# Patient Record
Sex: Female | Born: 1955 | Race: Black or African American | Hispanic: No | Marital: Married | Smoking: Former smoker
Health system: Southern US, Community
[De-identification: ages and names within clinical notes are randomized; demographics above are authoritative.]

## PROBLEM LIST (undated history)

## (undated) DIAGNOSIS — E669 Obesity, unspecified: Secondary | ICD-10-CM

## (undated) DIAGNOSIS — R9389 Abnormal findings on diagnostic imaging of other specified body structures: Secondary | ICD-10-CM

## (undated) DIAGNOSIS — I10 Essential (primary) hypertension: Secondary | ICD-10-CM

## (undated) DIAGNOSIS — E785 Hyperlipidemia, unspecified: Secondary | ICD-10-CM

## (undated) DIAGNOSIS — M1990 Unspecified osteoarthritis, unspecified site: Secondary | ICD-10-CM

## (undated) DIAGNOSIS — G500 Trigeminal neuralgia: Secondary | ICD-10-CM

## (undated) HISTORY — DX: Hyperlipidemia, unspecified: E78.5

## (undated) HISTORY — DX: Abnormal findings on diagnostic imaging of other specified body structures: R93.89

## (undated) HISTORY — DX: Trigeminal neuralgia: G50.0

## (undated) HISTORY — DX: Obesity, unspecified: E66.9

## (undated) HISTORY — DX: Essential (primary) hypertension: I10

## (undated) HISTORY — PX: ABDOMINAL HYSTERECTOMY: SHX81

## (undated) HISTORY — PX: COLONOSCOPY: SHX174

## (undated) HISTORY — DX: Unspecified osteoarthritis, unspecified site: M19.90

---

## 1998-09-04 ENCOUNTER — Emergency Department (HOSPITAL_COMMUNITY): Admission: EM | Admit: 1998-09-04 | Discharge: 1998-09-04 | Payer: Self-pay | Admitting: Emergency Medicine

## 1999-05-31 ENCOUNTER — Inpatient Hospital Stay (HOSPITAL_COMMUNITY): Admission: RE | Admit: 1999-05-31 | Discharge: 1999-06-02 | Payer: Self-pay | Admitting: Obstetrics and Gynecology

## 1999-05-31 ENCOUNTER — Encounter (INDEPENDENT_AMBULATORY_CARE_PROVIDER_SITE_OTHER): Payer: Self-pay

## 2003-08-02 ENCOUNTER — Encounter: Payer: Self-pay | Admitting: Internal Medicine

## 2003-08-02 ENCOUNTER — Ambulatory Visit (HOSPITAL_COMMUNITY): Admission: RE | Admit: 2003-08-02 | Discharge: 2003-08-02 | Payer: Self-pay | Admitting: Occupational Therapy

## 2004-06-20 ENCOUNTER — Ambulatory Visit (HOSPITAL_COMMUNITY): Admission: RE | Admit: 2004-06-20 | Discharge: 2004-06-20 | Payer: Self-pay | Admitting: Internal Medicine

## 2004-12-18 ENCOUNTER — Ambulatory Visit: Payer: Self-pay | Admitting: Family Medicine

## 2004-12-20 ENCOUNTER — Ambulatory Visit: Payer: Self-pay | Admitting: Family Medicine

## 2006-06-05 ENCOUNTER — Ambulatory Visit (HOSPITAL_COMMUNITY): Admission: RE | Admit: 2006-06-05 | Discharge: 2006-06-05 | Payer: Self-pay | Admitting: *Deleted

## 2010-02-08 ENCOUNTER — Encounter: Admission: RE | Admit: 2010-02-08 | Discharge: 2010-02-08 | Payer: Self-pay | Admitting: Emergency Medicine

## 2010-05-02 ENCOUNTER — Encounter: Admission: RE | Admit: 2010-05-02 | Discharge: 2010-05-02 | Payer: Self-pay | Admitting: Family Medicine

## 2011-08-06 LAB — BASIC METABOLIC PANEL
Creatinine: 0.7 mg/dL (ref <=1.1)
Potassium: 4.1 mmol/L (ref 3.4–5.3)

## 2011-08-06 LAB — LIPID PANEL
LDl/HDL Ratio: 2.7
Triglycerides: 77 mg/dL (ref 40–160)

## 2011-08-06 LAB — HEPATIC FUNCTION PANEL
AST: 19 U/L (ref 13–35)
Alkaline Phosphatase: 71 U/L (ref 25–125)
Bilirubin, Total: 0.7 mg/dL

## 2011-11-24 ENCOUNTER — Encounter: Payer: Self-pay | Admitting: *Deleted

## 2011-11-24 DIAGNOSIS — E785 Hyperlipidemia, unspecified: Secondary | ICD-10-CM

## 2011-11-24 DIAGNOSIS — E669 Obesity, unspecified: Secondary | ICD-10-CM

## 2011-11-24 DIAGNOSIS — I10 Essential (primary) hypertension: Secondary | ICD-10-CM

## 2011-11-26 ENCOUNTER — Encounter: Payer: Self-pay | Admitting: *Deleted

## 2011-12-11 ENCOUNTER — Telehealth: Payer: Self-pay | Admitting: Emergency Medicine

## 2011-12-11 NOTE — Telephone Encounter (Signed)
This encounter was created in error - please disregard.

## 2011-12-11 NOTE — Telephone Encounter (Signed)
Addended by: Morrell Riddle on: 12/11/2011 09:40 PM   Modules accepted: Level of Service, SmartSet

## 2011-12-18 ENCOUNTER — Ambulatory Visit (INDEPENDENT_AMBULATORY_CARE_PROVIDER_SITE_OTHER): Payer: 59 | Admitting: Emergency Medicine

## 2011-12-18 ENCOUNTER — Ambulatory Visit: Payer: 59

## 2011-12-18 ENCOUNTER — Encounter: Payer: Self-pay | Admitting: Emergency Medicine

## 2011-12-18 VITALS — BP 150/81 | HR 69 | Temp 98.1°F | Resp 16 | Ht 64.0 in | Wt 194.8 lb

## 2011-12-18 DIAGNOSIS — M549 Dorsalgia, unspecified: Secondary | ICD-10-CM

## 2011-12-18 DIAGNOSIS — E782 Mixed hyperlipidemia: Secondary | ICD-10-CM

## 2011-12-18 DIAGNOSIS — R9431 Abnormal electrocardiogram [ECG] [EKG]: Secondary | ICD-10-CM

## 2011-12-18 DIAGNOSIS — Z0000 Encounter for general adult medical examination without abnormal findings: Secondary | ICD-10-CM

## 2011-12-18 DIAGNOSIS — M545 Low back pain, unspecified: Secondary | ICD-10-CM

## 2011-12-18 DIAGNOSIS — E785 Hyperlipidemia, unspecified: Secondary | ICD-10-CM

## 2011-12-18 DIAGNOSIS — M47819 Spondylosis without myelopathy or radiculopathy, site unspecified: Secondary | ICD-10-CM

## 2011-12-18 DIAGNOSIS — I10 Essential (primary) hypertension: Secondary | ICD-10-CM

## 2011-12-18 LAB — COMPREHENSIVE METABOLIC PANEL
AST: 19 U/L (ref 0–37)
Albumin: 4.5 g/dL (ref 3.5–5.2)
BUN: 7 mg/dL (ref 6–23)
Calcium: 10 mg/dL (ref 8.4–10.5)
Chloride: 103 mEq/L (ref 96–112)
Potassium: 4.2 mEq/L (ref 3.5–5.3)
Sodium: 140 mEq/L (ref 135–145)
Total Protein: 7.3 g/dL (ref 6.0–8.3)

## 2011-12-18 LAB — CBC
HCT: 39.8 % (ref 36.0–46.0)
Hemoglobin: 12.8 g/dL (ref 12.0–15.0)
RDW: 14.4 % (ref 11.5–15.5)
WBC: 4.8 10*3/uL (ref 4.0–10.5)

## 2011-12-18 LAB — LIPID PANEL
HDL: 50 mg/dL (ref >39)
LDL Cholesterol: 153 mg/dL — ABNORMAL HIGH (ref 0–99)

## 2011-12-18 NOTE — Progress Notes (Signed)
  Subjective:    Patient ID: April Saunders, female    DOB: [birth year], 56 y.o.   MRN: [id number]  HPI patient presents for her yearly physical. 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 She has no other specific complaint     Objective:   Physical Exam her HEENT exam is within normal limits. Her neck supple. Her chest is clear to auscultation and percussion. Her cardiac exam reveals a regular rate and rhythm without murmurs rubs or gallops the her abdomen is soft there no tenderness or masses. Her back examination was tenderness over the lumbar spinet on but the tendon reflexes and motor strength is symmetrical her pulses in the lower extremities are normal. There is no swelling.Marland Kitchen   EKG was performed and showed a normal sinus rhythm with T. inversion in V1 to V3 no change from previous. 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     Assessment & Plan:  Patient has been out of her blood pressure medication last few days I suspect this is a reasonable pressure slightly elevated today. She does have T-wave changes in V1 to V3 and will refer to cardiology for evaluation of these abnormalities

## 2011-12-18 NOTE — Patient Instructions (Addendum)
Back Exercises Back exercises help treat and prevent back injuries. The goal of back exercises is to increase the strength of your abdominal and back muscles and the flexibility of your back. These exercises should be started when you no longer have back pain. Back exercises include:  Pelvic Tilt. Lie on your back with your knees bent. Tilt your pelvis until the lower part of your back is against the floor. Hold this position 5 to 10 sec and repeat 5 to 10 times.   Knee to Chest. Pull first 1 knee up against your chest and hold for 20 to 30 seconds, repeat this with the other knee, and then both knees. This may be done with the other leg straight or bent, whichever feels better.   Sit-Ups or Curl-Ups. Bend your knees 90 degrees. Start with tilting your pelvis, and do a partial, slow sit-up, lifting your trunk only 30 to 45 degrees off the floor. Take at least 2 to 3 seconds for each sit-up. Do not do sit-ups with your knees out straight. If partial sit-ups are difficult, simply do the above but with only tightening your abdominal muscles and holding it as directed.   Hip-Lift. Lie on your back with your knees flexed 90 degrees. Push down with your feet and shoulders as you raise your hips a couple inches off the floor; hold for 10 seconds, repeat 5 to 10 times.   Back arches. Lie on your stomach, propping yourself up on bent elbows. Slowly press on your hands, causing an arch in your low back. Repeat 3 to 5 times. Any initial stiffness and discomfort should lessen with repetition over time.   Shoulder-Lifts. Lie face down with arms beside your body. Keep hips and torso pressed to floor as you slowly lift your head and shoulders off the floor.  Do not overdo your exercises, especially in the beginning. Exercises may cause you some mild back discomfort which lasts for a few minutes; however, if the pain is more severe, or lasts for more than 15 minutes, do not continue exercises until you see your  caregiver. Improvement with exercise therapy for back problems is slow.  See your caregivers for assistance with developing a proper back exercise program. Document Released: 12/06/2004 Document Revised: 06/27/2011 Document Reviewed: 10/29/2005 [facility] Patient Information 2012 [place], [place].Hypertension As your heart beats, it forces blood through your arteries. This force is your blood pressure. If the pressure is too high, it is called hypertension (HTN) or high blood pressure. HTN is dangerous because you may have it and not know it. High blood pressure may mean that your heart has to work harder to pump blood. Your arteries may be narrow or stiff. The extra work puts you at risk for heart disease, stroke, and other problems.  Blood pressure consists of two numbers, a higher number over a lower, 110/72, for example. It is stated as "110 over 72." The ideal is below 120 for the top number (systolic) and under 80 for the bottom (diastolic). Write down your blood pressure today. You should pay close attention to your blood pressure if you have certain conditions such as:  Heart failure.   Prior heart attack.   Diabetes   Chronic kidney disease.   Prior stroke.   Multiple risk factors for heart disease.  To see if you have HTN, your blood pressure should be measured while you are seated with your arm held at the level of the heart. It should be measured at least twice.  A one-time elevated blood pressure reading (especially in the Emergency Department) does not mean that you need treatment. There may be conditions in which the blood pressure is different between your right and left arms. It is important to see your caregiver soon for a recheck. Most people have essential hypertension which means that there is not a specific cause. This type of high blood pressure may be lowered by changing lifestyle factors such as:  Stress.   Smoking.   Lack of exercise.   Excessive weight.    Drug/tobacco/alcohol use.   Eating less salt.  Most people do not have symptoms from high blood pressure until it has caused damage to the body. Effective treatment can often prevent, delay or reduce that damage. TREATMENT  When a cause has been identified, treatment for high blood pressure is directed at the cause. There are a large number of medications to treat HTN. These fall into several categories, and your caregiver will help you select the medicines that are best for you. Medications may have side effects. You should review side effects with your caregiver. If your blood pressure stays high after you have made lifestyle changes or started on medicines,   Your medication(s) may need to be changed.   Other problems may need to be addressed.   Be certain you understand your prescriptions, and know how and when to take your medicine.   Be sure to follow up with your caregiver within the time frame advised (usually within two weeks) to have your blood pressure rechecked and to review your medications.   If you are taking more than one medicine to lower your blood pressure, make sure you know how and at what times they should be taken. Taking two medicines at the same time can result in blood pressure that is too low.  SEEK IMMEDIATE MEDICAL CARE IF:  You develop a severe headache, blurred or changing vision, or confusion.   You have unusual weakness or numbness, or a faint feeling.   You have severe chest or abdominal pain, vomiting, or breathing problems.  MAKE SURE YOU:   Understand these instructions.   Will watch your condition.   Will get help right away if you are not doing well or get worse.  Document Released: 10/29/2005 Document Revised: 07/11/2011 Document Reviewed: 06/18/2008 [facility] Patient Information 2012 [place], [place].Cholesterol Cholesterol is a white, waxy, fat-like protein needed by your body in small amounts. The liver makes all the cholesterol you need.  It is carried from the liver by the blood through the blood vessels. Deposits (plaque) may build up on blood vessel walls. This makes the arteries narrower and stiffer. Plaque increases the risk for heart attack and stroke. You cannot feel your cholesterol level even if it is very high. The only way to know is by a blood test to check your lipid (fats) levels. Once you know your cholesterol levels, you should keep a record of the test results. Work with your caregiver to to keep your levels in the desired range. WHAT THE RESULTS MEAN:  Total cholesterol is a rough measure of all the cholesterol in your blood.   LDL is the so-called bad cholesterol. This is the type that deposits cholesterol in the walls of the arteries. You want this level to be low.   HDL is the good cholesterol because it cleans the arteries and carries the LDL away. You want this level to be high.   Triglycerides are fat that the body can either  burn for energy or store. High levels are closely linked to heart disease.  DESIRED LEVELS:  Total cholesterol below 200.   LDL below 100 for people at risk, below 70 for very high risk.   HDL above 50 is good, above 60 is best.   Triglycerides below 150.  HOW TO LOWER YOUR CHOLESTEROL:  Diet.   Choose fish or white meat chicken and [place], roasted or baked. Limit fatty cuts of red meat, fried foods, and processed meats, such as sausage and lunch meat.   Eat lots of fresh fruits and vegetables. Choose whole grains, beans, pasta, potatoes and cereals.   Use only small amounts of olive, corn or canola oils. Avoid butter, mayonnaise, shortening or palm kernel oils. Avoid foods with trans-fats.   Use skim/nonfat milk and low-fat/nonfat yogurt and cheeses. Avoid whole milk, cream, ice cream, egg yolks and cheeses. Healthy desserts include angel food cake, ginger snaps, animal crackers, hard candy, popsicles, and low-fat/nonfat frozen yogurt. Avoid pastries, cakes, pies and  cookies.   Exercise.   A regular program helps decrease LDL and raises HDL.   Helps with weight control.   Do things that increase your activity level like gardening, walking, or taking the stairs.   Medication.   May be prescribed by your caregiver to help lowering cholesterol and the risk for heart disease.   You may need medicine even if your levels are normal if you have several risk factors.  HOME CARE INSTRUCTIONS   Follow your diet and exercise programs as suggested by your caregiver.   Take medications as directed.   Have blood work done when your caregiver feels it is necessary.  MAKE SURE YOU:   Understand these instructions.   Will watch your condition.   Will get help right away if you are not doing well or get worse.  Document Released: 07/24/2001 Document Revised: 07/11/2011 Document Reviewed: 01/14/2008 [facility] Patient Information 2012 [place], [place].

## 2011-12-31 ENCOUNTER — Encounter: Payer: Self-pay | Admitting: Cardiovascular Disease

## 2011-12-31 ENCOUNTER — Ambulatory Visit (INDEPENDENT_AMBULATORY_CARE_PROVIDER_SITE_OTHER): Payer: 59 | Admitting: Cardiovascular Disease

## 2011-12-31 VITALS — BP 132/85 | HR 89 | Resp 12 | Ht 64.0 in | Wt 199.8 lb

## 2011-12-31 DIAGNOSIS — I10 Essential (primary) hypertension: Secondary | ICD-10-CM

## 2011-12-31 DIAGNOSIS — E785 Hyperlipidemia, unspecified: Secondary | ICD-10-CM

## 2011-12-31 DIAGNOSIS — R9431 Abnormal electrocardiogram [ECG] [EKG]: Secondary | ICD-10-CM | POA: Insufficient documentation

## 2011-12-31 MED ORDER — SIMVASTATIN 40 MG PO TABS: ORAL_TABLET | ORAL | Status: DC

## 2011-12-31 NOTE — Assessment & Plan Note (Signed)
Her current dose of Simvastatin is too high when used in combination with amlodipine.  We will decrease her dose to 20 mg .  She will follow up with Dr. Cleta Alberts.

## 2011-12-31 NOTE — Progress Notes (Signed)
    April Saunders Date of Birth  06-May-1956 [facility]     [place] Office  [address]    [address]   [address] [place], [place]  [address]    [place], [place]  [address] [phone]  Fax  [phone]  [phone]  Fax [phone]   History of Present Illness:  April Saunders is a 56 yo with a hx of HTN.  She has no cardiac complaints.  She was noticed to have an abnormal ECG and was referred over for further evaluation.  She denies any chest pain or dyspnea.  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  She is on her feet and moving 8 hours a day and has not cardiac limitations.   Current Outpatient Prescriptions  Medication Sig Dispense Refill  . amLODipine (NORVASC) 5 MG tablet Take 1 tablet (5 mg total) by mouth daily.  30 tablet  2  . Cod Liver Oil CAPS Take 1 capsule by mouth daily.      . fish oil-omega-3 fatty acids 1000 MG capsule Take 1 g by mouth daily.      . simvastatin (ZOCOR) 40 MG tablet One half tab every day  30 tablet       Allergies  Allergen Reactions  . Lisinopril Other (See Comments)    Shoulder pain    Past Medical History  Diagnosis Date  . Hypertension   . Obesity   . Hyperlipidemia   . Trigeminal neuralgia   . Lipoma     rt ankle  . DJD (degenerative joint disease)     multiple joints  . Abnormal MRI     brain    Past Surgical History  Procedure Date  . Abdominal hysterectomy     ? ovaries    History  Smoking status  . Former Smoker  Smokeless tobacco  . Not on file  Comment: 2008    History  Alcohol Use No    No family history on file.  Reviw of Systems:  Reviewed in the HPI.  All other systems are negative.  Physical Exam: Blood pressure 132/85, pulse 89, resp. rate 12, height 5\' 4"  (1.626 m), weight 199 lb 12.8 oz (90.629 kg). General: Well developed, well nourished, in no acute distress.  Head: Normocephalic, atraumatic, sclera non-icteric, mucus membranes are moist,   Neck: Supple. Negative  for carotid bruits. JVD not elevated.  Lungs: Clear bilaterally to auscultation without wheezes, rales, or rhonchi. Breathing is unlabored.  Heart: RRR with S1 S2. No murmurs, rubs, or gallops appreciated.  Abdomen: Soft, non-tender, non-distended with normoactive bowel sounds. No hepatomegaly. No rebound/guarding. No obvious abdominal masses.  Msk:  Strength and tone appear normal for age.  Extremities: No clubbing or cyanosis. No edema.  Distal pedal pulses are 2+ and equal bilaterally.  Neuro: Alert and oriented X 3. Moves all extremities spontaneously.  Psych:  Responds to questions appropriately with a normal affect.  ECG: NSR. Nonspecific T wave inversion in V1-V2 with T wave flattening in V3  Assessment / Plan:

## 2011-12-31 NOTE — Patient Instructions (Signed)
Decrease Simvastatin to 20mg  every day.  Your physician recommends that you schedule a follow-up appointment as needed.

## 2011-12-31 NOTE — Assessment & Plan Note (Signed)
April Saunders presents without any symptoms and was found to have non specific T wave changes on her resting ECG.  She has a hx of HTN and had not taken her meds for the several days prior to that ECG.    At this point I do not think any further testing is warrented.  She has no symptoms and does not really want to have any further testing.  I have asked her to walk every day and to call me if she develops any symptoms.

## 2012-03-18 ENCOUNTER — Other Ambulatory Visit: Payer: Self-pay | Admitting: Physician Assistant

## 2012-04-15 ENCOUNTER — Ambulatory Visit (INDEPENDENT_AMBULATORY_CARE_PROVIDER_SITE_OTHER): Payer: 59 | Admitting: Emergency Medicine

## 2012-04-15 VITALS — BP 137/88 | HR 80 | Temp 98.1°F | Resp 20 | Ht 64.0 in | Wt 194.6 lb

## 2012-04-15 DIAGNOSIS — E785 Hyperlipidemia, unspecified: Secondary | ICD-10-CM

## 2012-04-15 DIAGNOSIS — R059 Cough, unspecified: Secondary | ICD-10-CM

## 2012-04-15 DIAGNOSIS — R739 Hyperglycemia, unspecified: Secondary | ICD-10-CM

## 2012-04-15 DIAGNOSIS — I10 Essential (primary) hypertension: Secondary | ICD-10-CM

## 2012-04-15 DIAGNOSIS — R7989 Other specified abnormal findings of blood chemistry: Secondary | ICD-10-CM

## 2012-04-15 DIAGNOSIS — R05 Cough: Secondary | ICD-10-CM

## 2012-04-15 LAB — LIPID PANEL
LDL Cholesterol: 106 mg/dL — ABNORMAL HIGH (ref 0–99)
Triglycerides: 92 mg/dL (ref <150)

## 2012-04-15 LAB — GLUCOSE, POCT (MANUAL RESULT ENTRY): POC Glucose: 109 mg/dl — AB (ref 70–99)

## 2012-04-15 MED ORDER — BENZONATATE 200 MG PO CAPS: 200.0000 mg | ORAL_CAPSULE | Freq: Three times a day (TID) | ORAL | Status: AC | PRN

## 2012-04-15 NOTE — Patient Instructions (Signed)
Please take Claritin 10 mg one a day. Please pick up the [facility] and use for your cough. No change in medicines at present.

## 2012-04-15 NOTE — Progress Notes (Signed)
  Subjective:    Patient ID: April Saunders, female    DOB: 1955/12/31, 56 y.o.   MRN: [id number]  HPI April Saunders enters today for followup. She's been doing well without complaints except one week ago she developed a cold. She's had a dry persistent cough since that time. She has had no sputum production but has a raspy voice. She has no reflux complaints.    Review of Systems chest no chest pain shortness of breath or other new complaints.     Objective:   Physical Exam  Constitutional: She appears well-developed.  HENT:  Head: Normocephalic and atraumatic.  Eyes: Pupils are equal, round, and reactive to light.  Neck: Normal range of motion. No tracheal deviation present. No thyromegaly present.  Cardiovascular: Normal rate and regular rhythm.  Exam reveals no gallop and no friction rub.   No murmur heard. Pulmonary/Chest: Effort normal and breath sounds normal. No respiratory distress. She has no wheezes. She has no rales.  Lymphadenopathy:    She has no cervical adenopathy.          Assessment & Plan:  Patient is stable at present. No change in medications. We'll try Tessalon Perles and Claritin to treat her cough. She had no complaints of reflux the

## 2012-04-17 ENCOUNTER — Telehealth: Payer: Self-pay

## 2012-07-02 ENCOUNTER — Other Ambulatory Visit: Payer: Self-pay | Admitting: Family Medicine

## 2012-07-02 MED ORDER — AMLODIPINE BESYLATE 5 MG PO TABS: 5.0000 mg | ORAL_TABLET | Freq: Every day | ORAL | Status: DC

## 2012-08-19 ENCOUNTER — Ambulatory Visit: Payer: Self-pay | Admitting: Emergency Medicine

## 2012-08-19 ENCOUNTER — Ambulatory Visit: Payer: 59 | Admitting: Emergency Medicine

## 2012-09-02 ENCOUNTER — Ambulatory Visit: Payer: 59 | Admitting: Emergency Medicine

## 2012-09-09 ENCOUNTER — Other Ambulatory Visit: Payer: Self-pay | Admitting: *Deleted

## 2012-09-09 MED ORDER — AMLODIPINE BESYLATE 5 MG PO TABS: 5.0000 mg | ORAL_TABLET | Freq: Every day | ORAL | Status: DC

## 2012-09-16 ENCOUNTER — Ambulatory Visit: Payer: Self-pay | Admitting: Emergency Medicine

## 2012-10-17 ENCOUNTER — Other Ambulatory Visit: Payer: Self-pay | Admitting: Physician Assistant

## 2012-11-06 ENCOUNTER — Other Ambulatory Visit: Payer: Self-pay | Admitting: Emergency Medicine

## 2012-11-22 ENCOUNTER — Ambulatory Visit (INDEPENDENT_AMBULATORY_CARE_PROVIDER_SITE_OTHER): Payer: BC Managed Care – HMO | Admitting: Emergency Medicine

## 2012-11-22 VITALS — BP 155/82 | HR 86 | Temp 98.5°F | Resp 17 | Ht 64.5 in | Wt 195.0 lb

## 2012-11-22 DIAGNOSIS — E785 Hyperlipidemia, unspecified: Secondary | ICD-10-CM

## 2012-11-22 DIAGNOSIS — I10 Essential (primary) hypertension: Secondary | ICD-10-CM

## 2012-11-22 DIAGNOSIS — E782 Mixed hyperlipidemia: Secondary | ICD-10-CM

## 2012-11-22 DIAGNOSIS — Z23 Encounter for immunization: Secondary | ICD-10-CM

## 2012-11-22 LAB — POCT CBC
Lymph, poc: 2.4 (ref 0.6–3.4)
MCHC: 30.8 g/dL — AB (ref 31.8–35.4)
MID (cbc): 0.4 (ref 0–0.9)
MPV: 9 fL (ref 0–99.8)
POC Granulocyte: 3 (ref 2–6.9)
POC LYMPH PERCENT: 41.6 %L (ref 10–50)
POC MID %: 6.4 %M (ref 0–12)
RDW, POC: 15.4 %

## 2012-11-22 LAB — COMPREHENSIVE METABOLIC PANEL
ALT: 14 U/L (ref 0–35)
Alkaline Phosphatase: 83 U/L (ref 39–117)
Sodium: 138 mEq/L (ref 135–145)
Total Bilirubin: 0.9 mg/dL (ref 0.3–1.2)
Total Protein: 7.5 g/dL (ref 6.0–8.3)

## 2012-11-22 LAB — LIPID PANEL
LDL Cholesterol: 174 mg/dL — ABNORMAL HIGH (ref 0–99)
Total CHOL/HDL Ratio: 4.6 Ratio
Triglycerides: 132 mg/dL (ref <150)
VLDL: 26 mg/dL (ref 0–40)

## 2012-11-22 LAB — POCT UA - MICROSCOPIC ONLY
Bacteria, U Microscopic: NEGATIVE
Casts, Ur, LPF, POC: NEGATIVE
Mucus, UA: NEGATIVE

## 2012-11-22 LAB — POCT URINALYSIS DIPSTICK
Bilirubin, UA: NEGATIVE
Blood, UA: NEGATIVE
Glucose, UA: NEGATIVE
Spec Grav, UA: 1.015

## 2012-11-22 MED ORDER — AMLODIPINE BESYLATE 5 MG PO TABS: 5.0000 mg | ORAL_TABLET | Freq: Every day | ORAL | Status: DC

## 2012-11-22 MED ORDER — INFLUENZA VIRUS VACCINE SPLIT IM INJ: 0.5000 mL | INJECTION | Freq: Once | INTRAMUSCULAR | Status: DC

## 2012-11-22 NOTE — Progress Notes (Signed)
[facility] [address] [place] [address] [phone]  Date:  11/22/2012   Name:  April Saunders   DOB:  12/13/1955   MRN:  [id number]  PCP:  Lucilla Edin, MD    Chief Complaint: Medication Refill and Immunizations   History of Present Illness:  April Saunders is a 57 y.o. very pleasant female patient who presents with the following:  Here for refill on meds and labs.  Had mammogram done in December.  Has no acute health concerns currently other than need for influenza immunization.  Not due for colonoscopy until age 22.  Out of statin for two weeks took last dose of BP med last night.  Patient Active Problem List  Diagnosis  . Hypertension  . Obesity  . Hyperlipidemia  . Arthritis, low back  . Abnormal ECG    Past Medical History  Diagnosis Date  . Hypertension   . Obesity   . Hyperlipidemia   . Trigeminal neuralgia   . Lipoma     rt ankle  . DJD (degenerative joint disease)     multiple joints  . Abnormal MRI     brain    Past Surgical History  Procedure Date  . Abdominal hysterectomy     ? ovaries    History  Substance Use Topics  . Smoking status: Former Games developer  . Smokeless tobacco: Not on file     Comment: 2008  . Alcohol Use: No    No family history on file.  Allergies  Allergen Reactions  . Lisinopril Other (See Comments)    Shoulder pain    Medication list has been reviewed and updated.  Current Outpatient Prescriptions on File Prior to Visit  Medication Sig Dispense Refill  . amLODipine (NORVASC) 5 MG tablet Take 1 tablet (5 mg total) by mouth daily. Needs office visit/labs  30 tablet  0  . Cod Liver Oil CAPS Take 1 capsule by mouth daily.      . fish oil-omega-3 fatty acids 1000 MG capsule Take 1 g by mouth daily.      . simvastatin (ZOCOR) 40 MG tablet One half tab every day  30 tablet      Review of Systems:  As per HPI, otherwise negative.    Physical Examination: Filed Vitals:   11/22/12 1137  BP:  155/82  Pulse: 86  Temp: 98.5 F (36.9 C)  Resp: 17   Filed Vitals:   11/22/12 1137  Height: 5' 4.5" (1.638 m)  Weight: 195 lb (88.451 kg)   Body mass index is 32.95 kg/(m^2). Ideal Body Weight: Weight in (lb) to have BMI = 25: 147.6   GEN: obese, NAD, Non-toxic, A & O x 3 HEENT: Atraumatic, Normocephalic. Neck supple. No masses, No LAD. Ears and Nose: No external deformity. CV: RRR, No M/G/R. No JVD. No thrill. No extra heart sounds. PULM: CTA B, no wheezes, crackles, rhonchi. No retractions. No resp. distress. No accessory muscle use. ABD: S, NT, ND, +BS. No rebound. No HSM. EXTR: No c/c/e NEURO Normal gait.  PSYCH: Normally interactive. Conversant. Not depressed or anxious appearing.  Calm demeanor.    Assessment and Plan: Hypertension Hyperlipidemia Follow up after labs  Carmelina Dane, MD  Results for orders placed in visit on 11/22/12  POCT UA - MICROSCOPIC ONLY      Component Value Range   WBC, Ur, HPF, POC neg     RBC, urine, microscopic neg     Bacteria, U  Microscopic neg     Mucus, UA neg     Epithelial cells, urine per micros neg     Crystals, Ur, HPF, POC neg     Casts, Ur, LPF, POC neg     Yeast, UA neg    POCT URINALYSIS DIPSTICK      Component Value Range   Color, UA yellow     Clarity, UA clear     Glucose, UA neg     Bilirubin, UA neg     Ketones, UA neg     Spec Grav, UA 1.015     Blood, UA neg     pH, UA 7.0     Protein, UA neg     Urobilinogen, UA 0.2     Nitrite, UA neg     Leukocytes, UA Negative    POCT CBC      Component Value Range   WBC 5.7  4.6 - 10.2 K/uL   Lymph, poc 2.4  0.6 - 3.4   POC LYMPH PERCENT 41.6  10 - 50 %L   MID (cbc) 0.4  0 - 0.9   POC MID % 6.4  0 - 12 %M   POC Granulocyte 3.0  2 - 6.9   Granulocyte percent 52.0  37 - 80 %G   RBC 5.32  4.04 - 5.48 M/uL   Hemoglobin 13.6  12.2 - 16.2 g/dL   HCT, POC 09.8  11.9 - 47.9 %   MCV 83.1  80 - 97 fL   MCH, POC 25.6 (*) 27 - 31.2 pg   MCHC 30.8 (*) 31.8 -  35.4 g/dL   RDW, POC 14.7     Platelet Count, POC 321  142 - 424 K/uL   MPV 9.0  0 - 99.8 fL

## 2012-11-23 MED ORDER — SIMVASTATIN 40 MG PO TABS: ORAL_TABLET | ORAL | Status: DC

## 2012-11-23 NOTE — Addendum Note (Signed)
Addended by: Carmelina Dane on: 11/23/2012 01:42 PM   Modules accepted: Orders

## 2013-01-26 ENCOUNTER — Encounter: Payer: Self-pay | Admitting: Emergency Medicine

## 2013-03-09 ENCOUNTER — Ambulatory Visit (INDEPENDENT_AMBULATORY_CARE_PROVIDER_SITE_OTHER): Payer: BC Managed Care – HMO | Admitting: Physician Assistant

## 2013-03-09 VITALS — BP 157/88 | HR 73 | Temp 98.2°F | Resp 16 | Ht 64.5 in | Wt 198.8 lb

## 2013-03-09 DIAGNOSIS — M549 Dorsalgia, unspecified: Secondary | ICD-10-CM

## 2013-03-09 DIAGNOSIS — M25559 Pain in unspecified hip: Secondary | ICD-10-CM

## 2013-03-09 DIAGNOSIS — M25552 Pain in left hip: Secondary | ICD-10-CM

## 2013-03-09 MED ORDER — CYCLOBENZAPRINE HCL 5 MG PO TABS: 5.0000 mg | ORAL_TABLET | Freq: Every day | ORAL | Status: DC

## 2013-03-09 MED ORDER — MELOXICAM 15 MG PO TABS: 15.0000 mg | ORAL_TABLET | Freq: Every day | ORAL | Status: DC

## 2013-03-09 NOTE — Progress Notes (Signed)
   [address], [place] [address]   Phone [phone]  Subjective:    Patient ID: April Saunders, female    DOB: 1956/02/15, 57 y.o.   MRN: [id number]  HPI Pt presents to clinic with hip pain for the last 3 weeks.  She only has it at night when she rolls over in bed.  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  She did not have any injury but she thinks about 3 wks ago she might have stepped wrong going up a set of steps to her apartment and landed harder than normal but she does not specifically remember that.  She has pain when she lays down and has tried Aleve (2 pills) and gets no relief.  She does not have pain down her leg and has no paresthesias.  She has known arthritis in her back but her back has not been hurting more lately in her back.  The pain seems to be in her butt muscle.   Review of Systems  Musculoskeletal: Positive for back pain and arthralgias (L hip - at night only). Negative for gait problem.  Neurological: Negative for weakness and numbness.       Objective:   Physical Exam  Vitals reviewed. Constitutional: She is oriented to person, place, and time. She appears well-developed and well-nourished.  HENT:  Head: Normocephalic and atraumatic.  Right Ear: External ear normal.  Left Ear: External ear normal.  Pulmonary/Chest: Effort normal.  Musculoskeletal:       Right hip: She exhibits tenderness. She exhibits normal strength and no bony tenderness.       Lumbar back: She exhibits normal range of motion, no tenderness, no bony tenderness and no spasm.       Legs: Pt has pain with hip rotation.  She has pain with stressing of the SI joint.  Neurological: She is alert and oriented to person, place, and time. She has normal strength and normal reflexes. No sensory deficit.  Reflex Scores:      Patellar reflexes are 2+ on the right side and 2+ on the left side.      Achilles  reflexes are 2+ on the right side and 2+ on the left side. Skin: Skin is warm and dry.  Psychiatric: She has a normal mood and affect. Her behavior is normal. Judgment and thought content normal.          Assessment & Plan:  Hip pain, acute, left - I think this might be related to position of standing during the day and her arthritis in her lumbar spine - I wonder if she is having mild sciatica vs SI joint irritation.  Pt will use heat on her back.  She will do stretches. Plan: meloxicam (MOBIC) 15 MG tablet, cyclobenzaprine (FLEXERIL) 5 MG tablet  Back pain -- long standing.  If no improvement we might want to think about sacroiliitis but due to pain only at night and only on 1 side it is less likely.    Benny Lennert PA-C 03/09/2013 9:05 PM

## 2013-04-09 IMAGING — CR DG LUMBAR SPINE 2-3V
2 series · 2 of 2 positions shown · non-contrast
Comparison: Lumbar MRI 05/02/2010.

CLINICAL DATA: Back pain.

LUMBAR SPINE - 2-3 VIEW

[left lateral]
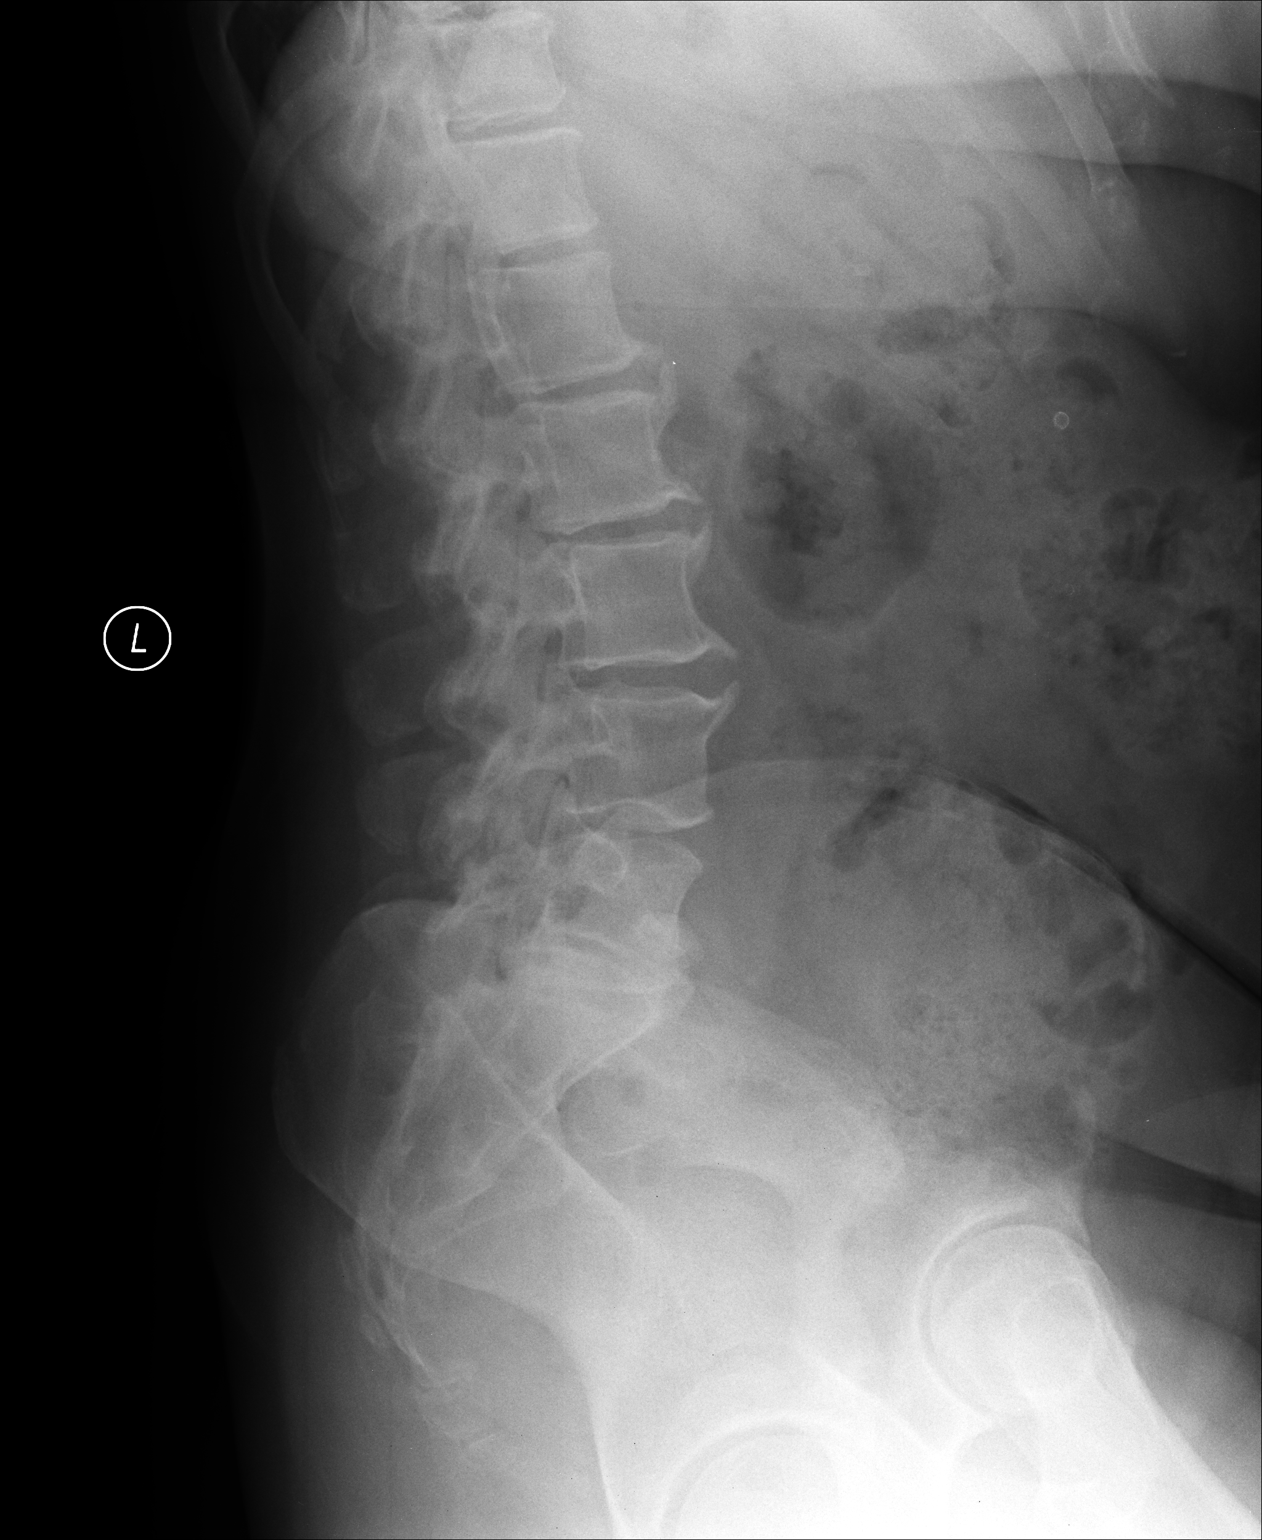

[AP]
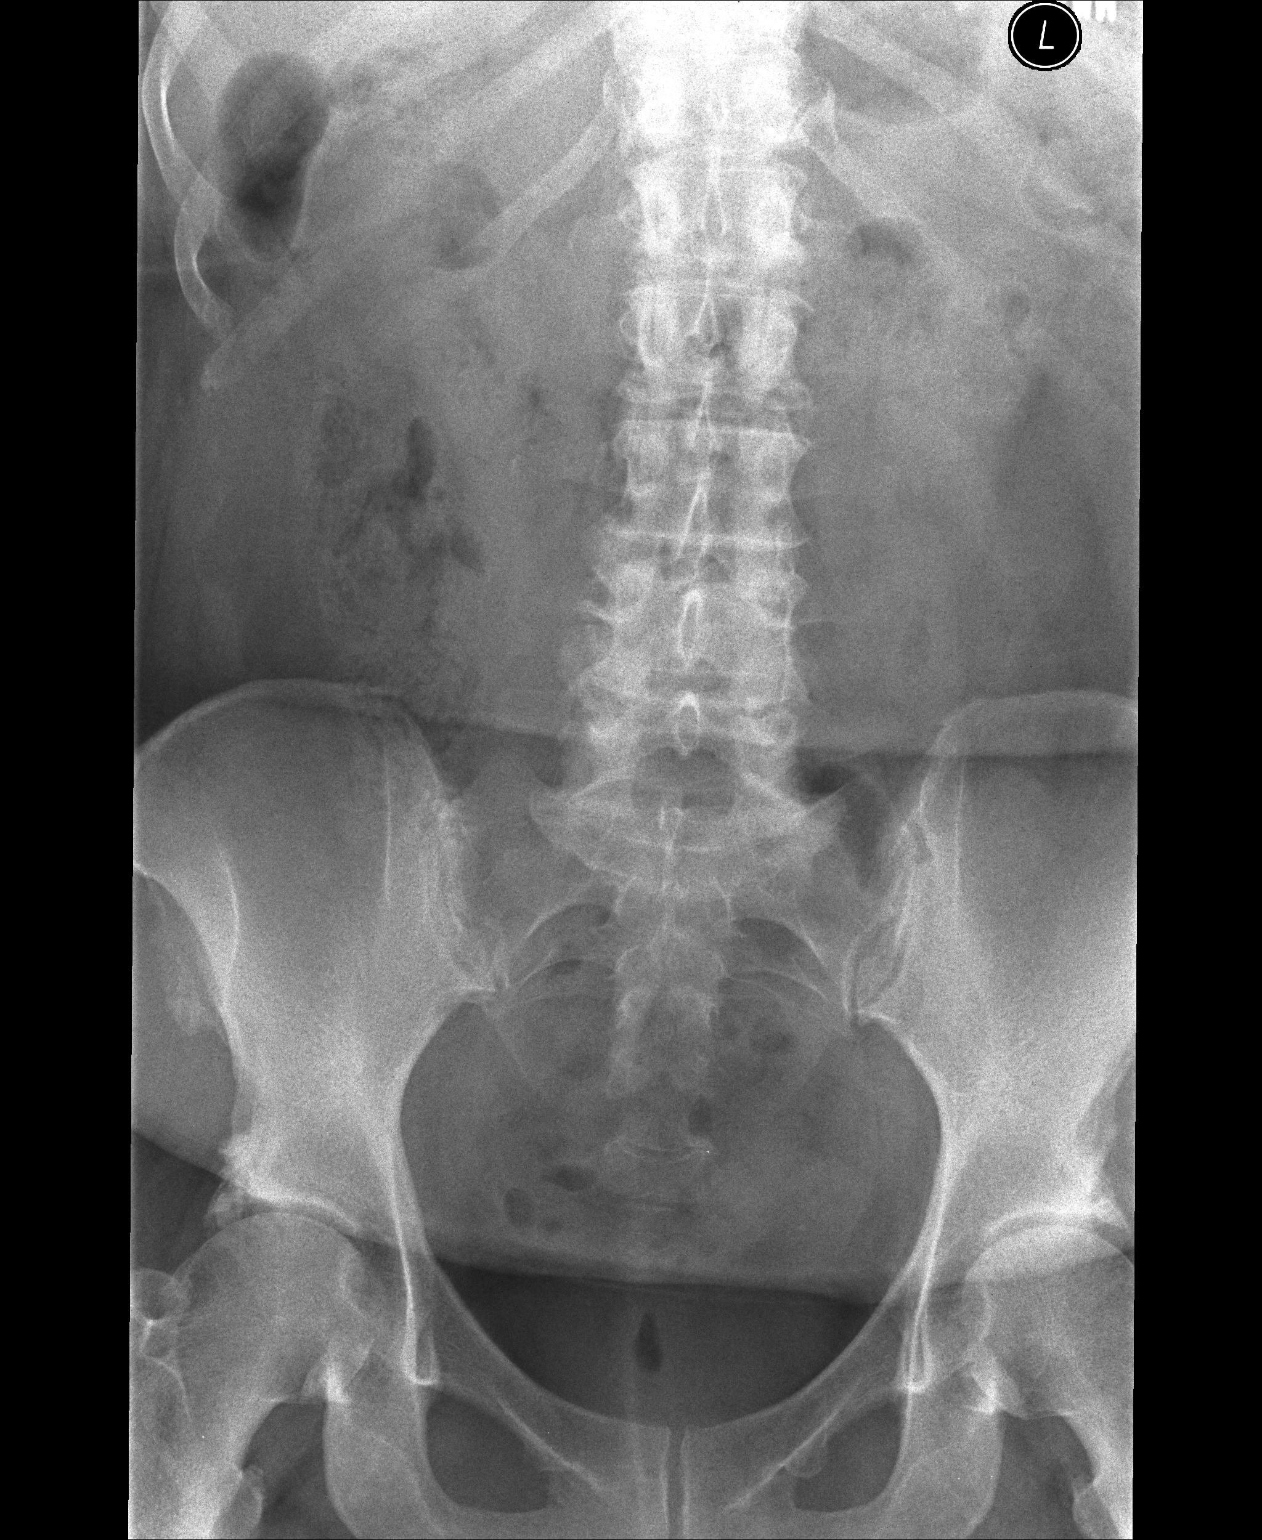

[2 of 2 positions shown; findings below may reference images not displayed]

FINDINGS: There is mild rotation on the lateral view.  There are
five lumbar type vertebral bodies which remain in normal alignment.
Disc space loss at L5-S1 and intervertebral spurring throughout the
lumbar spine are stable.  Mild facet hypertrophy is present
inferiorly.  There is no evidence of acute fracture or pars defect.
IMPRESSION: Stable lumbar spondylosis with disc space loss at L5-S1.  No acute
osseous findings or malalignment.

## 2013-06-05 ENCOUNTER — Other Ambulatory Visit: Payer: Self-pay | Admitting: Emergency Medicine

## 2013-06-14 ENCOUNTER — Ambulatory Visit (INDEPENDENT_AMBULATORY_CARE_PROVIDER_SITE_OTHER): Payer: 59 | Admitting: Internal Medicine

## 2013-06-14 ENCOUNTER — Ambulatory Visit: Payer: 59

## 2013-06-14 VITALS — BP 162/100 | HR 84 | Temp 98.1°F | Resp 16 | Ht 64.0 in | Wt 198.8 lb

## 2013-06-14 DIAGNOSIS — Z79899 Other long term (current) drug therapy: Secondary | ICD-10-CM

## 2013-06-14 DIAGNOSIS — M549 Dorsalgia, unspecified: Secondary | ICD-10-CM

## 2013-06-14 DIAGNOSIS — I10 Essential (primary) hypertension: Secondary | ICD-10-CM

## 2013-06-14 DIAGNOSIS — E785 Hyperlipidemia, unspecified: Secondary | ICD-10-CM

## 2013-06-14 LAB — POCT UA - MICROSCOPIC ONLY: Crystals, Ur, HPF, POC: NEGATIVE

## 2013-06-14 LAB — POCT URINALYSIS DIPSTICK
Blood, UA: NEGATIVE
Glucose, UA: NEGATIVE
Ketones, UA: NEGATIVE
Spec Grav, UA: >=1.03

## 2013-06-14 LAB — BASIC METABOLIC PANEL
BUN: 9 mg/dL (ref 6–23)
Calcium: 10 mg/dL (ref 8.4–10.5)
Chloride: 104 mEq/L (ref 96–112)
Creat: 0.74 mg/dL (ref 0.50–1.10)

## 2013-06-14 MED ORDER — SIMVASTATIN 40 MG PO TABS: ORAL_TABLET | ORAL | Status: DC

## 2013-06-14 MED ORDER — TRAMADOL HCL 50 MG PO TABS: 50.0000 mg | ORAL_TABLET | Freq: Three times a day (TID) | ORAL | Status: DC | PRN

## 2013-06-14 MED ORDER — METHOCARBAMOL 750 MG PO TABS: 750.0000 mg | ORAL_TABLET | Freq: Four times a day (QID) | ORAL | Status: DC

## 2013-06-14 MED ORDER — AMLODIPINE BESYLATE 5 MG PO TABS: 5.0000 mg | ORAL_TABLET | Freq: Every day | ORAL | Status: DC

## 2013-06-14 NOTE — Patient Instructions (Signed)
DASH Diet  The DASH diet stands for "Dietary Approaches to Stop Hypertension." It is a healthy eating plan that has been shown to reduce high blood pressure (hypertension) in as little as 14 days, while also possibly providing other significant health benefits. These other health benefits include reducing the risk of breast cancer after menopause and reducing the risk of type 2 diabetes, heart disease, colon cancer, and stroke. Health benefits also include weight loss and slowing kidney failure in patients with chronic kidney disease.   DIET GUIDELINES  · Limit salt (sodium). Your diet should contain less than 1500 mg of sodium daily.  · Limit refined or processed carbohydrates. Your diet should include mostly whole grains. Desserts and added sugars should be used sparingly.  · Include small amounts of heart-healthy fats. These types of fats include nuts, oils, and tub margarine. Limit saturated and trans fats. These fats have been shown to be harmful in the body.  CHOOSING FOODS   The following food groups are based on a 2000 calorie diet. See your Registered Dietitian for individual calorie needs.  Grains and Grain Products (6 to 8 servings daily)  · Eat More Often: Whole-wheat bread, brown rice, whole-grain or wheat pasta, quinoa, popcorn without added fat or salt (air popped).  · Eat Less Often: White bread, white pasta, white rice, cornbread.  Vegetables (4 to 5 servings daily)  · Eat More Often: Fresh, frozen, and canned vegetables. Vegetables may be raw, steamed, roasted, or grilled with a minimal amount of fat.  · Eat Less Often/Avoid: Creamed or fried vegetables. Vegetables in a cheese sauce.  Fruit (4 to 5 servings daily)  · Eat More Often: All fresh, canned (in natural juice), or frozen fruits. Dried fruits without added sugar. One hundred percent fruit juice (½ cup [237 mL] daily).  · Eat Less Often: Dried fruits with added sugar. Canned fruit in light or heavy syrup.  Lean Meats, Fish, and Poultry (2  servings or less daily. One serving is 3 to 4 oz [85-114 g]).  · Eat More Often: Ninety percent or leaner ground beef, tenderloin, sirloin. Round cuts of beef, chicken breast, turkey breast. All fish. Grill, bake, or broil your meat. Nothing should be fried.  · Eat Less Often/Avoid: Fatty cuts of meat, turkey, or chicken leg, thigh, or wing. Fried cuts of meat or fish.  Dairy (2 to 3 servings)  · Eat More Often: Low-fat or fat-free milk, low-fat plain or light yogurt, reduced-fat or part-skim cheese.  · Eat Less Often/Avoid: Milk (whole, 2%). Whole milk yogurt. Full-fat cheeses.  Nuts, Seeds, and Legumes (4 to 5 servings per week)  · Eat More Often: All without added salt.  · Eat Less Often/Avoid: Salted nuts and seeds, canned beans with added salt.  Fats and Sweets (limited)  · Eat More Often: Vegetable oils, tub margarines without trans fats, sugar-free gelatin. Mayonnaise and salad dressings.  · Eat Less Often/Avoid: Coconut oils, palm oils, butter, stick margarine, cream, half and half, cookies, candy, pie.  FOR MORE INFORMATION  The Dash Diet Eating Plan: www.dashdiet.org  Document Released: 10/18/2011 Document Revised: 01/21/2012 Document Reviewed: 10/18/2011  ExitCare® Patient Information ©2014 [employer].

## 2013-06-14 NOTE — Progress Notes (Signed)
  Subjective:    Patient ID: April Saunders, female    DOB: 11-Aug-1956, 57 y.o.   MRN: [id number]  HPI    Review of Systems     Objective:   Physical Exam        Assessment & Plan:

## 2013-06-14 NOTE — Progress Notes (Signed)
  Subjective:    Patient ID: April Saunders, female    DOB: Apr 27, 1956, 57 y.o.   MRN: [id number]  HPI 57 YO female patient presents today for the following:  1. Medication refills- Pt states she has been out of her blood pressure medication for the past week. She is also out of her cholesterol medication.   2. She complains of thoracic back pain that hurts constantly. She does not recall an injury. It started about 4-5 months ago. Denies the pain radiates down her legs. It is hard for her to lay flat and difficult for her to sleep. She took Mobic and Flexeril last night and it helped her sleep, but this morning the pain has returned.  3. She also states that her left hip hurts while laying down at night. In the morning she has to baby her hip so she has full ROM after being up for a little bit. She states her hip does not cause her pain during the day. No injury that she can recall. The Mobic and Flexeril did not help with the pain last night in her hip.    She has proven arthritis and DDD of spine MRI 2011 Review of Systems     Objective:   Physical Exam  Vitals reviewed. Constitutional: She is oriented to person, place, and time. She appears well-nourished. She appears distressed.  HENT:  Nose: Nose normal.  Eyes: EOM are normal.  Neck: Neck supple.  Cardiovascular: Normal rate, regular rhythm and normal heart sounds.   Pulmonary/Chest: Effort normal and breath sounds normal.  Abdominal: Soft. There is no tenderness.  Musculoskeletal: She exhibits tenderness.       Lumbar back: She exhibits decreased range of motion, bony tenderness, pain and spasm. She exhibits no edema and no deformity.       Back:  Spine tender palpate Left sacrum pain at nite  Neurological: She is alert and oriented to person, place, and time. Coordination normal.  Psychiatric: She has a normal mood and affect. Her behavior is normal.   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 Regino Schultze Robaxin/Tylenol/Tramadol prn

## 2013-06-16 ENCOUNTER — Encounter: Payer: Self-pay | Admitting: *Deleted

## 2013-07-27 ENCOUNTER — Other Ambulatory Visit: Payer: Self-pay | Admitting: Physician Assistant

## 2013-07-28 ENCOUNTER — Other Ambulatory Visit: Payer: Self-pay | Admitting: Physician Assistant

## 2013-10-01 NOTE — Telephone Encounter (Signed)
error 

## 2013-11-16 ENCOUNTER — Other Ambulatory Visit: Payer: Self-pay | Admitting: Physician Assistant

## 2013-12-10 ENCOUNTER — Encounter: Payer: Self-pay | Admitting: Emergency Medicine

## 2014-07-20 ENCOUNTER — Ambulatory Visit: Payer: 59 | Admitting: Emergency Medicine

## 2014-07-22 ENCOUNTER — Encounter: Payer: Self-pay | Admitting: Emergency Medicine

## 2014-07-22 ENCOUNTER — Ambulatory Visit (INDEPENDENT_AMBULATORY_CARE_PROVIDER_SITE_OTHER): Payer: 59 | Admitting: Emergency Medicine

## 2014-07-22 VITALS — BP 140/81 | HR 70 | Temp 97.8°F | Resp 16 | Ht 64.0 in | Wt 181.0 lb

## 2014-07-22 DIAGNOSIS — Z23 Encounter for immunization: Secondary | ICD-10-CM

## 2014-07-22 DIAGNOSIS — M545 Low back pain, unspecified: Secondary | ICD-10-CM

## 2014-07-22 DIAGNOSIS — I10 Essential (primary) hypertension: Secondary | ICD-10-CM

## 2014-07-22 DIAGNOSIS — E785 Hyperlipidemia, unspecified: Secondary | ICD-10-CM

## 2014-07-22 DIAGNOSIS — Z79899 Other long term (current) drug therapy: Secondary | ICD-10-CM

## 2014-07-22 LAB — CBC WITH DIFFERENTIAL/PLATELET
BASOS PCT: 1 % (ref 0–1)
Basophils Absolute: 0 10*3/uL (ref 0.0–0.1)
EOS ABS: 0.1 10*3/uL (ref 0.0–0.7)
Eosinophils Relative: 2 % (ref 0–5)
HCT: 39.7 % (ref 36.0–46.0)
Hemoglobin: 13.3 g/dL (ref 12.0–15.0)
Lymphocytes Relative: 45 % (ref 12–46)
Lymphs Abs: 2 10*3/uL (ref 0.7–4.0)
MCH: 25.9 pg — AB (ref 26.0–34.0)
MCHC: 33.5 g/dL (ref 30.0–36.0)
MCV: 77.4 fL — ABNORMAL LOW (ref 78.0–100.0)
Monocytes Absolute: 0.4 10*3/uL (ref 0.1–1.0)
Monocytes Relative: 8 % (ref 3–12)
NEUTROS PCT: 44 % (ref 43–77)
Neutro Abs: 1.9 10*3/uL (ref 1.7–7.7)
PLATELETS: 260 10*3/uL (ref 150–400)
RBC: 5.13 MIL/uL — ABNORMAL HIGH (ref 3.87–5.11)
RDW: 14.8 % (ref 11.5–15.5)
WBC: 4.4 10*3/uL (ref 4.0–10.5)

## 2014-07-22 MED ORDER — SIMVASTATIN 20 MG PO TABS: 20.0000 mg | ORAL_TABLET | Freq: Every day | ORAL | Status: DC

## 2014-07-22 MED ORDER — AMLODIPINE BESYLATE 5 MG PO TABS: 5.0000 mg | ORAL_TABLET | Freq: Every day | ORAL | Status: DC

## 2014-07-22 NOTE — Progress Notes (Signed)
   Subjective:    Patient ID: April Saunders, female    DOB: 1956-08-12, 58 y.o.   MRN: [id number]  This chart was scribed for April Russian, MD by Edison Simon, ED Scribe. This patient was seen in room 21 and the patient's care was started at 11:31 AM.   HPI  HPI Comments: April Saunders is a 58 y.o. female who presents to the [facility] for follow up and med refill. She complains of continued waning and waxing back pain, rated at 8 or 9 out of 10. She denies other complaints. 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 She denies any pain or weakness in legs. She reports recent colonoscopy and mammogram. She reports recent intentional weight loss.  Prior to Admission medications   Medication Sig Start Date End Date Taking? Authorizing Provider  amLODipine (NORVASC) 5 MG tablet Take 1 tablet (5 mg total) by mouth daily. PATIENT NEEDS OFFICE VISIT FOR ADDITIONAL REFILLS 06/14/13  Yes Orma Flaming, MD  [facility] Liver Oil CAPS Take 1 capsule by mouth daily.   Yes Historical Provider, MD  cyclobenzaprine (FLEXERIL) 5 MG tablet Take 1-2 tablets (5-10 mg total) by mouth at bedtime. 03/09/13  Yes Mancel Bale, PA-C  fish oil-omega-3 fatty acids 1000 MG capsule Take 1 g by mouth daily.   Yes Historical Provider, MD  meloxicam (MOBIC) 15 MG tablet Take 1 tablet (15 mg total) by mouth daily. 03/09/13  Yes Mancel Bale, PA-C  methocarbamol (ROBAXIN-750) 750 MG tablet Take 1 tablet (750 mg total) by mouth 4 (four) times daily. 06/14/13  Yes Orma Flaming, MD  simvastatin (ZOCOR) 40 MG tablet One tablet daily 06/14/13  Yes Orma Flaming, MD  traMADol (ULTRAM) 50 MG tablet Take 1 tablet (50 mg total) by mouth every 8 (eight) hours as needed for pain. 06/14/13  Yes Orma Flaming, MD   Review of Systems  Musculoskeletal: Positive for back pain.       Denies leg pain, denies leg weakness  Neurological: Negative for weakness.       Objective:   Physical Exam  Nursing note and vitals  reviewed. Constitutional: She is oriented to person, place, and time. She appears well-developed and well-nourished. No distress.  HENT:  Head: Normocephalic and atraumatic.  Eyes: Conjunctivae and EOM are normal.  Neck: Normal range of motion. Neck supple.  Pulmonary/Chest: Effort normal.  Musculoskeletal: Normal range of motion.  Tenderness over lower thoracic and upper lumbar spine, decreased flexion rotation extension  Neurological: She is alert and oriented to person, place, and time. She has normal reflexes.   no focal weakness, no numbness in lower extremities, DTRs 1+ and symmetrical  Skin: Skin is warm and dry.  Psychiatric: She has a normal mood and affect.          Assessment & Plan:  Routine labs were done, flu shot given, blood pressure medicine and cholesterol medicines refilled, referal made to orthopedics for evaluation of her back pain.  I personally performed the services described in this documentation, which was scribed in my presence. The recorded information has been reviewed and is accurate.

## 2014-07-23 LAB — LIPID PANEL
CHOL/HDL RATIO: 3.9 ratio
Cholesterol: 225 mg/dL — ABNORMAL HIGH (ref 0–200)
HDL: 57 mg/dL (ref >39)
LDL CALC: 151 mg/dL — AB (ref 0–99)
Triglycerides: 84 mg/dL (ref <150)
VLDL: 17 mg/dL (ref 0–40)

## 2014-07-23 LAB — COMPLETE METABOLIC PANEL WITH GFR
ALK PHOS: 79 U/L (ref 39–117)
ALT: 13 U/L (ref 0–35)
AST: 19 U/L (ref 0–37)
Albumin: 4.4 g/dL (ref 3.5–5.2)
BILIRUBIN TOTAL: 0.6 mg/dL (ref 0.2–1.2)
BUN: 10 mg/dL (ref 6–23)
CO2: 27 mEq/L (ref 19–32)
Calcium: 10 mg/dL (ref 8.4–10.5)
Chloride: 103 mEq/L (ref 96–112)
Creat: 0.75 mg/dL (ref 0.50–1.10)
GFR, EST NON AFRICAN AMERICAN: 89 mL/min
GFR, Est African American: >89 mL/min
Glucose, Bld: 90 mg/dL (ref 70–99)
Potassium: 4.2 mEq/L (ref 3.5–5.3)
SODIUM: 140 meq/L (ref 135–145)
TOTAL PROTEIN: 7.4 g/dL (ref 6.0–8.3)

## 2014-08-04 ENCOUNTER — Encounter: Payer: Self-pay | Admitting: Radiology

## 2014-08-10 ENCOUNTER — Telehealth: Payer: Self-pay

## 2014-08-10 NOTE — Telephone Encounter (Signed)
Pt states she was seen a couple of weeks ago and Dr Everlene Farrier told her that he was calling in Rx for back pain,but her pharmacy states there is no rx   Best phone for pt is [facility] [place]

## 2014-08-10 NOTE — Telephone Encounter (Signed)
LM for pt to call [facility]- referral notes state pt has been contacted by them numerous times to schedule an appt. Asked her to RTC if needs pain medication.

## 2014-08-13 ENCOUNTER — Telehealth: Payer: Self-pay

## 2014-08-13 NOTE — Telephone Encounter (Signed)
Lm on work number.

## 2014-08-13 NOTE — Telephone Encounter (Signed)
Pt called back in regards to her lab results.  Please contact her on her home or work phone.  [phone] or [phone]

## 2014-08-13 NOTE — Telephone Encounter (Signed)
No answer on home phone.

## 2014-08-16 ENCOUNTER — Other Ambulatory Visit: Payer: Self-pay | Admitting: *Deleted

## 2014-08-16 ENCOUNTER — Other Ambulatory Visit: Payer: Self-pay | Admitting: Emergency Medicine

## 2014-08-16 MED ORDER — ATORVASTATIN CALCIUM 40 MG PO TABS: 40.0000 mg | ORAL_TABLET | Freq: Every day | ORAL | Status: DC

## 2014-08-16 NOTE — Telephone Encounter (Signed)
No answer on home phone. LM on work [employer]

## 2014-08-16 NOTE — Telephone Encounter (Signed)
Pt called about lab results.  Lab results given and rx for lipitor sent into pharmacy.

## 2014-11-22 ENCOUNTER — Encounter: Payer: Self-pay | Admitting: Emergency Medicine

## 2014-11-25 ENCOUNTER — Encounter: Payer: Self-pay | Admitting: Emergency Medicine

## 2014-11-25 ENCOUNTER — Ambulatory Visit (INDEPENDENT_AMBULATORY_CARE_PROVIDER_SITE_OTHER): Payer: 59 | Admitting: Emergency Medicine

## 2014-11-25 VITALS — BP 140/78 | HR 61 | Temp 98.1°F | Resp 16 | Ht 64.0 in | Wt 182.0 lb

## 2014-11-25 DIAGNOSIS — M545 Low back pain, unspecified: Secondary | ICD-10-CM

## 2014-11-25 DIAGNOSIS — I10 Essential (primary) hypertension: Secondary | ICD-10-CM

## 2014-11-25 DIAGNOSIS — R21 Rash and other nonspecific skin eruption: Secondary | ICD-10-CM

## 2014-11-25 DIAGNOSIS — E785 Hyperlipidemia, unspecified: Secondary | ICD-10-CM

## 2014-11-25 LAB — CBC WITH DIFFERENTIAL/PLATELET
BASOS ABS: 0 10*3/uL (ref 0.0–0.1)
BASOS PCT: 1 % (ref 0–1)
EOS ABS: 0.1 10*3/uL (ref 0.0–0.7)
Eosinophils Relative: 2 % (ref 0–5)
HEMATOCRIT: 40.2 % (ref 36.0–46.0)
Hemoglobin: 13.2 g/dL (ref 12.0–15.0)
Lymphocytes Relative: 42 % (ref 12–46)
Lymphs Abs: 1.6 10*3/uL (ref 0.7–4.0)
MCH: 26.3 pg (ref 26.0–34.0)
MCHC: 32.8 g/dL (ref 30.0–36.0)
MCV: 80.2 fL (ref 78.0–100.0)
MONO ABS: 0.2 10*3/uL (ref 0.1–1.0)
MONOS PCT: 6 % (ref 3–12)
MPV: 10.1 fL (ref 8.6–12.4)
Neutro Abs: 1.9 10*3/uL (ref 1.7–7.7)
Neutrophils Relative %: 49 % (ref 43–77)
Platelets: 289 10*3/uL (ref 150–400)
RBC: 5.01 MIL/uL (ref 3.87–5.11)
RDW: 14.3 % (ref 11.5–15.5)
WBC: 3.8 10*3/uL — ABNORMAL LOW (ref 4.0–10.5)

## 2014-11-25 LAB — LIPID PANEL
CHOL/HDL RATIO: 3.1 ratio
Cholesterol: 166 mg/dL (ref 0–200)
HDL: 54 mg/dL (ref >39)
LDL Cholesterol: 100 mg/dL — ABNORMAL HIGH (ref 0–99)
Triglycerides: 60 mg/dL (ref <150)
VLDL: 12 mg/dL (ref 0–40)

## 2014-11-25 LAB — COMPLETE METABOLIC PANEL WITH GFR
ALT: 16 U/L (ref 0–35)
AST: 19 U/L (ref 0–37)
Albumin: 4 g/dL (ref 3.5–5.2)
Alkaline Phosphatase: 84 U/L (ref 39–117)
BUN: 10 mg/dL (ref 6–23)
CALCIUM: 9.8 mg/dL (ref 8.4–10.5)
CO2: 27 mEq/L (ref 19–32)
Chloride: 105 mEq/L (ref 96–112)
Creat: 0.69 mg/dL (ref 0.50–1.10)
GFR, Est Non African American: >89 mL/min
Glucose, Bld: 103 mg/dL — ABNORMAL HIGH (ref 70–99)
POTASSIUM: 4.3 meq/L (ref 3.5–5.3)
SODIUM: 140 meq/L (ref 135–145)
Total Bilirubin: 0.7 mg/dL (ref 0.2–1.2)
Total Protein: 7.3 g/dL (ref 6.0–8.3)

## 2014-11-25 LAB — POCT SKIN KOH: SKIN KOH, POC: POSITIVE

## 2014-11-25 NOTE — Progress Notes (Signed)
   Subjective:  This chart was scribed for April Russian, MD by Tamsen Roers, at [facility].  This patient was seen in room 21 and the patient's care was started at 8:25 AM.   Patient ID: April Saunders, female    DOB: 1956-04-09, 59 y.o.   MRN: [id number]  HPI  HPI Comments: April Saunders is a 60 y.o. female who presents to [facility] for a follow up.   Back pain- patient notes her back is still the same and she has not seen an orthopedist.  She notes she called to make an appointment but they asked for Dr's notes here so she could never set up a visit.   Cholesterol- Patient notes she did not have money to buy her new cholesterol medicine so she has not been taking her medication for 4 months. She continued using her old cholesterol medication.  Patient is currently on Simvastatin. Skin: She also notes of a slightly scaly area on her right anterior chest.   She is up to date on her colonoscopy and mammogram which she got last year.Patient received her flu shot last year.  Patient denies chest pain and shortness of breath.          Review of Systems  Constitutional: Negative for fatigue and unexpected weight change.  Respiratory: Negative for chest tightness and shortness of breath.   Cardiovascular: Negative for chest pain, palpitations and leg swelling.  Gastrointestinal: Negative for abdominal pain and blood in stool.  Neurological: Negative for dizziness, syncope, light-headedness and headaches.       Objective:   Physical Exam  CONSTITUTIONAL: Well developed/well nourished HEAD: Normocephalic/atraumatic EYES: EOMI/PERRL ENMT: Mucous membranes moist NECK: supple no meningeal signs SPINE/BACK:entire spine nontender CV: S1/S2 noted, no murmurs/rubs/gallops noted LUNGS: Lungs are clear to auscultation bilaterally, no apparent distress ABDOMEN: soft, nontender, no rebound or guarding, bowel sounds noted throughout abdomen GU:no  cva tenderness NEURO: Pt is awake/alert/appropriate, moves all extremitiesx4.  No facial droop.   EXTREMITIES: pulses normal/equal, full ROM, 1+ edema SKIN: there is a 3 by 3 inch pigmented slightly scaly area right anterior chest just above the right breast.  PSYCH: no abnormalities of mood noted, alert and oriented to situation   Filed Vitals:   11/25/14 0813  BP: 140/78  Pulse: 61  Temp: 98.1 F (36.7 C)  TempSrc: Oral  Resp: 16  Height: 5\' 4"  (1.626 m)  Weight: 182 lb (82.555 kg)  SpO2: 100%        Assessment & Plan:  KOH is positive and will treat this abnormal area with Lotrimin. She has not been taking the Lipitor and instead has been taking her old prescription for Zocor. She is still on amlodipine. She states she is up-to-date on her mammograms and colonoscopies.I personally performed the services described in this documentation, which was scribed in my presence. The recorded information has been reviewed and is accurate.

## 2015-05-26 ENCOUNTER — Ambulatory Visit: Payer: 59 | Admitting: Emergency Medicine

## 2015-08-16 ENCOUNTER — Encounter: Payer: Self-pay | Admitting: Emergency Medicine

## 2015-09-05 ENCOUNTER — Other Ambulatory Visit: Payer: Self-pay | Admitting: Physician Assistant

## 2015-09-05 ENCOUNTER — Ambulatory Visit (INDEPENDENT_AMBULATORY_CARE_PROVIDER_SITE_OTHER): Payer: 59 | Admitting: Physician Assistant

## 2015-09-05 VITALS — BP 118/80 | HR 77 | Temp 98.0°F | Resp 16 | Ht 63.75 in | Wt 180.4 lb

## 2015-09-05 DIAGNOSIS — M47819 Spondylosis without myelopathy or radiculopathy, site unspecified: Secondary | ICD-10-CM

## 2015-09-05 DIAGNOSIS — Z23 Encounter for immunization: Secondary | ICD-10-CM | POA: Diagnosis not present

## 2015-09-05 DIAGNOSIS — M545 Low back pain, unspecified: Secondary | ICD-10-CM

## 2015-09-05 DIAGNOSIS — I10 Essential (primary) hypertension: Secondary | ICD-10-CM | POA: Diagnosis not present

## 2015-09-05 DIAGNOSIS — Z1159 Encounter for screening for other viral diseases: Secondary | ICD-10-CM | POA: Diagnosis not present

## 2015-09-05 DIAGNOSIS — E785 Hyperlipidemia, unspecified: Secondary | ICD-10-CM | POA: Diagnosis not present

## 2015-09-05 DIAGNOSIS — Z114 Encounter for screening for human immunodeficiency virus [HIV]: Secondary | ICD-10-CM | POA: Diagnosis not present

## 2015-09-05 LAB — CBC WITH DIFFERENTIAL/PLATELET
BASOS PCT: 0 % (ref 0–1)
Basophils Absolute: 0 10*3/uL (ref 0.0–0.1)
EOS ABS: 0.1 10*3/uL (ref 0.0–0.7)
EOS PCT: 2 % (ref 0–5)
HCT: 39.7 % (ref 36.0–46.0)
HEMOGLOBIN: 13.4 g/dL (ref 12.0–15.0)
Lymphocytes Relative: 39 % (ref 12–46)
Lymphs Abs: 1.8 10*3/uL (ref 0.7–4.0)
MCH: 26.9 pg (ref 26.0–34.0)
MCHC: 33.8 g/dL (ref 30.0–36.0)
MCV: 79.7 fL (ref 78.0–100.0)
MONO ABS: 0.3 10*3/uL (ref 0.1–1.0)
MONOS PCT: 6 % (ref 3–12)
MPV: 9.6 fL (ref 8.6–12.4)
NEUTROS ABS: 2.5 10*3/uL (ref 1.7–7.7)
Neutrophils Relative %: 53 % (ref 43–77)
Platelets: 261 10*3/uL (ref 150–400)
RBC: 4.98 MIL/uL (ref 3.87–5.11)
RDW: 14.5 % (ref 11.5–15.5)
WBC: 4.7 10*3/uL (ref 4.0–10.5)

## 2015-09-05 LAB — COMPREHENSIVE METABOLIC PANEL
ALBUMIN: 4.3 g/dL (ref 3.6–5.1)
ALT: 18 U/L (ref 6–29)
AST: 19 U/L (ref 10–35)
Alkaline Phosphatase: 81 U/L (ref 33–130)
BUN: 11 mg/dL (ref 7–25)
CHLORIDE: 105 mmol/L (ref 98–110)
CO2: 26 mmol/L (ref 20–31)
CREATININE: 0.8 mg/dL (ref 0.50–1.05)
Calcium: 9.9 mg/dL (ref 8.6–10.4)
GLUCOSE: 117 mg/dL — AB (ref 65–99)
Potassium: 3.9 mmol/L (ref 3.5–5.3)
SODIUM: 140 mmol/L (ref 135–146)
Total Bilirubin: 0.8 mg/dL (ref 0.2–1.2)
Total Protein: 7.2 g/dL (ref 6.1–8.1)

## 2015-09-05 LAB — LIPID PANEL
Cholesterol: 164 mg/dL (ref 125–200)
HDL: 59 mg/dL (ref >=46)
LDL CALC: 88 mg/dL (ref <130)
TRIGLYCERIDES: 86 mg/dL (ref <150)
Total CHOL/HDL Ratio: 2.8 Ratio (ref <=5.0)
VLDL: 17 mg/dL (ref <30)

## 2015-09-05 LAB — HEPATITIS C ANTIBODY: HCV Ab: NEGATIVE

## 2015-09-05 LAB — HIV ANTIBODY (ROUTINE TESTING W REFLEX): HIV 1&2 Ab, 4th Generation: NONREACTIVE

## 2015-09-05 LAB — TSH: TSH: 1.424 u[IU]/mL (ref 0.350–4.500)

## 2015-09-05 MED ORDER — CYCLOBENZAPRINE HCL 5 MG PO TABS: 5.0000 mg | ORAL_TABLET | Freq: Every day | ORAL | Status: DC

## 2015-09-05 MED ORDER — AMLODIPINE BESYLATE 5 MG PO TABS: 5.0000 mg | ORAL_TABLET | Freq: Every day | ORAL | Status: DC

## 2015-09-05 MED ORDER — TRAMADOL HCL 50 MG PO TABS: 50.0000 mg | ORAL_TABLET | Freq: Three times a day (TID) | ORAL | Status: DC | PRN

## 2015-09-05 MED ORDER — MELOXICAM 15 MG PO TABS: 15.0000 mg | ORAL_TABLET | Freq: Every day | ORAL | Status: DC

## 2015-09-05 MED ORDER — SIMVASTATIN 20 MG PO TABS: 20.0000 mg | ORAL_TABLET | Freq: Every day | ORAL | Status: DC

## 2015-09-05 NOTE — Patient Instructions (Signed)
I will contact you with your lab results as soon as they are available.   If you have not heard from me in 2 weeks, please contact me.  The fastest way to get your results is to register for My Chart (see the instructions on the last page of this printout).   

## 2015-09-05 NOTE — Progress Notes (Signed)
Patient ID: April Saunders, female    DOB: [birth year], 59 y.o.   MRN: [id number]  PCP: Jenny Reichmann, MD  Subjective:   Chief Complaint  Patient presents with  . Medication Refill    Amlodipine 5 mg, Atorvastatin 40 mg, Tramadol 50 mg  . Immunizations    Flu injection    HPI Presents for medication refills.  Overall, she feels well. No problems or concerns.  No CP, SOB, HA, dizziness, Nausea, vomiting, melena, hematochezia. Intermittent low back pain. She takes meloxicam, cyclobenzaprine and tramadol PRN, but she's out.    Review of Systems  Constitutional: Negative.   HENT: Negative.   Eyes: Negative.   Respiratory: Negative.   Cardiovascular: Negative.   Gastrointestinal: Negative.   Endocrine: Negative.   Genitourinary: Negative.   Musculoskeletal: Positive for back pain. Negative for myalgias, joint swelling, arthralgias, gait problem, neck pain and neck stiffness.  Skin: Negative.   Allergic/Immunologic: Negative.   Neurological: Negative.   Hematological: Negative.   Psychiatric/Behavioral: Negative.        Patient Active Problem List   Diagnosis Date Noted  . Abnormal ECG 12/31/2011  . Arthritis, low back 12/18/2011  . Hypertension   . Obesity   . Hyperlipidemia      Prior to Admission medications   Medication Sig Start Date End Date Taking? Authorizing Provider  amLODipine (NORVASC) 5 MG tablet Take 1 tablet (5 mg total) by mouth daily. 07/22/14  Yes [name], MD  [facility] Liver Oil CAPS Take 1 capsule by mouth daily.   Yes Historical Provider, MD  fish oil-omega-3 fatty acids 1000 MG capsule Take 1 g by mouth daily.   Yes Historical Provider, MD  simvastatin (ZOCOR) 20 MG tablet Take 20 mg by mouth daily. 06/22/15  Yes Historical Provider, MD  cyclobenzaprine (FLEXERIL) 5 MG tablet Take 1-2 tablets (5-10 mg total) by mouth at bedtime. Patient not taking: Reported on 11/25/2014 03/09/13   Mancel Bale, PA-C  meloxicam (MOBIC) 15 MG tablet Take 1  tablet (15 mg total) by mouth daily. Patient not taking: Reported on 11/25/2014 03/09/13   Mancel Bale, PA-C  traMADol (ULTRAM) 50 MG tablet Take 1 tablet (50 mg total) by mouth every 8 (eight) hours as needed for pain. Patient not taking: Reported on 11/25/2014 06/14/13   Orma Flaming, MD     Allergies  Allergen Reactions  . Lisinopril Other (See Comments)    Shoulder pain       Objective:  Physical Exam  Constitutional: She is oriented to person, place, and time. Vital signs are normal. She appears well-developed and well-nourished. She is active and cooperative. No distress.  BP 118/80 mmHg  Pulse 77  Temp(Src) 98 F (36.7 C) (Oral)  Resp 16  Ht 5' 3.75" (1.619 m)  Wt 180 lb 6.4 oz (81.829 kg)  BMI 31.22 kg/m2  SpO2 98%  HENT:  Head: Normocephalic and atraumatic.  Right Ear: Hearing normal.  Left Ear: Hearing normal.  Eyes: Conjunctivae are normal. No scleral icterus.  Neck: Normal range of motion. Neck supple. No thyromegaly present.  Cardiovascular: Normal rate, regular rhythm and normal heart sounds.   Pulses:      Radial pulses are 2+ on the right side, and 2+ on the left side.  Pulmonary/Chest: Effort normal and breath sounds normal.  Lymphadenopathy:       Head (right side): No tonsillar, no preauricular, no posterior auricular and no occipital adenopathy present.  Head (left side): No tonsillar, no preauricular, no posterior auricular and no occipital adenopathy present.    She has no cervical adenopathy.       Right: No supraclavicular adenopathy present.       Left: No supraclavicular adenopathy present.  Neurological: She is alert and oriented to person, place, and time. No sensory deficit.  Skin: Skin is warm, dry and intact. No rash noted. No cyanosis or erythema. Nails show no clubbing.  Psychiatric: She has a normal mood and affect. Her speech is normal and behavior is normal.           Assessment & Plan:   1. Essential  hypertension Controlled. Continue current treatment.  - CBC with Differential/Platelet - Comprehensive metabolic panel - TSH - amLODipine (NORVASC) 5 MG tablet; Take 1 tablet (5 mg total) by mouth daily.  Dispense: 90 tablet; Refill: 3  2. Hyperlipidemia Await lab results. Continue current treatment, adjust if indicated by labs. - Lipid panel - simvastatin (ZOCOR) 20 MG tablet; Take 1 tablet (20 mg total) by mouth daily.  Dispense: 90 tablet; Refill: 3  3. Need for influenza vaccination - Flu Vaccine QUAD 36+ mos IM  4. Screening for HIV (human immunodeficiency virus) - HIV antibody  5. Need for hepatitis C screening test - Hepatitis C antibody  6. Bilateral low back pain without sciatica Stable. Intermittent pain. Continue prn treatment. - traMADol (ULTRAM) 50 MG tablet; Take 1 tablet (50 mg total) by mouth every 8 (eight) hours as needed.  Dispense: 30 tablet; Refill: 0 - meloxicam (MOBIC) 15 MG tablet; Take 1 tablet (15 mg total) by mouth daily.  Dispense: 30 tablet; Refill: 0 - cyclobenzaprine (FLEXERIL) 5 MG tablet; Take 1-2 tablets (5-10 mg total) by mouth at bedtime.  Dispense: 30 tablet; Refill: 0  Return in about 6 months (around 03/05/2016) for re-evaluation of blood pressure and cholesterol.   Fara Chute, PA-C Physician Assistant-Certified [facility]

## 2015-09-07 LAB — HEMOGLOBIN A1C
Hgb A1c MFr Bld: 6.4 % — ABNORMAL HIGH (ref <5.7)
Mean Plasma Glucose: 137 mg/dL — ABNORMAL HIGH (ref <117)

## 2015-09-11 ENCOUNTER — Telehealth: Payer: Self-pay | Admitting: Family Medicine

## 2015-09-11 NOTE — Telephone Encounter (Signed)
Called [facility] to check status on HgA1C that was added. The result was 6.4, will try to resend to see if it will cross over and will also fax results.

## 2015-09-12 ENCOUNTER — Encounter: Payer: Self-pay | Admitting: Physician Assistant

## 2015-09-12 NOTE — Telephone Encounter (Signed)
Yes, please.

## 2015-09-12 NOTE — Telephone Encounter (Signed)
Chelle, do you want me to put in a ticket to have the duplicate orders removed.

## 2015-09-13 NOTE — Telephone Encounter (Signed)
Done

## 2015-09-14 NOTE — Progress Notes (Signed)
Order(s) created erroneously. Erroneous order ID: [id number]  Order moved by: Milas Hock  Order move date/time: 09/14/2015 3:55 PM  Source Patient: [id number]  Source Contact: 09/05/2015  Destination Patient: [id number]  Destination Contact: 01/27/2013

## 2015-09-14 NOTE — Progress Notes (Signed)
Order(s) created erroneously. Erroneous order ID: [id number]  Order moved by: Milas Hock  Order move date/time: 09/14/2015 3:58 PM  Source Patient: [id number]  Source Contact: 09/05/2015  Destination Patient: [id number]  Destination Contact: 01/27/2013  Erroneous order ID: [id number]  Order moved by: Milas Hock  Order move date/time: 09/14/2015 3:58 PM  Source Patient: [id number]  Source Contact: 09/05/2015  Destination Patient: [id number]  Destination Contact: 01/27/2013  Erroneous order ID: [id number]  Order moved by: Milas Hock  Order move date/time: 09/14/2015 3:58 PM  Source Patient: [id number]  Source Contact: 09/05/2015  Destination Patient: [id number]  Destination Contact: 01/27/2013  Erroneous order ID: [id number]  Order moved by: Milas Hock  Order move date/time: 09/14/2015 3:58 PM  Source Patient: [id number]  Source Contact: 09/05/2015  Destination Patient: [id number]  Destination Contact: 01/27/2013  Erroneous order ID: [id number]  Order moved by: Milas Hock  Order move date/time: 09/14/2015 3:58 PM  Source Patient: [id number]  Source Contact: 09/05/2015  Destination Patient: [id number]  Destination Contact: 01/27/2013

## 2015-09-29 ENCOUNTER — Encounter: Payer: Self-pay | Admitting: Physician Assistant

## 2015-09-29 DIAGNOSIS — R739 Hyperglycemia, unspecified: Secondary | ICD-10-CM | POA: Insufficient documentation

## 2015-10-05 ENCOUNTER — Encounter: Payer: Self-pay | Admitting: *Deleted

## 2015-11-15 LAB — HM MAMMOGRAPHY

## 2015-12-02 ENCOUNTER — Encounter: Payer: Self-pay | Admitting: Family Medicine

## 2016-04-02 ENCOUNTER — Other Ambulatory Visit: Payer: Self-pay | Admitting: Emergency Medicine

## 2016-10-19 ENCOUNTER — Other Ambulatory Visit: Payer: Self-pay | Admitting: Physician Assistant

## 2016-10-19 DIAGNOSIS — I10 Essential (primary) hypertension: Secondary | ICD-10-CM

## 2016-10-19 DIAGNOSIS — E785 Hyperlipidemia, unspecified: Secondary | ICD-10-CM

## 2016-10-20 ENCOUNTER — Other Ambulatory Visit: Payer: Self-pay | Admitting: Physician Assistant

## 2016-10-20 DIAGNOSIS — I10 Essential (primary) hypertension: Secondary | ICD-10-CM

## 2016-10-20 DIAGNOSIS — E785 Hyperlipidemia, unspecified: Secondary | ICD-10-CM

## 2016-10-20 NOTE — Telephone Encounter (Signed)
No office visit in greater than a year called patient, both numbers are disconnected, can you approve a 2 week supply for her ? Or should this be declined at the pharmacy for office visit?

## 2016-10-20 NOTE — Telephone Encounter (Signed)
Meds ordered this encounter  Medications  . simvastatin (ZOCOR) 20 MG tablet    Sig: TAKE 1 TABLET(20 MG) BY MOUTH DAILY    Dispense:  30 tablet    Refill:  0    Need office visit for additional refills.  Marland Kitchen amLODipine (NORVASC) 5 MG tablet    Sig: TAKE 1 TABLET(5 MG) BY MOUTH DAILY    Dispense:  30 tablet    Refill:  0    Need office visit for additional refills.

## 2016-11-15 LAB — HM MAMMOGRAPHY

## 2018-02-17 ENCOUNTER — Encounter: Payer: Self-pay | Admitting: Physician Assistant

## 2018-12-06 LAB — HM MAMMOGRAPHY

## 2018-12-10 ENCOUNTER — Encounter: Payer: Self-pay | Admitting: *Deleted

## 2018-12-29 ENCOUNTER — Ambulatory Visit: Payer: 59 | Admitting: Emergency Medicine

## 2018-12-30 ENCOUNTER — Other Ambulatory Visit: Payer: Self-pay

## 2018-12-30 ENCOUNTER — Ambulatory Visit: Payer: 59 | Admitting: Emergency Medicine

## 2018-12-30 ENCOUNTER — Encounter: Payer: Self-pay | Admitting: Emergency Medicine

## 2018-12-30 VITALS — BP 176/82 | HR 79 | Temp 98.0°F | Resp 16 | Ht 64.57 in | Wt 185.0 lb

## 2018-12-30 DIAGNOSIS — E785 Hyperlipidemia, unspecified: Secondary | ICD-10-CM | POA: Diagnosis not present

## 2018-12-30 DIAGNOSIS — I10 Essential (primary) hypertension: Secondary | ICD-10-CM

## 2018-12-30 LAB — CBC WITH DIFFERENTIAL/PLATELET
BASOS ABS: 0 10*3/uL (ref 0.0–0.2)
Basos: 1 %
EOS (ABSOLUTE): 0.1 10*3/uL (ref 0.0–0.4)
EOS: 2 %
HEMATOCRIT: 37.9 % (ref 34.0–46.6)
Hemoglobin: 12.5 g/dL (ref 11.1–15.9)
IMMATURE GRANULOCYTES: 0 %
Immature Grans (Abs): 0 10*3/uL (ref 0.0–0.1)
Lymphocytes Absolute: 1.8 10*3/uL (ref 0.7–3.1)
Lymphs: 42 %
MCH: 27 pg (ref 26.6–33.0)
MCHC: 33 g/dL (ref 31.5–35.7)
MCV: 82 fL (ref 79–97)
MONOS ABS: 0.3 10*3/uL (ref 0.1–0.9)
Monocytes: 7 %
NEUTROS PCT: 48 %
Neutrophils Absolute: 2.1 10*3/uL (ref 1.4–7.0)
PLATELETS: 253 10*3/uL (ref 150–450)
RBC: 4.63 x10E6/uL (ref 3.77–5.28)
RDW: 13.6 % (ref 11.7–15.4)
WBC: 4.4 10*3/uL (ref 3.4–10.8)

## 2018-12-30 LAB — COMPREHENSIVE METABOLIC PANEL
ALK PHOS: 86 IU/L (ref 39–117)
ALT: 12 IU/L (ref 0–32)
AST: 20 IU/L (ref 0–40)
Albumin/Globulin Ratio: 1.9 (ref 1.2–2.2)
Albumin: 4.5 g/dL (ref 3.8–4.8)
BUN/Creatinine Ratio: 16 (ref 12–28)
BUN: 12 mg/dL (ref 8–27)
Bilirubin Total: 0.4 mg/dL (ref 0.0–1.2)
CALCIUM: 9.6 mg/dL (ref 8.7–10.3)
CO2: 23 mmol/L (ref 20–29)
CREATININE: 0.76 mg/dL (ref 0.57–1.00)
Chloride: 104 mmol/L (ref 96–106)
GFR calc Af Amer: 98 mL/min/{1.73_m2} (ref >59)
GFR, EST NON AFRICAN AMERICAN: 85 mL/min/{1.73_m2} (ref >59)
GLUCOSE: 103 mg/dL — AB (ref 65–99)
Globulin, Total: 2.4 g/dL (ref 1.5–4.5)
Potassium: 4.3 mmol/L (ref 3.5–5.2)
Sodium: 140 mmol/L (ref 134–144)
Total Protein: 6.9 g/dL (ref 6.0–8.5)

## 2018-12-30 LAB — LIPID PANEL
CHOL/HDL RATIO: 3.9 ratio (ref 0.0–4.4)
CHOLESTEROL TOTAL: 222 mg/dL — AB (ref 100–199)
HDL: 57 mg/dL (ref >39)
LDL CALC: 149 mg/dL — AB (ref 0–99)
Triglycerides: 78 mg/dL (ref 0–149)
VLDL Cholesterol Cal: 16 mg/dL (ref 5–40)

## 2018-12-30 MED ORDER — SIMVASTATIN 20 MG PO TABS: 20.0000 mg | ORAL_TABLET | Freq: Every day | ORAL | 3 refills | Status: DC

## 2018-12-30 MED ORDER — AMLODIPINE BESYLATE 5 MG PO TABS: 5.0000 mg | ORAL_TABLET | Freq: Every day | ORAL | 3 refills | Status: DC

## 2018-12-30 NOTE — Progress Notes (Addendum)
BP Readings from Last 3 Encounters:  12/30/18 (!) 176/82  09/05/15 118/80  11/25/14 140/78   Lab Results  Component Value Date   CHOL 164 09/05/2015   HDL 59 09/05/2015   LDLCALC 88 09/05/2015   TRIG 86 09/05/2015   CHOLHDL 2.8 09/05/2015   April Saunders 63 y.o.   Chief Complaint  Patient presents with  . Establish Care    needs a PCP to manage chronic conditions, pt states she has not had any B/P or HLD for 2 years     HISTORY OF PRESENT ILLNESS: This is a 63 y.o. female with history of hypertension and dyslipidemia but off medications for the past 2 years at least.  Here to establish care and follow-up.  Asymptomatic.  Has no complaints or medical concerns today.   The 10-year ASCVD risk score Mikey Bussing DC Brooke Bonito., et al., 2013) is: 18.3%   Values used to calculate the score:     Age: 59 years     Sex: Female     Is Non-Hispanic African American: Yes     Diabetic: No     Tobacco smoker: No     Systolic Blood Pressure: 527 mmHg     Is BP treated: Yes     HDL Cholesterol: 57 mg/dL     Total Cholesterol: 222 mg/dL  HPI   Prior to Admission medications   Medication Sig Start Date End Date Taking? Authorizing Provider  amLODipine (NORVASC) 5 MG tablet TAKE 1 TABLET(5 MG) BY MOUTH DAILY 10/20/16  Yes Jeffery, Perkins, PA  Cod Liver Oil CAPS Take 1 capsule by mouth daily.   Yes [provider]  fish oil-omega-3 fatty acids 1000 MG capsule Take 1 g by mouth daily.   Yes [provider]  simvastatin (ZOCOR) 20 MG tablet TAKE 1 TABLET(20 MG) BY MOUTH DAILY 10/20/16  Yes Harrison Mons, PA    Allergies  Allergen Reactions  . Atorvastatin Calcium Itching  . Lipitor [Atorvastatin]     Weakness   . Lisinopril Other (See Comments)    Shoulder pain    Patient Active Problem List   Diagnosis Date Noted  . Hyperglycemia 09/29/2015  . Abnormal ECG 12/31/2011  . Arthritis, low back 12/18/2011  . Hypertension   . Obesity   . Hyperlipidemia     Past Medical  History:  Diagnosis Date  . Abnormal MRI    brain  . DJD (degenerative joint disease)    multiple joints  . Hyperlipidemia   . Hypertension   . Lipoma    rt ankle  . Obesity   . Trigeminal neuralgia     Past Surgical History:  Procedure Laterality Date  . ABDOMINAL HYSTERECTOMY     ? ovaries    Social History   Socioeconomic History  . Marital status: Married    Spouse name: Syd Manges  . Number of children: 3  . Years of education: 11th grade  . Highest education level: Not on file  Occupational History  . 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 Financial resource strain: Not on file  . Food insecurity:    Worry: Not on file    Inability: Not on file  . Transportation needs:    Medical: Not on file    Non-medical: Not on file  Tobacco Use  . Smoking status: Former Research scientist (life sciences)  . Smokeless tobacco: Never Used  . Tobacco comment: 2008  Substance and Sexual Activity  . Alcohol  use: No    Alcohol/week: 0.0 standard drinks  . Drug use: No  . Sexual activity: Yes    Partners: Male    Birth control/protection: Surgical  Lifestyle  . Physical activity:    Days per week: Not on file    Minutes per session: Not on file  . Stress: Not on file  Relationships  . Social connections:    Talks on phone: Not on file    Gets together: Not on file    Attends religious service: Not on file    Active member of club or organization: Not on file    Attends meetings of clubs or organizations: Not on file    Relationship status: Not on file  . Intimate partner violence:    Fear of current or ex partner: Not on file    Emotionally abused: Not on file    Physically abused: Not on file    Forced sexual activity: Not on file  Other Topics Concern  . Not on file  Social History Narrative   Lives with her husband. Three adult child live independently and nearby.    Family History  Problem Relation Age of Onset  . Diabetes Mellitus I Mother   .  Hypertension Brother   . Diabetes Mellitus I Brother      Review of Systems  Constitutional: Negative.  Negative for chills and fever.  HENT: Negative.  Negative for congestion, hearing loss and nosebleeds.   Eyes: Negative.  Negative for blurred vision and double vision.  Respiratory: Negative.  Negative for cough, hemoptysis and shortness of breath.   Cardiovascular: Negative.  Negative for chest pain, palpitations and leg swelling.  Gastrointestinal: Negative.  Negative for abdominal pain, diarrhea, nausea and vomiting.  Genitourinary: Negative.   Musculoskeletal: Negative.  Negative for back pain, myalgias and neck pain.  Skin: Negative.   Neurological: Negative.  Negative for dizziness and headaches.  Endo/Heme/Allergies: Negative.   All other systems reviewed and are negative.  Vitals:   12/30/18 1020  BP: (!) 176/82  Pulse: 79  Resp: 16  Temp: 98 F (36.7 C)  SpO2: 98%     Physical Exam Vitals signs reviewed.  Constitutional:      Appearance: Normal appearance.  HENT:     Head: Normocephalic and atraumatic.     Nose: Nose normal.     Mouth/Throat:     Mouth: Mucous membranes are moist.     Pharynx: Oropharynx is clear.  Eyes:     Extraocular Movements: Extraocular movements intact.     Conjunctiva/sclera: Conjunctivae normal.     Pupils: Pupils are equal, round, and reactive to light.  Neck:     Musculoskeletal: Normal range of motion and neck supple.  Cardiovascular:     Rate and Rhythm: Normal rate and regular rhythm.     Pulses: Normal pulses.     Heart sounds: Normal heart sounds.  Pulmonary:     Effort: Pulmonary effort is normal.     Breath sounds: Normal breath sounds.  Musculoskeletal: Normal range of motion.  Skin:    General: Skin is warm and dry.  Neurological:     General: No focal deficit present.     Mental Status: She is alert and oriented to person, place, and time.  Psychiatric:        Mood and Affect: Mood normal.        Behavior:  Behavior normal.      ASSESSMENT & PLAN: Hypertension Uncontrolled blood pressure.  Will restart amlodipine 5 mg a day.  Follow-up in 3 months.  April Saunders was seen today for establish care.  Diagnoses and all orders for this visit:  Uncontrolled hypertension  Dyslipidemia -     Lipid panel -     simvastatin (ZOCOR) 20 MG tablet; Take 1 tablet (20 mg total) by mouth daily.  Essential hypertension -     CBC with Differential/Platelet -     Comprehensive metabolic panel -     amLODipine (NORVASC) 5 MG tablet; Take 1 tablet (5 mg total) by mouth daily.    Patient Instructions       If you have lab work done today you will be contacted with your lab results within the next 2 weeks.  If you have not heard from [place] then please contact us. The fastest way to get your results is to register for My Chart.   IF you received an x-ray today, you will receive an invoice from [facility]. Please contact [facility] at [phone] with questions or concerns regarding your invoice.   IF you received labwork today, you will receive an invoice from [place]. Please contact [facility] at [phone] with questions or concerns regarding your invoice.   Our billing staff will not be able to assist you with questions regarding bills from these companies.  You will be contacted with the lab results as soon as they are available. The fastest way to get your results is to activate your My Chart account. Instructions are located on the last page of this paperwork. If you have not heard from [place] regarding the results in 2 weeks, please contact this office.      Hypertension Hypertension, commonly called high blood pressure, is when the force of blood pumping through the arteries is too strong. The arteries are the blood vessels that carry blood from the heart throughout the body. Hypertension forces the heart to work harder to pump blood and may cause arteries to become narrow or  stiff. Having untreated or uncontrolled hypertension can cause heart attacks, strokes, kidney disease, and other problems. A blood pressure reading consists of a higher number over a lower number. Ideally, your blood pressure should be below 120/80. The first ("top") number is called the systolic pressure. It is a measure of the pressure in your arteries as your heart beats. The second ("bottom") number is called the diastolic pressure. It is a measure of the pressure in your arteries as the heart relaxes. What are the causes? The cause of this condition is not known. What increases the risk? Some risk factors for high blood pressure are under your control. Others are not. Factors you can change  Smoking.  Having type 2 diabetes mellitus, high cholesterol, or both.  Not getting enough exercise or physical activity.  Being overweight.  Having too much fat, sugar, calories, or salt (sodium) in your diet.  Drinking too much alcohol. Factors that are difficult or impossible to change  Having chronic kidney disease.  Having a family history of high blood pressure.  Age. Risk increases with age.  Race. You may be at higher risk if you are African-American.  Gender. Men are at higher risk than women before age 95. After age 22, women are at higher risk than men.  Having obstructive sleep apnea.  Stress. What are the signs or symptoms? Extremely high blood pressure (hypertensive crisis) may cause:  Headache.  Anxiety.  Shortness of breath.  Nosebleed.  Nausea and vomiting.  Severe chest  pain.  Jerky movements you cannot control (seizures). How is this diagnosed? This condition is diagnosed by measuring your blood pressure while you are seated, with your arm resting on a surface. The cuff of the blood pressure monitor will be placed directly against the skin of your upper arm at the level of your heart. It should be measured at least twice using the same arm. Certain  conditions can cause a difference in blood pressure between your right and left arms. Certain factors can cause blood pressure readings to be lower or higher than normal (elevated) for a short period of time:  When your blood pressure is higher when you are in a health care provider's office than when you are at home, this is called white coat hypertension. Most people with this condition do not need medicines.  When your blood pressure is higher at home than when you are in a health care provider's office, this is called masked hypertension. Most people with this condition may need medicines to control blood pressure. If you have a high blood pressure reading during one visit or you have normal blood pressure with other risk factors:  You may be asked to return on a different day to have your blood pressure checked again.  You may be asked to monitor your blood pressure at home for 1 week or longer. If you are diagnosed with hypertension, you may have other blood or imaging tests to help your health care provider understand your overall risk for other conditions. How is this treated? This condition is treated by making healthy lifestyle changes, such as eating healthy foods, exercising more, and reducing your alcohol intake. Your health care provider may prescribe medicine if lifestyle changes are not enough to get your blood pressure under control, and if:  Your systolic blood pressure is above 130.  Your diastolic blood pressure is above 80. Your personal target blood pressure may vary depending on your medical conditions, your age, and other factors. Follow these instructions at home: Eating and drinking   Eat a diet that is high in fiber and potassium, and low in sodium, added sugar, and fat. An example eating plan is called the DASH (Dietary Approaches to Stop Hypertension) diet. To eat this way: ? Eat plenty of fresh fruits and vegetables. Try to fill half of your plate at each meal  with fruits and vegetables. ? Eat whole grains, such as whole wheat pasta, brown rice, or whole grain bread. Fill about one quarter of your plate with whole grains. ? Eat or drink low-fat dairy products, such as skim milk or low-fat yogurt. ? Avoid fatty cuts of meat, processed or cured meats, and poultry with skin. Fill about one quarter of your plate with lean proteins, such as fish, chicken without skin, beans, eggs, and tofu. ? Avoid premade and processed foods. These tend to be higher in sodium, added sugar, and fat.  Reduce your daily sodium intake. Most people with hypertension should eat less than 1,500 mg of sodium a day.  Limit alcohol intake to no more than 1 drink a day for nonpregnant women and 2 drinks a day for men. One drink equals 12 oz of beer, 5 oz of wine, or 1 oz of hard liquor. Lifestyle   Work with your health care provider to maintain a healthy body weight or to lose weight. Ask what an ideal weight is for you.  Get at least 30 minutes of exercise that causes your heart to beat faster (  aerobic exercise) most days of the week. Activities may include walking, swimming, or biking.  Include exercise to strengthen your muscles (resistance exercise), such as pilates or lifting weights, as part of your weekly exercise routine. Try to do these types of exercises for 30 minutes at least 3 days a week.  Do not use any products that contain nicotine or tobacco, such as cigarettes and e-cigarettes. If you need help quitting, ask your health care provider.  Monitor your blood pressure at home as told by your health care provider.  Keep all follow-up visits as told by your health care provider. This is important. Medicines  Take over-the-counter and prescription medicines only as told by your health care provider. Follow directions carefully. Blood pressure medicines must be taken as prescribed.  Do not skip doses of blood pressure medicine. Doing this puts you at risk for  problems and can make the medicine less effective.  Ask your health care provider about side effects or reactions to medicines that you should watch for. Contact a health care provider if:  You think you are having a reaction to a medicine you are taking.  You have headaches that keep coming back (recurring).  You feel dizzy.  You have swelling in your ankles.  You have trouble with your vision. Get help right away if:  You develop a severe headache or confusion.  You have unusual weakness or numbness.  You feel faint.  You have severe pain in your chest or abdomen.  You vomit repeatedly.  You have trouble breathing. Summary  Hypertension is when the force of blood pumping through your arteries is too strong. If this condition is not controlled, it may put you at risk for serious complications.  Your personal target blood pressure may vary depending on your medical conditions, your age, and other factors. For most people, a normal blood pressure is less than 120/80.  Hypertension is treated with lifestyle changes, medicines, or a combination of both. Lifestyle changes include weight loss, eating a healthy, low-sodium diet, exercising more, and limiting alcohol. This information is not intended to replace advice given to you by your health care provider. Make sure you discuss any questions you have with your health care provider. Document Released: 10/29/2005 Document Revised: 09/26/2016 Document Reviewed: 09/26/2016 Elsevier Interactive Patient Education  2019 Elsevier Inc.      Agustina Caroli, MD [facility]

## 2018-12-30 NOTE — Assessment & Plan Note (Addendum)
Uncontrolled blood pressure.  Will restart amlodipine 5 mg a day.  Follow-up in 3 months.

## 2018-12-30 NOTE — Patient Instructions (Addendum)
   If you have lab work done today you will be contacted with your lab results within the next 2 weeks.  If you have not heard from us then please contact us. The fastest way to get your results is to register for My Chart.   IF you received an x-ray today, you will receive an invoice from [facility]. Please contact [facility] at [phone] with questions or concerns regarding your invoice.   IF you received labwork today, you will receive an invoice from [facility]. Please contact [facility] at [phone] with questions or concerns regarding your invoice.   Our billing staff will not be able to assist you with questions regarding bills from these companies.  You will be contacted with the lab results as soon as they are available. The fastest way to get your results is to activate your My Chart account. Instructions are located on the last page of this paperwork. If you have not heard from us regarding the results in 2 weeks, please contact this office.       Hypertension Hypertension, commonly called high blood pressure, is when the force of blood pumping through the arteries is too strong. The arteries are the blood vessels that carry blood from the heart throughout the body. Hypertension forces the heart to work harder to pump blood and may cause arteries to become narrow or stiff. Having untreated or uncontrolled hypertension can cause heart attacks, strokes, kidney disease, and other problems. A blood pressure reading consists of a higher number over a lower number. Ideally, your blood pressure should be below 120/80. The first ("top") number is called the systolic pressure. It is a measure of the pressure in your arteries as your heart beats. The second ("bottom") number is called the diastolic pressure. It is a measure of the pressure in your arteries as the heart relaxes. What are the causes? The cause of this condition is not known. What increases the  risk? Some risk factors for high blood pressure are under your control. Others are not. Factors you can change  Smoking.  Having type 2 diabetes mellitus, high cholesterol, or both.  Not getting enough exercise or physical activity.  Being overweight.  Having too much fat, sugar, calories, or salt (sodium) in your diet.  Drinking too much alcohol. Factors that are difficult or impossible to change  Having chronic kidney disease.  Having a family history of high blood pressure.  Age. Risk increases with age.  Race. You may be at higher risk if you are African-American.  Gender. Men are at higher risk than women before age 45. After age 65, women are at higher risk than men.  Having obstructive sleep apnea.  Stress. What are the signs or symptoms? Extremely high blood pressure (hypertensive crisis) may cause:  Headache.  Anxiety.  Shortness of breath.  Nosebleed.  Nausea and vomiting.  Severe chest pain.  Jerky movements you cannot control (seizures). How is this diagnosed? This condition is diagnosed by measuring your blood pressure while you are seated, with your arm resting on a surface. The cuff of the blood pressure monitor will be placed directly against the skin of your upper arm at the level of your heart. It should be measured at least twice using the same arm. Certain conditions can cause a difference in blood pressure between your right and left arms. Certain factors can cause blood pressure readings to be lower or higher than normal (elevated) for a short period of time:    When your blood pressure is higher when you are in a health care provider's office than when you are at home, this is called white coat hypertension. Most people with this condition do not need medicines.  When your blood pressure is higher at home than when you are in a health care provider's office, this is called masked hypertension. Most people with this condition may need medicines  to control blood pressure. If you have a high blood pressure reading during one visit or you have normal blood pressure with other risk factors:  You may be asked to return on a different day to have your blood pressure checked again.  You may be asked to monitor your blood pressure at home for 1 week or longer. If you are diagnosed with hypertension, you may have other blood or imaging tests to help your health care provider understand your overall risk for other conditions. How is this treated? This condition is treated by making healthy lifestyle changes, such as eating healthy foods, exercising more, and reducing your alcohol intake. Your health care provider may prescribe medicine if lifestyle changes are not enough to get your blood pressure under control, and if:  Your systolic blood pressure is above 130.  Your diastolic blood pressure is above 80. Your personal target blood pressure may vary depending on your medical conditions, your age, and other factors. Follow these instructions at home: Eating and drinking   Eat a diet that is high in fiber and potassium, and low in sodium, added sugar, and fat. An example eating plan is called the DASH (Dietary Approaches to Stop Hypertension) diet. To eat this way: ? Eat plenty of fresh fruits and vegetables. Try to fill half of your plate at each meal with fruits and vegetables. ? Eat whole grains, such as whole wheat pasta, brown rice, or whole grain bread. Fill about one quarter of your plate with whole grains. ? Eat or drink low-fat dairy products, such as skim milk or low-fat yogurt. ? Avoid fatty cuts of meat, processed or cured meats, and poultry with skin. Fill about one quarter of your plate with lean proteins, such as fish, chicken without skin, beans, eggs, and tofu. ? Avoid premade and processed foods. These tend to be higher in sodium, added sugar, and fat.  Reduce your daily sodium intake. Most people with hypertension should  eat less than 1,500 mg of sodium a day.  Limit alcohol intake to no more than 1 drink a day for nonpregnant women and 2 drinks a day for men. One drink equals 12 oz of beer, 5 oz of wine, or 1 oz of hard liquor. Lifestyle   Work with your health care provider to maintain a healthy body weight or to lose weight. Ask what an ideal weight is for you.  Get at least 30 minutes of exercise that causes your heart to beat faster (aerobic exercise) most days of the week. Activities may include walking, swimming, or biking.  Include exercise to strengthen your muscles (resistance exercise), such as pilates or lifting weights, as part of your weekly exercise routine. Try to do these types of exercises for 30 minutes at least 3 days a week.  Do not use any products that contain nicotine or tobacco, such as cigarettes and e-cigarettes. If you need help quitting, ask your health care provider.  Monitor your blood pressure at home as told by your health care provider.  Keep all follow-up visits as told by your health care provider.   This is important. Medicines  Take over-the-counter and prescription medicines only as told by your health care provider. Follow directions carefully. Blood pressure medicines must be taken as prescribed.  Do not skip doses of blood pressure medicine. Doing this puts you at risk for problems and can make the medicine less effective.  Ask your health care provider about side effects or reactions to medicines that you should watch for. Contact a health care provider if:  You think you are having a reaction to a medicine you are taking.  You have headaches that keep coming back (recurring).  You feel dizzy.  You have swelling in your ankles.  You have trouble with your vision. Get help right away if:  You develop a severe headache or confusion.  You have unusual weakness or numbness.  You feel faint.  You have severe pain in your chest or abdomen.  You vomit  repeatedly.  You have trouble breathing. Summary  Hypertension is when the force of blood pumping through your arteries is too strong. If this condition is not controlled, it may put you at risk for serious complications.  Your personal target blood pressure may vary depending on your medical conditions, your age, and other factors. For most people, a normal blood pressure is less than 120/80.  Hypertension is treated with lifestyle changes, medicines, or a combination of both. Lifestyle changes include weight loss, eating a healthy, low-sodium diet, exercising more, and limiting alcohol. This information is not intended to replace advice given to you by your health care provider. Make sure you discuss any questions you have with your health care provider. Document Released: 10/29/2005 Document Revised: 09/26/2016 Document Reviewed: 09/26/2016 Elsevier Interactive Patient Education  2019 Elsevier Inc.  

## 2019-01-01 ENCOUNTER — Encounter: Payer: Self-pay | Admitting: Radiology

## 2019-04-01 ENCOUNTER — Ambulatory Visit (INDEPENDENT_AMBULATORY_CARE_PROVIDER_SITE_OTHER): Payer: 59 | Admitting: Emergency Medicine

## 2019-04-01 ENCOUNTER — Telehealth: Payer: Self-pay | Admitting: *Deleted

## 2019-04-01 ENCOUNTER — Other Ambulatory Visit: Payer: Self-pay

## 2019-04-01 ENCOUNTER — Encounter: Payer: Self-pay | Admitting: Emergency Medicine

## 2019-04-01 ENCOUNTER — Other Ambulatory Visit: Payer: Self-pay | Admitting: *Deleted

## 2019-04-01 ENCOUNTER — Telehealth (INDEPENDENT_AMBULATORY_CARE_PROVIDER_SITE_OTHER): Payer: 59 | Admitting: Emergency Medicine

## 2019-04-01 VITALS — Ht 64.0 in | Wt 185.0 lb

## 2019-04-01 DIAGNOSIS — Z23 Encounter for immunization: Secondary | ICD-10-CM

## 2019-04-01 DIAGNOSIS — I10 Essential (primary) hypertension: Secondary | ICD-10-CM

## 2019-04-01 DIAGNOSIS — E785 Hyperlipidemia, unspecified: Secondary | ICD-10-CM

## 2019-04-01 DIAGNOSIS — G8929 Other chronic pain: Secondary | ICD-10-CM

## 2019-04-01 NOTE — Progress Notes (Signed)
Patient is here for Tdap vaccine only. Patient tolerated vaccine well.
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
## 2019-04-08 ENCOUNTER — Other Ambulatory Visit: Payer: Self-pay

## 2019-04-08 ENCOUNTER — Ambulatory Visit (INDEPENDENT_AMBULATORY_CARE_PROVIDER_SITE_OTHER): Payer: 59 | Admitting: Family Medicine

## 2019-04-08 ENCOUNTER — Encounter: Payer: Self-pay | Admitting: Family Medicine

## 2019-04-08 ENCOUNTER — Ambulatory Visit (INDEPENDENT_AMBULATORY_CARE_PROVIDER_SITE_OTHER): Payer: 59

## 2019-04-08 DIAGNOSIS — M545 Low back pain: Secondary | ICD-10-CM

## 2019-04-08 DIAGNOSIS — G8929 Other chronic pain: Secondary | ICD-10-CM

## 2019-04-08 MED ORDER — NABUMETONE 750 MG PO TABS: 750.0000 mg | ORAL_TABLET | Freq: Two times a day (BID) | ORAL | 6 refills | Status: DC | PRN

## 2019-04-08 MED ORDER — TIZANIDINE HCL 2 MG PO TABS: 2.0000 mg | ORAL_TABLET | Freq: Four times a day (QID) | ORAL | 1 refills | Status: DC | PRN

## 2019-04-08 MED ORDER — VITAMIN D-3 125 MCG (5000 UT) PO TABS: 1.0000 | ORAL_TABLET | Freq: Every day | ORAL | 3 refills | Status: DC
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
## 2019-04-08 NOTE — Telephone Encounter (Signed)
Entered in error

## 2019-04-11 ENCOUNTER — Telehealth: Payer: Self-pay

## 2019-04-11 NOTE — Telephone Encounter (Signed)
Attempted to contact patient, x2 numbers not in working condition, x1 public work [employer] not left. UTA for covid s/sx following potential covid exposure.

## 2019-07-06 ENCOUNTER — Encounter: Payer: Self-pay | Admitting: Emergency Medicine

## 2019-07-06 ENCOUNTER — Other Ambulatory Visit: Payer: Self-pay

## 2019-07-06 ENCOUNTER — Ambulatory Visit (INDEPENDENT_AMBULATORY_CARE_PROVIDER_SITE_OTHER): Payer: 59 | Admitting: Emergency Medicine

## 2019-07-06 VITALS — BP 135/75 | HR 70 | Temp 98.5°F | Resp 16 | Wt 186.2 lb

## 2019-07-06 DIAGNOSIS — E785 Hyperlipidemia, unspecified: Secondary | ICD-10-CM

## 2019-07-06 DIAGNOSIS — Z23 Encounter for immunization: Secondary | ICD-10-CM

## 2019-07-06 DIAGNOSIS — R7303 Prediabetes: Secondary | ICD-10-CM | POA: Diagnosis not present

## 2019-07-06 DIAGNOSIS — I10 Essential (primary) hypertension: Secondary | ICD-10-CM | POA: Diagnosis not present

## 2019-07-06 NOTE — Assessment & Plan Note (Signed)
Well-controlled.  Continue present medications.  Blood work done today.  Follow-up in 6 months.

## 2019-07-06 NOTE — Addendum Note (Signed)
Addended by: Alfredia Ferguson A on: 07/06/2019 04:39 PM   Modules accepted: Orders

## 2019-07-06 NOTE — Progress Notes (Signed)
Lab Results  Component Value Date   HGBA1C 6.4 (H) 09/05/2015   BP Readings from Last 3 Encounters:  12/30/18 (!) 176/82  09/05/15 118/80  11/25/14 140/78   Lab Results  Component Value Date   CHOL 222 (H) 12/30/2018   HDL 57 12/30/2018   LDLCALC 149 (H) 12/30/2018   TRIG 78 12/30/2018   CHOLHDL 3.9 12/30/2018   Lab Results  Component Value Date   CREATININE 0.76 12/30/2018   BUN 12 12/30/2018   NA 140 12/30/2018   K 4.3 12/30/2018   CL 104 12/30/2018   CO2 23 12/30/2018   Wt Readings from Last 3 Encounters:  04/01/19 185 lb (83.9 kg)  12/30/18 185 lb (83.9 kg)  09/05/15 180 lb 6.4 oz (81.8 kg)   April Saunders 63 y.o.   Chief Complaint  Patient presents with   Hypertension    follow up 3 months    HISTORY OF PRESENT ILLNESS: This is a 63 y.o. female with history of hypertension here for follow-up.  Has the following medical problems: 1.  Hypertension: On amlodipine 5 mg a day. 2.  Dyslipidemia: On simvastatin 20 mg a day. 3.  Prediabetes: No medications. Has no complaints or medical concerns today.  HPI   Prior to Admission medications   Medication Sig Start Date End Date Taking? Authorizing Provider  Acetaminophen (TYLENOL PO) Take by mouth as needed.   Yes [provider]  Cholecalciferol (VITAMIN D-3) 125 MCG (5000 UT) TABS Take 1 tablet by mouth daily. 04/08/19  Yes Hilts, Legrand Como, MD  [facility] Liver Oil CAPS Take 1 capsule by mouth daily.   Yes [provider]  fish oil-omega-3 fatty acids 1000 MG capsule Take 1 g by mouth daily.   Yes [provider]  nabumetone (RELAFEN) 750 MG tablet Take 1 tablet (750 mg total) by mouth 2 (two) times daily as needed. 04/08/19  Yes Hilts, Legrand Como, MD  tiZANidine (ZANAFLEX) 2 MG tablet Take 1-2 tablets (2-4 mg total) by mouth every 6 (six) hours as needed for muscle spasms. 04/08/19  Yes Hilts, Legrand Como, MD  amLODipine (NORVASC) 5 MG tablet Take 1 tablet (5 mg total) by mouth daily. 12/30/18 03/30/19   Horald Pollen, MD  simvastatin (ZOCOR) 20 MG tablet Take 1 tablet (20 mg total) by mouth daily. 12/30/18 03/30/19  Horald Pollen, MD    Allergies  Allergen Reactions   Atorvastatin Calcium Itching   Lipitor [Atorvastatin]     Weakness    Lisinopril Other (See Comments)    Shoulder pain    Patient Active Problem List   Diagnosis Date Noted   Dyslipidemia 12/30/2018   Arthritis, low back 12/18/2011   Hypertension    Obesity     Past Medical History:  Diagnosis Date   Abnormal MRI    brain   DJD (degenerative joint disease)    multiple joints   Hyperlipidemia    Hypertension    Lipoma    rt ankle   Obesity    Trigeminal neuralgia     Past Surgical History:  Procedure Laterality Date   ABDOMINAL HYSTERECTOMY     ? 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  Social Needs  Financial resource strain: Not on file   Food insecurity    Worry: Not on file    Inability: Not on file   Transportation needs    Medical: Not on file    Non-medical: Not on file  Tobacco Use   Smoking status: Former Smoker   Smokeless tobacco: Never Used   Tobacco comment: 2008  Substance and Sexual Activity   Alcohol use: No    Alcohol/week: 0.0 standard drinks   Drug use: No   Sexual activity: Yes    Partners: Male    Birth control/protection: Surgical  Lifestyle   Physical activity    Days per week: Not on file    Minutes per session: Not on file   Stress: Not on file  Relationships   Social connections    Talks on phone: Not on file    Gets together: Not on file    Attends religious service: Not on file    Active member of club or organization: Not on file    Attends meetings of clubs or  organizations: Not on file    Relationship status: Not on file   Intimate partner violence    Fear of current or ex partner: Not on file    Emotionally abused: Not on file    Physically abused: Not on file    Forced sexual activity: Not on file  Other Topics Concern   Not on file  Social History Narrative   Lives with her husband. Three adult child live independently and nearby.    Family History  Problem Relation Age of Onset   Diabetes Mellitus I Mother    Hypertension Brother    Diabetes Mellitus I Brother      Review of Systems  Constitutional: Negative.  Negative for chills and fever.  HENT: Negative.  Negative for congestion and sore throat.   Eyes: Negative.   Respiratory: Negative.  Negative for cough and shortness of breath.   Cardiovascular: Negative.  Negative for chest pain and palpitations.  Gastrointestinal: Negative.  Negative for abdominal pain, diarrhea, nausea and vomiting.  Genitourinary: Negative.   Musculoskeletal: Negative.  Negative for myalgias and neck pain.  Skin: Negative.  Negative for rash.  Neurological: Negative.  Negative for dizziness and headaches.  Endo/Heme/Allergies: Negative.   All other systems reviewed and are negative.   Vitals:   07/06/19 1607  BP: 135/75  Pulse: 70  Resp: 16  Temp: 98.5 F (36.9 C)  SpO2: 99%    Physical Exam Vitals signs reviewed.  Constitutional:      Appearance: Normal appearance.  HENT:     Head: Normocephalic.  Eyes:     Extraocular Movements: Extraocular movements intact.     Conjunctiva/sclera: Conjunctivae normal.     Pupils: Pupils are equal, round, and reactive to light.  Neck:     Musculoskeletal: Normal range of motion and neck supple.  Cardiovascular:     Rate and Rhythm: Normal rate.     Pulses: Normal pulses.     Heart sounds: Normal heart sounds.  Pulmonary:     Effort: Pulmonary effort is normal.     Breath sounds: Normal breath sounds.  Musculoskeletal: Normal range of  motion.     Right lower leg: No edema.     Left lower leg: No edema.  Skin:    General: Skin is warm and dry.     Capillary Refill: Capillary refill takes less than 2 seconds.  Neurological:     General: No  focal deficit present.     Mental Status: She is alert and oriented to person, place, and time.  Psychiatric:        Mood and Affect: Mood normal.        Behavior: Behavior normal.      ASSESSMENT & PLAN: Hypertension Well-controlled.  Continue present medications.  Blood work done today.  Follow-up in 6 months.   Patient Instructions       If you have lab work done today you will be contacted with your lab results within the next 2 weeks.  If you have not heard from [place] then please contact us. The fastest way to get your results is to register for My Chart.   IF you received an x-ray today, you will receive an invoice from [facility]. Please contact [facility] at [phone] with questions or concerns regarding your invoice.   IF you received labwork today, you will receive an invoice from [facility]. Please contact [facility] at [phone] with questions or concerns regarding your invoice.   Our billing staff will not be able to assist you with questions regarding bills from these companies.  You will be contacted with the lab results as soon as they are available. The fastest way to get your results is to activate your My Chart account. Instructions are located on the last page of this paperwork. If you have not heard from [place] regarding the results in 2 weeks, please contact this office.     Hypertension, Adult High blood pressure (hypertension) is when the force of blood pumping through the arteries is too strong. The arteries are the blood vessels that carry blood from the heart throughout the body. Hypertension forces the heart to work harder to pump blood and may cause arteries to become narrow or stiff. Untreated or uncontrolled hypertension can  cause a heart attack, heart failure, a stroke, kidney disease, and other problems. A blood pressure reading consists of a higher number over a lower number. Ideally, your blood pressure should be below 120/80. The first ("top") number is called the systolic pressure. It is a measure of the pressure in your arteries as your heart beats. The second ("bottom") number is called the diastolic pressure. It is a measure of the pressure in your arteries as the heart relaxes. What are the causes? The exact cause of this condition is not known. There are some conditions that result in or are related to high blood pressure. What increases the risk? Some risk factors for high blood pressure are under your control. The following factors may make you more likely to develop this condition:  Smoking.  Having type 2 diabetes mellitus, high cholesterol, or both.  Not getting enough exercise or physical activity.  Being overweight.  Having too much fat, sugar, calories, or salt (sodium) in your diet.  Drinking too much alcohol. Some risk factors for high blood pressure may be difficult or impossible to change. Some of these factors include:  Having chronic kidney disease.  Having a family history of high blood pressure.  Age. Risk increases with age.  Race. You may be at higher risk if you are African American.  Gender. Men are at higher risk than women before age 88. After age 24, women are at higher risk than men.  Having obstructive sleep apnea.  Stress. What are the signs or symptoms? High blood pressure may not cause symptoms. Very high blood pressure (hypertensive crisis) may cause:  Headache.  Anxiety.  Shortness of breath.  Nosebleed.  Nausea and vomiting.  Vision changes.  Severe chest pain.  Seizures. How is this diagnosed? This condition is diagnosed by measuring your blood pressure while you are seated, with your arm resting on a flat surface, your legs uncrossed, and  your feet flat on the floor. The cuff of the blood pressure monitor will be placed directly against the skin of your upper arm at the level of your heart. It should be measured at least twice using the same arm. Certain conditions can cause a difference in blood pressure between your right and left arms. Certain factors can cause blood pressure readings to be lower or higher than normal for a short period of time:  When your blood pressure is higher when you are in a health care provider's office than when you are at home, this is called white coat hypertension. Most people with this condition do not need medicines.  When your blood pressure is higher at home than when you are in a health care provider's office, this is called masked hypertension. Most people with this condition may need medicines to control blood pressure. If you have a high blood pressure reading during one visit or you have normal blood pressure with other risk factors, you may be asked to:  Return on a different day to have your blood pressure checked again.  Monitor your blood pressure at home for 1 week or longer. If you are diagnosed with hypertension, you may have other blood or imaging tests to help your health care provider understand your overall risk for other conditions. How is this treated? This condition is treated by making healthy lifestyle changes, such as eating healthy foods, exercising more, and reducing your alcohol intake. Your health care provider may prescribe medicine if lifestyle changes are not enough to get your blood pressure under control, and if:  Your systolic blood pressure is above 130.  Your diastolic blood pressure is above 80. Your personal target blood pressure may vary depending on your medical conditions, your age, and other factors. Follow these instructions at home: Eating and drinking   Eat a diet that is high in fiber and potassium, and low in sodium, added sugar, and fat. An  example eating plan is called the DASH (Dietary Approaches to Stop Hypertension) diet. To eat this way: ? Eat plenty of fresh fruits and vegetables. Try to fill one half of your plate at each meal with fruits and vegetables. ? Eat whole grains, such as whole-wheat pasta, brown rice, or whole-grain bread. Fill about one fourth of your plate with whole grains. ? Eat or drink low-fat dairy products, such as skim milk or low-fat yogurt. ? Avoid fatty cuts of meat, processed or cured meats, and poultry with skin. Fill about one fourth of your plate with lean proteins, such as fish, chicken without skin, beans, eggs, or tofu. ? Avoid pre-made and processed foods. These tend to be higher in sodium, added sugar, and fat.  Reduce your daily sodium intake. Most people with hypertension should eat less than 1,500 mg of sodium a day.  Do not drink alcohol if: ? Your health care provider tells you not to drink. ? You are pregnant, may be pregnant, or are planning to become pregnant.  If you drink alcohol: ? Limit how much you use to:  0-1 drink a day for women.  0-2 drinks a day for men. ? Be aware of how much alcohol is in your drink. In the [place] one drink  equals one 12 oz bottle of beer (355 mL), one 5 oz glass of wine (148 mL), or one 1 oz glass of hard liquor (44 mL). Lifestyle   Work with your health care provider to maintain a healthy body weight or to lose weight. Ask what an ideal weight is for you.  Get at least 30 minutes of exercise most days of the week. Activities may include walking, swimming, or biking.  Include exercise to strengthen your muscles (resistance exercise), such as Pilates or lifting weights, as part of your weekly exercise routine. Try to do these types of exercises for 30 minutes at least 3 days a week.  Do not use any products that contain nicotine or tobacco, such as cigarettes, e-cigarettes, and chewing tobacco. If you need help quitting, ask your health care  provider.  Monitor your blood pressure at home as told by your health care provider.  Keep all follow-up visits as told by your health care provider. This is important. Medicines  Take over-the-counter and prescription medicines only as told by your health care provider. Follow directions carefully. Blood pressure medicines must be taken as prescribed.  Do not skip doses of blood pressure medicine. Doing this puts you at risk for problems and can make the medicine less effective.  Ask your health care provider about side effects or reactions to medicines that you should watch for. Contact a health care provider if you:  Think you are having a reaction to a medicine you are taking.  Have headaches that keep coming back (recurring).  Feel dizzy.  Have swelling in your ankles.  Have trouble with your vision. Get help right away if you:  Develop a severe headache or confusion.  Have unusual weakness or numbness.  Feel faint.  Have severe pain in your chest or abdomen.  Vomit repeatedly.  Have trouble breathing. Summary  Hypertension is when the force of blood pumping through your arteries is too strong. If this condition is not controlled, it may put you at risk for serious complications.  Your personal target blood pressure may vary depending on your medical conditions, your age, and other factors. For most people, a normal blood pressure is less than 120/80.  Hypertension is treated with lifestyle changes, medicines, or a combination of both. Lifestyle changes include losing weight, eating a healthy, low-sodium diet, exercising more, and limiting alcohol. This information is not intended to replace advice given to you by your health care provider. Make sure you discuss any questions you have with your health care provider. Document Released: 10/29/2005 Document Revised: 07/09/2018 Document Reviewed: 07/09/2018 Elsevier Patient Education  2020 [place] DASH stands for "Dietary Approaches to Stop Hypertension." The DASH eating plan is a healthy eating plan that has been shown to reduce high blood pressure (hypertension). It may also reduce your risk for type 2 diabetes, heart disease, and stroke. The DASH eating plan may also help with weight loss. What are tips for following this plan?  General guidelines  Avoid eating more than 2,300 mg (milligrams) of salt (sodium) a day. If you have hypertension, you may need to reduce your sodium intake to 1,500 mg a day.  Limit alcohol intake to no more than 1 drink a day for nonpregnant women and 2 drinks a day for men. One drink equals 12 oz of beer, 5 oz of wine, or 1 oz of hard liquor.  Work with your health care provider to maintain a healthy body weight  or to lose weight. Ask what an ideal weight is for you.  Get at least 30 minutes of exercise that causes your heart to beat faster (aerobic exercise) most days of the week. Activities may include walking, swimming, or biking.  Work with your health care provider or diet and nutrition specialist (dietitian) to adjust your eating plan to your individual calorie needs. Reading food labels   Check food labels for the amount of sodium per serving. Choose foods with less than 5 percent of the Daily Value of sodium. Generally, foods with less than 300 mg of sodium per serving fit into this eating plan.  To find whole grains, look for the word "whole" as the first word in the ingredient list. Shopping  Buy products labeled as "low-sodium" or "no salt added."  Buy fresh foods. Avoid canned foods and premade or frozen meals. Cooking  Avoid adding salt when cooking. Use salt-free seasonings or herbs instead of table salt or sea salt. Check with your health care provider or pharmacist before using salt substitutes.  Do not fry foods. Cook foods using healthy methods such as baking, boiling, grilling, and broiling instead.  Cook with  heart-healthy oils, such as olive, canola, soybean, or sunflower oil. Meal planning  Eat a balanced diet that includes: ? 5 or more servings of fruits and vegetables each day. At each meal, try to fill half of your plate with fruits and vegetables. ? Up to 6-8 servings of whole grains each day. ? Less than 6 oz of lean meat, poultry, or fish each day. A 3-oz serving of meat is about the same size as a deck of cards. One egg equals 1 oz. ? 2 servings of low-fat dairy each day. ? A serving of nuts, seeds, or beans 5 times each week. ? Heart-healthy fats. Healthy fats called Omega-3 fatty acids are found in foods such as flaxseeds and coldwater fish, like sardines, salmon, and mackerel.  Limit how much you eat of the following: ? Canned or prepackaged foods. ? Food that is high in trans fat, such as fried foods. ? Food that is high in saturated fat, such as fatty meat. ? Sweets, desserts, sugary drinks, and other foods with added sugar. ? Full-fat dairy products.  Do not salt foods before eating.  Try to eat at least 2 vegetarian meals each week.  Eat more home-cooked food and less restaurant, buffet, and fast food.  When eating at a restaurant, ask that your food be prepared with less salt or no salt, if possible. What foods are recommended? The items listed may not be a complete list. Talk with your dietitian about what dietary choices are best for you. Grains Whole-grain or whole-wheat bread. Whole-grain or whole-wheat pasta. Brown rice. [place]. Bulgur. Whole-grain and low-sodium cereals. Pita bread. Low-fat, low-sodium crackers. Whole-wheat flour tortillas. Vegetables Fresh or frozen vegetables (raw, steamed, roasted, or grilled). Low-sodium or reduced-sodium tomato and vegetable juice. Low-sodium or reduced-sodium tomato sauce and tomato paste. Low-sodium or reduced-sodium canned vegetables. Fruits All fresh, dried, or frozen fruit. Canned fruit in natural juice (without  added sugar). Meat and other protein foods Skinless chicken or [place]. Ground chicken or [place]. Pork with fat trimmed off. Fish and seafood. Egg whites. Dried beans, peas, or lentils. Unsalted nuts, nut butters, and seeds. Unsalted canned beans. Lean cuts of beef with fat trimmed off. Low-sodium, lean deli meat. Dairy Low-fat (1%) or fat-free (skim) milk. Fat-free, low-fat, or reduced-fat cheeses. Nonfat, low-sodium ricotta or cottage cheese.  Low-fat or nonfat yogurt. Low-fat, low-sodium cheese. Fats and oils Soft margarine without trans fats. Vegetable oil. Low-fat, reduced-fat, or light mayonnaise and salad dressings (reduced-sodium). Canola, safflower, olive, soybean, and sunflower oils. Avocado. Seasoning and other foods Herbs. Spices. Seasoning mixes without salt. Unsalted popcorn and pretzels. Fat-free sweets. What foods are not recommended? The items listed may not be a complete list. Talk with your dietitian about what dietary choices are best for you. Grains Baked goods made with fat, such as croissants, muffins, or some breads. Dry pasta or rice meal packs. Vegetables Creamed or fried vegetables. Vegetables in a cheese sauce. Regular canned vegetables (not low-sodium or reduced-sodium). Regular canned tomato sauce and paste (not low-sodium or reduced-sodium). Regular tomato and vegetable juice (not low-sodium or reduced-sodium). Angie Fava. Olives. Fruits Canned fruit in a light or heavy syrup. Fried fruit. Fruit in cream or butter sauce. Meat and other protein foods Fatty cuts of meat. Ribs. Fried meat. Berniece Salines. Sausage. Bologna and other processed lunch meats. Salami. Fatback. Hotdogs. Bratwurst. Salted nuts and seeds. Canned beans with added salt. Canned or smoked fish. Whole eggs or egg yolks. Chicken or [place] with skin. Dairy Whole or 2% milk, cream, and half-and-half. Whole or full-fat cream cheese. Whole-fat or sweetened yogurt. Full-fat cheese. Nondairy creamers. Whipped toppings.  Processed cheese and cheese spreads. Fats and oils Butter. Stick margarine. Lard. Shortening. Ghee. Bacon fat. Tropical oils, such as coconut, palm kernel, or palm oil. Seasoning and other foods Salted popcorn and pretzels. Onion salt, garlic salt, seasoned salt, table salt, and sea salt. Worcestershire sauce. Tartar sauce. Barbecue sauce. Teriyaki sauce. Soy sauce, including reduced-sodium. Steak sauce. Canned and packaged gravies. Fish sauce. Oyster sauce. Cocktail sauce. Horseradish that you find on the shelf. Ketchup. Mustard. Meat flavorings and tenderizers. Bouillon cubes. Hot sauce and Tabasco sauce. Premade or packaged marinades. Premade or packaged taco seasonings. Relishes. Regular salad dressings. Where to find more information:  National Heart, Lung, and [place]: https://wilson-eaton.com/  American Heart Association: www.heart.org Summary  The DASH eating plan is a healthy eating plan that has been shown to reduce high blood pressure (hypertension). It may also reduce your risk for type 2 diabetes, heart disease, and stroke.  With the DASH eating plan, you should limit salt (sodium) intake to 2,300 mg a day. If you have hypertension, you may need to reduce your sodium intake to 1,500 mg a day.  When on the DASH eating plan, aim to eat more fresh fruits and vegetables, whole grains, lean proteins, low-fat dairy, and heart-healthy fats.  Work with your health care provider or diet and nutrition specialist (dietitian) to adjust your eating plan to your individual calorie needs. This information is not intended to replace advice given to you by your health care provider. Make sure you discuss any questions you have with your health care provider. Document Released: 10/18/2011 Document Revised: 10/11/2017 Document Reviewed: 10/22/2016 Elsevier Patient Education  2020 Elsevier Inc.      Agustina Caroli, MD [facility]

## 2019-07-06 NOTE — Patient Instructions (Addendum)
   If you have lab work done today you will be contacted with your lab results within the next 2 weeks.  If you have not heard from us then please contact us. The fastest way to get your results is to register for My Chart.   IF you received an x-ray today, you will receive an invoice from [facility]. Please contact [facility] at [phone] with questions or concerns regarding your invoice.   IF you received labwork today, you will receive an invoice from [facility]. Please contact [facility] at [phone] with questions or concerns regarding your invoice.   Our billing staff will not be able to assist you with questions regarding bills from these companies.  You will be contacted with the lab results as soon as they are available. The fastest way to get your results is to activate your My Chart account. Instructions are located on the last page of this paperwork. If you have not heard from us regarding the results in 2 weeks, please contact this office.     Hypertension, Adult High blood pressure (hypertension) is when the force of blood pumping through the arteries is too strong. The arteries are the blood vessels that carry blood from the heart throughout the body. Hypertension forces the heart to work harder to pump blood and may cause arteries to become narrow or stiff. Untreated or uncontrolled hypertension can cause a heart attack, heart failure, a stroke, kidney disease, and other problems. A blood pressure reading consists of a higher number over a lower number. Ideally, your blood pressure should be below 120/80. The first ("top") number is called the systolic pressure. It is a measure of the pressure in your arteries as your heart beats. The second ("bottom") number is called the diastolic pressure. It is a measure of the pressure in your arteries as the heart relaxes. What are the causes? The exact cause of this condition is not known. There are some conditions  that result in or are related to high blood pressure. What increases the risk? Some risk factors for high blood pressure are under your control. The following factors may make you more likely to develop this condition:  Smoking.  Having type 2 diabetes mellitus, high cholesterol, or both.  Not getting enough exercise or physical activity.  Being overweight.  Having too much fat, sugar, calories, or salt (sodium) in your diet.  Drinking too much alcohol. Some risk factors for high blood pressure may be difficult or impossible to change. Some of these factors include:  Having chronic kidney disease.  Having a family history of high blood pressure.  Age. Risk increases with age.  Race. You may be at higher risk if you are African American.  Gender. Men are at higher risk than women before age 45. After age 65, women are at higher risk than men.  Having obstructive sleep apnea.  Stress. What are the signs or symptoms? High blood pressure may not cause symptoms. Very high blood pressure (hypertensive crisis) may cause:  Headache.  Anxiety.  Shortness of breath.  Nosebleed.  Nausea and vomiting.  Vision changes.  Severe chest pain.  Seizures. How is this diagnosed? This condition is diagnosed by measuring your blood pressure while you are seated, with your arm resting on a flat surface, your legs uncrossed, and your feet flat on the floor. The cuff of the blood pressure monitor will be placed directly against the skin of your upper arm at the level of your heart.   It should be measured at least twice using the same arm. Certain conditions can cause a difference in blood pressure between your right and left arms. Certain factors can cause blood pressure readings to be lower or higher than normal for a short period of time:  When your blood pressure is higher when you are in a health care provider's office than when you are at home, this is called white coat hypertension.  Most people with this condition do not need medicines.  When your blood pressure is higher at home than when you are in a health care provider's office, this is called masked hypertension. Most people with this condition may need medicines to control blood pressure. If you have a high blood pressure reading during one visit or you have normal blood pressure with other risk factors, you may be asked to:  Return on a different day to have your blood pressure checked again.  Monitor your blood pressure at home for 1 week or longer. If you are diagnosed with hypertension, you may have other blood or imaging tests to help your health care provider understand your overall risk for other conditions. How is this treated? This condition is treated by making healthy lifestyle changes, such as eating healthy foods, exercising more, and reducing your alcohol intake. Your health care provider may prescribe medicine if lifestyle changes are not enough to get your blood pressure under control, and if:  Your systolic blood pressure is above 130.  Your diastolic blood pressure is above 80. Your personal target blood pressure may vary depending on your medical conditions, your age, and other factors. Follow these instructions at home: Eating and drinking   Eat a diet that is high in fiber and potassium, and low in sodium, added sugar, and fat. An example eating plan is called the DASH (Dietary Approaches to Stop Hypertension) diet. To eat this way: ? Eat plenty of fresh fruits and vegetables. Try to fill one half of your plate at each meal with fruits and vegetables. ? Eat whole grains, such as whole-wheat pasta, brown rice, or whole-grain bread. Fill about one fourth of your plate with whole grains. ? Eat or drink low-fat dairy products, such as skim milk or low-fat yogurt. ? Avoid fatty cuts of meat, processed or cured meats, and poultry with skin. Fill about one fourth of your plate with lean proteins, such  as fish, chicken without skin, beans, eggs, or tofu. ? Avoid pre-made and processed foods. These tend to be higher in sodium, added sugar, and fat.  Reduce your daily sodium intake. Most people with hypertension should eat less than 1,500 mg of sodium a day.  Do not drink alcohol if: ? Your health care provider tells you not to drink. ? You are pregnant, may be pregnant, or are planning to become pregnant.  If you drink alcohol: ? Limit how much you use to:  0-1 drink a day for women.  0-2 drinks a day for men. ? Be aware of how much alcohol is in your drink. In the [place] one drink equals one 12 oz bottle of beer (355 mL), one 5 oz glass of wine (148 mL), or one 1 oz glass of hard liquor (44 mL). Lifestyle   Work with your health care provider to maintain a healthy body weight or to lose weight. Ask what an ideal weight is for you.  Get at least 30 minutes of exercise most days of the week. Activities may include walking, swimming, or   biking.  Include exercise to strengthen your muscles (resistance exercise), such as Pilates or lifting weights, as part of your weekly exercise routine. Try to do these types of exercises for 30 minutes at least 3 days a week.  Do not use any products that contain nicotine or tobacco, such as cigarettes, e-cigarettes, and chewing tobacco. If you need help quitting, ask your health care provider.  Monitor your blood pressure at home as told by your health care provider.  Keep all follow-up visits as told by your health care provider. This is important. Medicines  Take over-the-counter and prescription medicines only as told by your health care provider. Follow directions carefully. Blood pressure medicines must be taken as prescribed.  Do not skip doses of blood pressure medicine. Doing this puts you at risk for problems and can make the medicine less effective.  Ask your health care provider about side effects or reactions to medicines that you  should watch for. Contact a health care provider if you:  Think you are having a reaction to a medicine you are taking.  Have headaches that keep coming back (recurring).  Feel dizzy.  Have swelling in your ankles.  Have trouble with your vision. Get help right away if you:  Develop a severe headache or confusion.  Have unusual weakness or numbness.  Feel faint.  Have severe pain in your chest or abdomen.  Vomit repeatedly.  Have trouble breathing. Summary  Hypertension is when the force of blood pumping through your arteries is too strong. If this condition is not controlled, it may put you at risk for serious complications.  Your personal target blood pressure may vary depending on your medical conditions, your age, and other factors. For most people, a normal blood pressure is less than 120/80.  Hypertension is treated with lifestyle changes, medicines, or a combination of both. Lifestyle changes include losing weight, eating a healthy, low-sodium diet, exercising more, and limiting alcohol. This information is not intended to replace advice given to you by your health care provider. Make sure you discuss any questions you have with your health care provider. Document Released: 10/29/2005 Document Revised: 07/09/2018 Document Reviewed: 07/09/2018 Elsevier Patient Education  2020 [place] DASH stands for "Dietary Approaches to Stop Hypertension." The DASH eating plan is a healthy eating plan that has been shown to reduce high blood pressure (hypertension). It may also reduce your risk for type 2 diabetes, heart disease, and stroke. The DASH eating plan may also help with weight loss. What are tips for following this plan?  General guidelines  Avoid eating more than 2,300 mg (milligrams) of salt (sodium) a day. If you have hypertension, you may need to reduce your sodium intake to 1,500 mg a day.  Limit alcohol intake to no more than 1 drink a day  for nonpregnant women and 2 drinks a day for men. One drink equals 12 oz of beer, 5 oz of wine, or 1 oz of hard liquor.  Work with your health care provider to maintain a healthy body weight or to lose weight. Ask what an ideal weight is for you.  Get at least 30 minutes of exercise that causes your heart to beat faster (aerobic exercise) most days of the week. Activities may include walking, swimming, or biking.  Work with your health care provider or diet and nutrition specialist (dietitian) to adjust your eating plan to your individual calorie needs. Reading food labels   Check food labels for  the amount of sodium per serving. Choose foods with less than 5 percent of the Daily Value of sodium. Generally, foods with less than 300 mg of sodium per serving fit into this eating plan.  To find whole grains, look for the word "whole" as the first word in the ingredient list. Shopping  Buy products labeled as "low-sodium" or "no salt added."  Buy fresh foods. Avoid canned foods and premade or frozen meals. Cooking  Avoid adding salt when cooking. Use salt-free seasonings or herbs instead of table salt or sea salt. Check with your health care provider or pharmacist before using salt substitutes.  Do not fry foods. Cook foods using healthy methods such as baking, boiling, grilling, and broiling instead.  Cook with heart-healthy oils, such as olive, canola, soybean, or sunflower oil. Meal planning  Eat a balanced diet that includes: ? 5 or more servings of fruits and vegetables each day. At each meal, try to fill half of your plate with fruits and vegetables. ? Up to 6-8 servings of whole grains each day. ? Less than 6 oz of lean meat, poultry, or fish each day. A 3-oz serving of meat is about the same size as a deck of cards. One egg equals 1 oz. ? 2 servings of low-fat dairy each day. ? A serving of nuts, seeds, or beans 5 times each week. ? Heart-healthy fats. Healthy fats called  Omega-3 fatty acids are found in foods such as flaxseeds and coldwater fish, like sardines, salmon, and mackerel.  Limit how much you eat of the following: ? Canned or prepackaged foods. ? Food that is high in trans fat, such as fried foods. ? Food that is high in saturated fat, such as fatty meat. ? Sweets, desserts, sugary drinks, and other foods with added sugar. ? Full-fat dairy products.  Do not salt foods before eating.  Try to eat at least 2 vegetarian meals each week.  Eat more home-cooked food and less restaurant, buffet, and fast food.  When eating at a restaurant, ask that your food be prepared with less salt or no salt, if possible. What foods are recommended? The items listed may not be a complete list. Talk with your dietitian about what dietary choices are best for you. Grains Whole-grain or whole-wheat bread. Whole-grain or whole-wheat pasta. Brown rice. [place]. Bulgur. Whole-grain and low-sodium cereals. Pita bread. Low-fat, low-sodium crackers. Whole-wheat flour tortillas. Vegetables Fresh or frozen vegetables (raw, steamed, roasted, or grilled). Low-sodium or reduced-sodium tomato and vegetable juice. Low-sodium or reduced-sodium tomato sauce and tomato paste. Low-sodium or reduced-sodium canned vegetables. Fruits All fresh, dried, or frozen fruit. Canned fruit in natural juice (without added sugar). Meat and other protein foods Skinless chicken or [place]. Ground chicken or [place]. Pork with fat trimmed off. Fish and seafood. Egg whites. Dried beans, peas, or lentils. Unsalted nuts, nut butters, and seeds. Unsalted canned beans. Lean cuts of beef with fat trimmed off. Low-sodium, lean deli meat. Dairy Low-fat (1%) or fat-free (skim) milk. Fat-free, low-fat, or reduced-fat cheeses. Nonfat, low-sodium ricotta or cottage cheese. Low-fat or nonfat yogurt. Low-fat, low-sodium cheese. Fats and oils Soft margarine without trans fats. Vegetable oil. Low-fat,  reduced-fat, or light mayonnaise and salad dressings (reduced-sodium). Canola, safflower, olive, soybean, and sunflower oils. Avocado. Seasoning and other foods Herbs. Spices. Seasoning mixes without salt. Unsalted popcorn and pretzels. Fat-free sweets. What foods are not recommended? The items listed may not be a complete list. Talk with your dietitian about what dietary choices are  best for you. Grains Baked goods made with fat, such as croissants, muffins, or some breads. Dry pasta or rice meal packs. Vegetables Creamed or fried vegetables. Vegetables in a cheese sauce. Regular canned vegetables (not low-sodium or reduced-sodium). Regular canned tomato sauce and paste (not low-sodium or reduced-sodium). Regular tomato and vegetable juice (not low-sodium or reduced-sodium). Angie Fava. Olives. Fruits Canned fruit in a light or heavy syrup. Fried fruit. Fruit in cream or butter sauce. Meat and other protein foods Fatty cuts of meat. Ribs. Fried meat. Berniece Salines. Sausage. Bologna and other processed lunch meats. Salami. Fatback. Hotdogs. Bratwurst. Salted nuts and seeds. Canned beans with added salt. Canned or smoked fish. Whole eggs or egg yolks. Chicken or [place] with skin. Dairy Whole or 2% milk, cream, and half-and-half. Whole or full-fat cream cheese. Whole-fat or sweetened yogurt. Full-fat cheese. Nondairy creamers. Whipped toppings. Processed cheese and cheese spreads. Fats and oils Butter. Stick margarine. Lard. Shortening. Ghee. Bacon fat. Tropical oils, such as coconut, palm kernel, or palm oil. Seasoning and other foods Salted popcorn and pretzels. Onion salt, garlic salt, seasoned salt, table salt, and sea salt. Worcestershire sauce. Tartar sauce. Barbecue sauce. Teriyaki sauce. Soy sauce, including reduced-sodium. Steak sauce. Canned and packaged gravies. Fish sauce. Oyster sauce. Cocktail sauce. Horseradish that you find on the shelf. Ketchup. Mustard. Meat flavorings and tenderizers.  Bouillon cubes. Hot sauce and Tabasco sauce. Premade or packaged marinades. Premade or packaged taco seasonings. Relishes. Regular salad dressings. Where to find more information:  National Heart, Lung, and [place]: https://wilson-eaton.com/  American Heart Association: www.heart.org Summary  The DASH eating plan is a healthy eating plan that has been shown to reduce high blood pressure (hypertension). It may also reduce your risk for type 2 diabetes, heart disease, and stroke.  With the DASH eating plan, you should limit salt (sodium) intake to 2,300 mg a day. If you have hypertension, you may need to reduce your sodium intake to 1,500 mg a day.  When on the DASH eating plan, aim to eat more fresh fruits and vegetables, whole grains, lean proteins, low-fat dairy, and heart-healthy fats.  Work with your health care provider or diet and nutrition specialist (dietitian) to adjust your eating plan to your individual calorie needs. This information is not intended to replace advice given to you by your health care provider. Make sure you discuss any questions you have with your health care provider. Document Released: 10/18/2011 Document Revised: 10/11/2017 Document Reviewed: 10/22/2016 Elsevier Patient Education  2020 [employer].

## 2019-07-07 LAB — CBC WITH DIFFERENTIAL/PLATELET
Basophils Absolute: 0 10*3/uL (ref 0.0–0.2)
Basos: 1 %
EOS (ABSOLUTE): 0.1 10*3/uL (ref 0.0–0.4)
Eos: 2 %
Hematocrit: 39 % (ref 34.0–46.6)
Hemoglobin: 11.8 g/dL (ref 11.1–15.9)
Immature Grans (Abs): 0 10*3/uL (ref 0.0–0.1)
Immature Granulocytes: 0 %
Lymphocytes Absolute: 2.8 10*3/uL (ref 0.7–3.1)
Lymphs: 45 %
MCH: 25.7 pg — ABNORMAL LOW (ref 26.6–33.0)
MCHC: 30.3 g/dL — ABNORMAL LOW (ref 31.5–35.7)
MCV: 85 fL (ref 79–97)
Monocytes Absolute: 0.5 10*3/uL (ref 0.1–0.9)
Monocytes: 9 %
Neutrophils Absolute: 2.6 10*3/uL (ref 1.4–7.0)
Neutrophils: 43 %
Platelets: 262 10*3/uL (ref 150–450)
RBC: 4.59 x10E6/uL (ref 3.77–5.28)
RDW: 13.9 % (ref 11.7–15.4)
WBC: 6.1 10*3/uL (ref 3.4–10.8)

## 2019-07-07 LAB — COMPREHENSIVE METABOLIC PANEL
ALT: 13 IU/L (ref 0–32)
AST: 17 IU/L (ref 0–40)
Albumin/Globulin Ratio: 1.8 (ref 1.2–2.2)
Albumin: 4.3 g/dL (ref 3.8–4.8)
Alkaline Phosphatase: 87 IU/L (ref 39–117)
BUN/Creatinine Ratio: 13 (ref 12–28)
BUN: 10 mg/dL (ref 8–27)
Bilirubin Total: 0.5 mg/dL (ref 0.0–1.2)
CO2: 23 mmol/L (ref 20–29)
Calcium: 9.7 mg/dL (ref 8.7–10.3)
Chloride: 103 mmol/L (ref 96–106)
Creatinine, Ser: 0.78 mg/dL (ref 0.57–1.00)
GFR calc Af Amer: 94 mL/min/{1.73_m2} (ref >59)
GFR calc non Af Amer: 82 mL/min/{1.73_m2} (ref >59)
Globulin, Total: 2.4 g/dL (ref 1.5–4.5)
Glucose: 88 mg/dL (ref 65–99)
Potassium: 3.9 mmol/L (ref 3.5–5.2)
Sodium: 141 mmol/L (ref 134–144)
Total Protein: 6.7 g/dL (ref 6.0–8.5)

## 2019-07-07 LAB — LIPID PANEL
Chol/HDL Ratio: 3.2 ratio (ref 0.0–4.4)
Cholesterol, Total: 175 mg/dL (ref 100–199)
HDL: 55 mg/dL (ref >39)
LDL Calculated: 98 mg/dL (ref 0–99)
Triglycerides: 109 mg/dL (ref 0–149)
VLDL Cholesterol Cal: 22 mg/dL (ref 5–40)

## 2019-07-07 LAB — HEMOGLOBIN A1C
Est. average glucose Bld gHb Est-mCnc: 131 mg/dL
Hgb A1c MFr Bld: 6.2 % — ABNORMAL HIGH (ref 4.8–5.6)

## 2019-07-13 ENCOUNTER — Encounter: Payer: Self-pay | Admitting: Radiology

## 2019-12-08 LAB — HM MAMMOGRAPHY

## 2019-12-10 ENCOUNTER — Encounter: Payer: Self-pay | Admitting: *Deleted

## 2019-12-22 ENCOUNTER — Telehealth: Payer: Self-pay | Admitting: *Deleted

## 2019-12-22 NOTE — Telephone Encounter (Signed)
Faxed Rx for Amlodipine 5 mg and Simvastatin 20 mg to [facility]. Confirmation page 1:25 pm.

## 2020-01-06 ENCOUNTER — Ambulatory Visit (INDEPENDENT_AMBULATORY_CARE_PROVIDER_SITE_OTHER): Payer: 59 | Admitting: Emergency Medicine

## 2020-01-06 ENCOUNTER — Other Ambulatory Visit: Payer: Self-pay

## 2020-01-06 ENCOUNTER — Encounter: Payer: Self-pay | Admitting: Emergency Medicine

## 2020-01-06 VITALS — BP 149/81 | HR 77 | Temp 98.2°F | Resp 16 | Ht 64.0 in | Wt 187.0 lb

## 2020-01-06 DIAGNOSIS — E785 Hyperlipidemia, unspecified: Secondary | ICD-10-CM

## 2020-01-06 DIAGNOSIS — R7303 Prediabetes: Secondary | ICD-10-CM | POA: Diagnosis not present

## 2020-01-06 DIAGNOSIS — I10 Essential (primary) hypertension: Secondary | ICD-10-CM

## 2020-01-06 DIAGNOSIS — Z1211 Encounter for screening for malignant neoplasm of colon: Secondary | ICD-10-CM | POA: Diagnosis not present

## 2020-01-06 NOTE — Progress Notes (Signed)
April Saunders 64 y.o.   Chief Complaint  Patient presents with  . Hypertension    6 month follow up     HISTORY OF PRESENT ILLNESS: This is a 64 y.o. female with history of hypertension, dyslipidemia and prediabetes here for follow-up. Doing well.  Has no complaints or medical concerns today. Most recent blood work done last summer reviewed with patient. Presently taking amlodipine 5 mg and simvastatin 20 mg daily.  HPI   Prior to Admission medications   Medication Sig Start Date End Date Taking? Authorizing Provider  Acetaminophen (TYLENOL PO) Take by mouth as needed.   Yes [provider]  Cholecalciferol (VITAMIN D-3) 125 MCG (5000 UT) TABS Take 1 tablet by mouth daily. 04/08/19  Yes Hilts, Legrand Como, MD  [facility] Liver Oil CAPS Take 1 capsule by mouth daily.   Yes [provider]  fish oil-omega-3 fatty acids 1000 MG capsule Take 1 g by mouth daily.   Yes [provider]  amLODipine (NORVASC) 5 MG tablet Take 1 tablet (5 mg total) by mouth daily. 12/30/18 03/30/19  Horald Pollen, MD  nabumetone (RELAFEN) 750 MG tablet Take 1 tablet (750 mg total) by mouth 2 (two) times daily as needed. Patient not taking: Reported on 01/06/2020 04/08/19   Hilts, Legrand Como, MD  simvastatin (ZOCOR) 20 MG tablet Take 1 tablet (20 mg total) by mouth daily. 12/30/18 03/30/19  Horald Pollen, MD  tiZANidine (ZANAFLEX) 2 MG tablet Take 1-2 tablets (2-4 mg total) by mouth every 6 (six) hours as needed for muscle spasms. Patient not taking: Reported on 01/06/2020 04/08/19   Eunice Blase, MD    Allergies  Allergen Reactions  . Atorvastatin Calcium Itching  . Lipitor [Atorvastatin]     Weakness   . Lisinopril Other (See Comments)    Shoulder pain    Patient Active Problem List   Diagnosis Date Noted  . Dyslipidemia 12/30/2018  . Arthritis, low back 12/18/2011  . Hypertension   . Obesity     Past Medical History:  Diagnosis Date  . Abnormal MRI    brain  . DJD  (degenerative joint disease)    multiple joints  . Hyperlipidemia   . Hypertension   . Lipoma    rt ankle  . Obesity   . Trigeminal neuralgia     Past Surgical History:  Procedure Laterality Date  . ABDOMINAL HYSTERECTOMY     ? ovaries    Social History   Socioeconomic History  . Marital status: Married    Spouse name: Jameca Mork  . Number of children: 3  . Years of education: 11th grade  . Highest education level: Not on file  Occupational History  . 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 Smoking status: Former Research scientist (life sciences)  . Smokeless tobacco: Never Used  . Tobacco comment: 2008  Substance and Sexual Activity  . Alcohol use: No    Alcohol/week: 0.0 standard drinks  . Drug use: No  . Sexual activity: Yes    Partners: Male    Birth control/protection: Surgical  Other Topics Concern  . Not on file  Social History Narrative   Lives with her husband. Three adult child live independently and nearby.   Social Determinants of Health   Financial Resource Strain:   . Difficulty of Paying Living Expenses: Not on file  Food Insecurity:   . Worried About Charity fundraiser in the Last Year: Not on file  .  Ran Out of Food in the Last Year: Not on file  Transportation Needs:   . Lack of Transportation (Medical): Not on file  . Lack of Transportation (Non-Medical): Not on file  Physical Activity:   . Days of Exercise per Week: Not on file  . Minutes of Exercise per Session: Not on file  Stress:   . Feeling of Stress : Not on file  Social Connections:   . Frequency of Communication with Friends and Family: Not on file  . Frequency of Social Gatherings with Friends and Family: Not on file  . Attends Religious Services: Not on file  . Active Member of Clubs or Organizations: Not on file  . Attends Archivist Meetings: Not on file  . Marital Status: Not on file  Intimate Partner Violence:   . Fear of Current or Ex-Partner: Not on file   . Emotionally Abused: Not on file  . Physically Abused: Not on file  . Sexually Abused: Not on file    Family History  Problem Relation Age of Onset  . Diabetes Mellitus I Mother   . Hypertension Brother   . Diabetes Mellitus I Brother      Review of Systems  Constitutional: Negative.  Negative for chills and fever.  HENT: Negative.  Negative for congestion and sore throat.   Respiratory: Negative.  Negative for cough and shortness of breath.   Cardiovascular: Negative.  Negative for chest pain and palpitations.  Gastrointestinal: Negative.  Negative for abdominal pain, blood in stool, diarrhea, melena, nausea and vomiting.  Genitourinary: Negative.  Negative for dysuria and hematuria.  Musculoskeletal: Negative.  Negative for back pain, myalgias and neck pain.  Skin: Negative.  Negative for rash.  Neurological: Negative.  Negative for dizziness and headaches.  Endo/Heme/Allergies: Negative.   All other systems reviewed and are negative.    Today's Vitals   01/06/20 1542  BP: (!) 149/81  Pulse: 77  Resp: 16  Temp: 98.2 F (36.8 C)  TempSrc: Temporal  SpO2: 98%  Weight: 187 lb (84.8 kg)  Height: 5\' 4"  (1.626 m)   Body mass index is 32.1 kg/m.   Physical Exam Vitals reviewed.  Constitutional:      Appearance: Normal appearance.  HENT:     Head: Normocephalic.  Eyes:     Extraocular Movements: Extraocular movements intact.     Conjunctiva/sclera: Conjunctivae normal.     Pupils: Pupils are equal, round, and reactive to light.  Cardiovascular:     Rate and Rhythm: Normal rate and regular rhythm.     Pulses: Normal pulses.     Heart sounds: Normal heart sounds.  Pulmonary:     Effort: Pulmonary effort is normal.     Breath sounds: Normal breath sounds.  Abdominal:     General: There is no distension.     Palpations: Abdomen is soft.     Tenderness: There is no abdominal tenderness.  Musculoskeletal:        General: Normal range of motion.     Cervical  back: Normal range of motion and neck supple.  Skin:    General: Skin is warm and dry.     Capillary Refill: Capillary refill takes less than 2 seconds.  Neurological:     General: No focal deficit present.     Mental Status: She is alert and oriented to person, place, and time.  Psychiatric:        Mood and Affect: Mood normal.  Behavior: Behavior normal.    A total of 30 minutes was spent with the patient, greater than 50% of which was in counseling/coordination of care regarding chronic medical problems including hypertension and cardiovascular risks associated with, review of most recent office visit notes, review of most recent blood work, review of all medications and problem list, diet and nutrition, prognosis and need for follow-up.   ASSESSMENT & PLAN: Clinically stable.  No medical concerns identified during this visit. Continue present medications.  No changes. Follow-up in 6 months.  Jazline was seen today for hypertension.  Diagnoses and all orders for this visit:  Essential hypertension -     Comprehensive metabolic panel  Dyslipidemia -     Lipid panel  Prediabetes -     Hemoglobin A1c  Colon cancer screening -     Ambulatory referral to Gastroenterology    Patient Instructions       If you have lab work done today you will be contacted with your lab results within the next 2 weeks.  If you have not heard from [place] then please contact us. The fastest way to get your results is to register for My Chart.   IF you received an x-ray today, you will receive an invoice from [facility]. Please contact [facility] at [phone] with questions or concerns regarding your invoice.   IF you received labwork today, you will receive an invoice from [facility]. Please contact [facility] at [phone] with questions or concerns regarding your invoice.   Our billing staff will not be able to assist you with questions regarding bills from these  companies.  You will be contacted with the lab results as soon as they are available. The fastest way to get your results is to activate your My Chart account. Instructions are located on the last page of this paperwork. If you have not heard from [place] regarding the results in 2 weeks, please contact this office.      DASH Eating Plan DASH stands for "Dietary Approaches to Stop Hypertension." The DASH eating plan is a healthy eating plan that has been shown to reduce high blood pressure (hypertension). It may also reduce your risk for type 2 diabetes, heart disease, and stroke. The DASH eating plan may also help with weight loss. What are tips for following this plan?  General guidelines  Avoid eating more than 2,300 mg (milligrams) of salt (sodium) a day. If you have hypertension, you may need to reduce your sodium intake to 1,500 mg a day.  Limit alcohol intake to no more than 1 drink a day for nonpregnant women and 2 drinks a day for men. One drink equals 12 oz of beer, 5 oz of wine, or 1 oz of hard liquor.  Work with your health care provider to maintain a healthy body weight or to lose weight. Ask what an ideal weight is for you.  Get at least 30 minutes of exercise that causes your heart to beat faster (aerobic exercise) most days of the week. Activities may include walking, swimming, or biking.  Work with your health care provider or diet and nutrition specialist (dietitian) to adjust your eating plan to your individual calorie needs. Reading food labels   Check food labels for the amount of sodium per serving. Choose foods with less than 5 percent of the Daily Value of sodium. Generally, foods with less than 300 mg of sodium per serving fit into this eating plan.  To find whole grains, look for  the word "whole" as the first word in the ingredient list. Shopping  Buy products labeled as "low-sodium" or "no salt added."  Buy fresh foods. Avoid canned foods and premade or frozen  meals. Cooking  Avoid adding salt when cooking. Use salt-free seasonings or herbs instead of table salt or sea salt. Check with your health care provider or pharmacist before using salt substitutes.  Do not fry foods. Cook foods using healthy methods such as baking, boiling, grilling, and broiling instead.  Cook with heart-healthy oils, such as olive, canola, soybean, or sunflower oil. Meal planning  Eat a balanced diet that includes: ? 5 or more servings of fruits and vegetables each day. At each meal, try to fill half of your plate with fruits and vegetables. ? Up to 6-8 servings of whole grains each day. ? Less than 6 oz of lean meat, poultry, or fish each day. A 3-oz serving of meat is about the same size as a deck of cards. One egg equals 1 oz. ? 2 servings of low-fat dairy each day. ? A serving of nuts, seeds, or beans 5 times each week. ? Heart-healthy fats. Healthy fats called Omega-3 fatty acids are found in foods such as flaxseeds and coldwater fish, like sardines, salmon, and mackerel.  Limit how much you eat of the following: ? Canned or prepackaged foods. ? Food that is high in trans fat, such as fried foods. ? Food that is high in saturated fat, such as fatty meat. ? Sweets, desserts, sugary drinks, and other foods with added sugar. ? Full-fat dairy products.  Do not salt foods before eating.  Try to eat at least 2 vegetarian meals each week.  Eat more home-cooked food and less restaurant, buffet, and fast food.  When eating at a restaurant, ask that your food be prepared with less salt or no salt, if possible. What foods are recommended? The items listed may not be a complete list. Talk with your dietitian about what dietary choices are best for you. Grains Whole-grain or whole-wheat bread. Whole-grain or whole-wheat pasta. Brown rice. Modena Morrow. Bulgur. Whole-grain and low-sodium cereals. Pita bread. Low-fat, low-sodium crackers. Whole-wheat flour  tortillas. Vegetables Fresh or frozen vegetables (raw, steamed, roasted, or grilled). Low-sodium or reduced-sodium tomato and vegetable juice. Low-sodium or reduced-sodium tomato sauce and tomato paste. Low-sodium or reduced-sodium canned vegetables. Fruits All fresh, dried, or frozen fruit. Canned fruit in natural juice (without added sugar). Meat and other protein foods Skinless chicken or [place]. Ground chicken or [place]. Pork with fat trimmed off. Fish and seafood. Egg whites. Dried beans, peas, or lentils. Unsalted nuts, nut butters, and seeds. Unsalted canned beans. Lean cuts of beef with fat trimmed off. Low-sodium, lean deli meat. Dairy Low-fat (1%) or fat-free (skim) milk. Fat-free, low-fat, or reduced-fat cheeses. Nonfat, low-sodium ricotta or cottage cheese. Low-fat or nonfat yogurt. Low-fat, low-sodium cheese. Fats and oils Soft margarine without trans fats. Vegetable oil. Low-fat, reduced-fat, or light mayonnaise and salad dressings (reduced-sodium). Canola, safflower, olive, soybean, and sunflower oils. Avocado. Seasoning and other foods Herbs. Spices. Seasoning mixes without salt. Unsalted popcorn and pretzels. Fat-free sweets. What foods are not recommended? The items listed may not be a complete list. Talk with your dietitian about what dietary choices are best for you. Grains Baked goods made with fat, such as croissants, muffins, or some breads. Dry pasta or rice meal packs. Vegetables Creamed or fried vegetables. Vegetables in a cheese sauce. Regular canned vegetables (not low-sodium or reduced-sodium). Regular canned tomato  sauce and paste (not low-sodium or reduced-sodium). Regular tomato and vegetable juice (not low-sodium or reduced-sodium). Angie Fava. Olives. Fruits Canned fruit in a light or heavy syrup. Fried fruit. Fruit in cream or butter sauce. Meat and other protein foods Fatty cuts of meat. Ribs. Fried meat. Berniece Salines. Sausage. Bologna and other processed lunch meats.  Salami. Fatback. Hotdogs. Bratwurst. Salted nuts and seeds. Canned beans with added salt. Canned or smoked fish. Whole eggs or egg yolks. Chicken or [place] with skin. Dairy Whole or 2% milk, cream, and half-and-half. Whole or full-fat cream cheese. Whole-fat or sweetened yogurt. Full-fat cheese. Nondairy creamers. Whipped toppings. Processed cheese and cheese spreads. Fats and oils Butter. Stick margarine. Lard. Shortening. Ghee. Bacon fat. Tropical oils, such as coconut, palm kernel, or palm oil. Seasoning and other foods Salted popcorn and pretzels. Onion salt, garlic salt, seasoned salt, table salt, and sea salt. Worcestershire sauce. Tartar sauce. Barbecue sauce. Teriyaki sauce. Soy sauce, including reduced-sodium. Steak sauce. Canned and packaged gravies. Fish sauce. Oyster sauce. Cocktail sauce. Horseradish that you find on the shelf. Ketchup. Mustard. Meat flavorings and tenderizers. Bouillon cubes. Hot sauce and Tabasco sauce. Premade or packaged marinades. Premade or packaged taco seasonings. Relishes. Regular salad dressings. Where to find more information:  National Heart, Lung, and [place]: https://wilson-eaton.com/  American Heart Association: www.heart.org Summary  The DASH eating plan is a healthy eating plan that has been shown to reduce high blood pressure (hypertension). It may also reduce your risk for type 2 diabetes, heart disease, and stroke.  With the DASH eating plan, you should limit salt (sodium) intake to 2,300 mg a day. If you have hypertension, you may need to reduce your sodium intake to 1,500 mg a day.  When on the DASH eating plan, aim to eat more fresh fruits and vegetables, whole grains, lean proteins, low-fat dairy, and heart-healthy fats.  Work with your health care provider or diet and nutrition specialist (dietitian) to adjust your eating plan to your individual calorie needs. This information is not intended to replace advice given to you by your health  care provider. Make sure you discuss any questions you have with your health care provider. Document Revised: 10/11/2017 Document Reviewed: 10/22/2016 Elsevier Patient Education  [place].  Hypertension, Adult High blood pressure (hypertension) is when the force of blood pumping through the arteries is too strong. The arteries are the blood vessels that carry blood from the heart throughout the body. Hypertension forces the heart to work harder to pump blood and may cause arteries to become narrow or stiff. Untreated or uncontrolled hypertension can cause a heart attack, heart failure, a stroke, kidney disease, and other problems. A blood pressure reading consists of a higher number over a lower number. Ideally, your blood pressure should be below 120/80. The first ("top") number is called the systolic pressure. It is a measure of the pressure in your arteries as your heart beats. The second ("bottom") number is called the diastolic pressure. It is a measure of the pressure in your arteries as the heart relaxes. What are the causes? The exact cause of this condition is not known. There are some conditions that result in or are related to high blood pressure. What increases the risk? Some risk factors for high blood pressure are under your control. The following factors may make you more likely to develop this condition:  Smoking.  Having type 2 diabetes mellitus, high cholesterol, or both.  Not getting enough exercise or physical activity.  Being  overweight.  Having too much fat, sugar, calories, or salt (sodium) in your diet.  Drinking too much alcohol. Some risk factors for high blood pressure may be difficult or impossible to change. Some of these factors include:  Having chronic kidney disease.  Having a family history of high blood pressure.  Age. Risk increases with age.  Race. You may be at higher risk if you are African American.  Gender. Men are at higher risk than  women before age 19. After age 73, women are at higher risk than men.  Having obstructive sleep apnea.  Stress. What are the signs or symptoms? High blood pressure may not cause symptoms. Very high blood pressure (hypertensive crisis) may cause:  Headache.  Anxiety.  Shortness of breath.  Nosebleed.  Nausea and vomiting.  Vision changes.  Severe chest pain.  Seizures. How is this diagnosed? This condition is diagnosed by measuring your blood pressure while you are seated, with your arm resting on a flat surface, your legs uncrossed, and your feet flat on the floor. The cuff of the blood pressure monitor will be placed directly against the skin of your upper arm at the level of your heart. It should be measured at least twice using the same arm. Certain conditions can cause a difference in blood pressure between your right and left arms. Certain factors can cause blood pressure readings to be lower or higher than normal for a short period of time:  When your blood pressure is higher when you are in a health care provider's office than when you are at home, this is called white coat hypertension. Most people with this condition do not need medicines.  When your blood pressure is higher at home than when you are in a health care provider's office, this is called masked hypertension. Most people with this condition may need medicines to control blood pressure. If you have a high blood pressure reading during one visit or you have normal blood pressure with other risk factors, you may be asked to:  Return on a different day to have your blood pressure checked again.  Monitor your blood pressure at home for 1 week or longer. If you are diagnosed with hypertension, you may have other blood or imaging tests to help your health care provider understand your overall risk for other conditions. How is this treated? This condition is treated by making healthy lifestyle changes, such as eating  healthy foods, exercising more, and reducing your alcohol intake. Your health care provider may prescribe medicine if lifestyle changes are not enough to get your blood pressure under control, and if:  Your systolic blood pressure is above 130.  Your diastolic blood pressure is above 80. Your personal target blood pressure may vary depending on your medical conditions, your age, and other factors. Follow these instructions at home: Eating and drinking   Eat a diet that is high in fiber and potassium, and low in sodium, added sugar, and fat. An example eating plan is called the DASH (Dietary Approaches to Stop Hypertension) diet. To eat this way: ? Eat plenty of fresh fruits and vegetables. Try to fill one half of your plate at each meal with fruits and vegetables. ? Eat whole grains, such as whole-wheat pasta, brown rice, or whole-grain bread. Fill about one fourth of your plate with whole grains. ? Eat or drink low-fat dairy products, such as skim milk or low-fat yogurt. ? Avoid fatty cuts of meat, processed or cured meats, and poultry  with skin. Fill about one fourth of your plate with lean proteins, such as fish, chicken without skin, beans, eggs, or tofu. ? Avoid pre-made and processed foods. These tend to be higher in sodium, added sugar, and fat.  Reduce your daily sodium intake. Most people with hypertension should eat less than 1,500 mg of sodium a day.  Do not drink alcohol if: ? Your health care provider tells you not to drink. ? You are pregnant, may be pregnant, or are planning to become pregnant.  If you drink alcohol: ? Limit how much you use to:  0-1 drink a day for women.  0-2 drinks a day for men. ? Be aware of how much alcohol is in your drink. In the [place] one drink equals one 12 oz bottle of beer (355 mL), one 5 oz glass of wine (148 mL), or one 1 oz glass of hard liquor (44 mL). Lifestyle   Work with your health care provider to maintain a healthy body weight or  to lose weight. Ask what an ideal weight is for you.  Get at least 30 minutes of exercise most days of the week. Activities may include walking, swimming, or biking.  Include exercise to strengthen your muscles (resistance exercise), such as Pilates or lifting weights, as part of your weekly exercise routine. Try to do these types of exercises for 30 minutes at least 3 days a week.  Do not use any products that contain nicotine or tobacco, such as cigarettes, e-cigarettes, and chewing tobacco. If you need help quitting, ask your health care provider.  Monitor your blood pressure at home as told by your health care provider.  Keep all follow-up visits as told by your health care provider. This is important. Medicines  Take over-the-counter and prescription medicines only as told by your health care provider. Follow directions carefully. Blood pressure medicines must be taken as prescribed.  Do not skip doses of blood pressure medicine. Doing this puts you at risk for problems and can make the medicine less effective.  Ask your health care provider about side effects or reactions to medicines that you should watch for. Contact a health care provider if you:  Think you are having a reaction to a medicine you are taking.  Have headaches that keep coming back (recurring).  Feel dizzy.  Have swelling in your ankles.  Have trouble with your vision. Get help right away if you:  Develop a severe headache or confusion.  Have unusual weakness or numbness.  Feel faint.  Have severe pain in your chest or abdomen.  Vomit repeatedly.  Have trouble breathing. Summary  Hypertension is when the force of blood pumping through your arteries is too strong. If this condition is not controlled, it may put you at risk for serious complications.  Your personal target blood pressure may vary depending on your medical conditions, your age, and other factors. For most people, a normal blood  pressure is less than 120/80.  Hypertension is treated with lifestyle changes, medicines, or a combination of both. Lifestyle changes include losing weight, eating a healthy, low-sodium diet, exercising more, and limiting alcohol. This information is not intended to replace advice given to you by your health care provider. Make sure you discuss any questions you have with your health care provider. Document Revised: 07/09/2018 Document Reviewed: 07/09/2018 Elsevier Patient Education  2020 Elsevier Inc.      Agustina Caroli, MD [facility]

## 2020-01-06 NOTE — Patient Instructions (Addendum)
   If you have lab work done today you will be contacted with your lab results within the next 2 weeks.  If you have not heard from us then please contact us. The fastest way to get your results is to register for My Chart.   IF you received an x-ray today, you will receive an invoice from [facility]. Please contact [facility] at [phone] with questions or concerns regarding your invoice.   IF you received labwork today, you will receive an invoice from [facility]. Please contact [facility] at [phone] with questions or concerns regarding your invoice.   Our billing staff will not be able to assist you with questions regarding bills from these companies.  You will be contacted with the lab results as soon as they are available. The fastest way to get your results is to activate your My Chart account. Instructions are located on the last page of this paperwork. If you have not heard from us regarding the results in 2 weeks, please contact this office.      DASH Eating Plan DASH stands for "Dietary Approaches to Stop Hypertension." The DASH eating plan is a healthy eating plan that has been shown to reduce high blood pressure (hypertension). It may also reduce your risk for type 2 diabetes, heart disease, and stroke. The DASH eating plan may also help with weight loss. What are tips for following this plan?  General guidelines  Avoid eating more than 2,300 mg (milligrams) of salt (sodium) a day. If you have hypertension, you may need to reduce your sodium intake to 1,500 mg a day.  Limit alcohol intake to no more than 1 drink a day for nonpregnant women and 2 drinks a day for men. One drink equals 12 oz of beer, 5 oz of wine, or 1 oz of hard liquor.  Work with your health care provider to maintain a healthy body weight or to lose weight. Ask what an ideal weight is for you.  Get at least 30 minutes of exercise that causes your heart to beat faster (aerobic  exercise) most days of the week. Activities may include walking, swimming, or biking.  Work with your health care provider or diet and nutrition specialist (dietitian) to adjust your eating plan to your individual calorie needs. Reading food labels   Check food labels for the amount of sodium per serving. Choose foods with less than 5 percent of the Daily Value of sodium. Generally, foods with less than 300 mg of sodium per serving fit into this eating plan.  To find whole grains, look for the word "whole" as the first word in the ingredient list. Shopping  Buy products labeled as "low-sodium" or "no salt added."  Buy fresh foods. Avoid canned foods and premade or frozen meals. Cooking  Avoid adding salt when cooking. Use salt-free seasonings or herbs instead of table salt or sea salt. Check with your health care provider or pharmacist before using salt substitutes.  Do not fry foods. Cook foods using healthy methods such as baking, boiling, grilling, and broiling instead.  Cook with heart-healthy oils, such as olive, canola, soybean, or sunflower oil. Meal planning  Eat a balanced diet that includes: ? 5 or more servings of fruits and vegetables each day. At each meal, try to fill half of your plate with fruits and vegetables. ? Up to 6-8 servings of whole grains each day. ? Less than 6 oz of lean meat, poultry, or fish each day. A 3-oz   serving of meat is about the same size as a deck of cards. One egg equals 1 oz. ? 2 servings of low-fat dairy each day. ? A serving of nuts, seeds, or beans 5 times each week. ? Heart-healthy fats. Healthy fats called Omega-3 fatty acids are found in foods such as flaxseeds and coldwater fish, like sardines, salmon, and mackerel.  Limit how much you eat of the following: ? Canned or prepackaged foods. ? Food that is high in trans fat, such as fried foods. ? Food that is high in saturated fat, such as fatty meat. ? Sweets, desserts, sugary drinks,  and other foods with added sugar. ? Full-fat dairy products.  Do not salt foods before eating.  Try to eat at least 2 vegetarian meals each week.  Eat more home-cooked food and less restaurant, buffet, and fast food.  When eating at a restaurant, ask that your food be prepared with less salt or no salt, if possible. What foods are recommended? The items listed may not be a complete list. Talk with your dietitian about what dietary choices are best for you. Grains Whole-grain or whole-wheat bread. Whole-grain or whole-wheat pasta. Brown rice. Oatmeal. Quinoa. Bulgur. Whole-grain and low-sodium cereals. Pita bread. Low-fat, low-sodium crackers. Whole-wheat flour tortillas. Vegetables Fresh or frozen vegetables (raw, steamed, roasted, or grilled). Low-sodium or reduced-sodium tomato and vegetable juice. Low-sodium or reduced-sodium tomato sauce and tomato paste. Low-sodium or reduced-sodium canned vegetables. Fruits All fresh, dried, or frozen fruit. Canned fruit in natural juice (without added sugar). Meat and other protein foods Skinless chicken or turkey. Ground chicken or turkey. Pork with fat trimmed off. Fish and seafood. Egg whites. Dried beans, peas, or lentils. Unsalted nuts, nut butters, and seeds. Unsalted canned beans. Lean cuts of beef with fat trimmed off. Low-sodium, lean deli meat. Dairy Low-fat (1%) or fat-free (skim) milk. Fat-free, low-fat, or reduced-fat cheeses. Nonfat, low-sodium ricotta or cottage cheese. Low-fat or nonfat yogurt. Low-fat, low-sodium cheese. Fats and oils Soft margarine without trans fats. Vegetable oil. Low-fat, reduced-fat, or light mayonnaise and salad dressings (reduced-sodium). Canola, safflower, olive, soybean, and sunflower oils. Avocado. Seasoning and other foods Herbs. Spices. Seasoning mixes without salt. Unsalted popcorn and pretzels. Fat-free sweets. What foods are not recommended? The items listed may not be a complete list. Talk with your  dietitian about what dietary choices are best for you. Grains Baked goods made with fat, such as croissants, muffins, or some breads. Dry pasta or rice meal packs. Vegetables Creamed or fried vegetables. Vegetables in a cheese sauce. Regular canned vegetables (not low-sodium or reduced-sodium). Regular canned tomato sauce and paste (not low-sodium or reduced-sodium). Regular tomato and vegetable juice (not low-sodium or reduced-sodium). Pickles. Olives. Fruits Canned fruit in a light or heavy syrup. Fried fruit. Fruit in cream or butter sauce. Meat and other protein foods Fatty cuts of meat. Ribs. Fried meat. Bacon. Sausage. Bologna and other processed lunch meats. Salami. Fatback. Hotdogs. Bratwurst. Salted nuts and seeds. Canned beans with added salt. Canned or smoked fish. Whole eggs or egg yolks. Chicken or turkey with skin. Dairy Whole or 2% milk, cream, and half-and-half. Whole or full-fat cream cheese. Whole-fat or sweetened yogurt. Full-fat cheese. Nondairy creamers. Whipped toppings. Processed cheese and cheese spreads. Fats and oils Butter. Stick margarine. Lard. Shortening. Ghee. Bacon fat. Tropical oils, such as coconut, palm kernel, or palm oil. Seasoning and other foods Salted popcorn and pretzels. Onion salt, garlic salt, seasoned salt, table salt, and sea salt. Worcestershire sauce. Tartar sauce. Barbecue   sauce. Teriyaki sauce. Soy sauce, including reduced-sodium. Steak sauce. Canned and packaged gravies. Fish sauce. Oyster sauce. Cocktail sauce. Horseradish that you find on the shelf. Ketchup. Mustard. Meat flavorings and tenderizers. Bouillon cubes. Hot sauce and Tabasco sauce. Premade or packaged marinades. Premade or packaged taco seasonings. Relishes. Regular salad dressings. Where to find more information:  National Heart, Lung, and Blood Institute: www.nhlbi.nih.gov  American Heart Association: www.heart.org Summary  The DASH eating plan is a healthy eating plan that has  been shown to reduce high blood pressure (hypertension). It may also reduce your risk for type 2 diabetes, heart disease, and stroke.  With the DASH eating plan, you should limit salt (sodium) intake to 2,300 mg a day. If you have hypertension, you may need to reduce your sodium intake to 1,500 mg a day.  When on the DASH eating plan, aim to eat more fresh fruits and vegetables, whole grains, lean proteins, low-fat dairy, and heart-healthy fats.  Work with your health care provider or diet and nutrition specialist (dietitian) to adjust your eating plan to your individual calorie needs. This information is not intended to replace advice given to you by your health care provider. Make sure you discuss any questions you have with your health care provider. Document Revised: 10/11/2017 Document Reviewed: 10/22/2016 Elsevier Patient Education  2020 Elsevier Inc.  Hypertension, Adult High blood pressure (hypertension) is when the force of blood pumping through the arteries is too strong. The arteries are the blood vessels that carry blood from the heart throughout the body. Hypertension forces the heart to work harder to pump blood and may cause arteries to become narrow or stiff. Untreated or uncontrolled hypertension can cause a heart attack, heart failure, a stroke, kidney disease, and other problems. A blood pressure reading consists of a higher number over a lower number. Ideally, your blood pressure should be below 120/80. The first ("top") number is called the systolic pressure. It is a measure of the pressure in your arteries as your heart beats. The second ("bottom") number is called the diastolic pressure. It is a measure of the pressure in your arteries as the heart relaxes. What are the causes? The exact cause of this condition is not known. There are some conditions that result in or are related to high blood pressure. What increases the risk? Some risk factors for high blood pressure are  under your control. The following factors may make you more likely to develop this condition:  Smoking.  Having type 2 diabetes mellitus, high cholesterol, or both.  Not getting enough exercise or physical activity.  Being overweight.  Having too much fat, sugar, calories, or salt (sodium) in your diet.  Drinking too much alcohol. Some risk factors for high blood pressure may be difficult or impossible to change. Some of these factors include:  Having chronic kidney disease.  Having a family history of high blood pressure.  Age. Risk increases with age.  Race. You may be at higher risk if you are African American.  Gender. Men are at higher risk than women before age 45. After age 65, women are at higher risk than men.  Having obstructive sleep apnea.  Stress. What are the signs or symptoms? High blood pressure may not cause symptoms. Very high blood pressure (hypertensive crisis) may cause:  Headache.  Anxiety.  Shortness of breath.  Nosebleed.  Nausea and vomiting.  Vision changes.  Severe chest pain.  Seizures. How is this diagnosed? This condition is diagnosed by measuring your   blood pressure while you are seated, with your arm resting on a flat surface, your legs uncrossed, and your feet flat on the floor. The cuff of the blood pressure monitor will be placed directly against the skin of your upper arm at the level of your heart. It should be measured at least twice using the same arm. Certain conditions can cause a difference in blood pressure between your right and left arms. Certain factors can cause blood pressure readings to be lower or higher than normal for a short period of time:  When your blood pressure is higher when you are in a health care provider's office than when you are at home, this is called white coat hypertension. Most people with this condition do not need medicines.  When your blood pressure is higher at home than when you are in a  health care provider's office, this is called masked hypertension. Most people with this condition may need medicines to control blood pressure. If you have a high blood pressure reading during one visit or you have normal blood pressure with other risk factors, you may be asked to:  Return on a different day to have your blood pressure checked again.  Monitor your blood pressure at home for 1 week or longer. If you are diagnosed with hypertension, you may have other blood or imaging tests to help your health care provider understand your overall risk for other conditions. How is this treated? This condition is treated by making healthy lifestyle changes, such as eating healthy foods, exercising more, and reducing your alcohol intake. Your health care provider may prescribe medicine if lifestyle changes are not enough to get your blood pressure under control, and if:  Your systolic blood pressure is above 130.  Your diastolic blood pressure is above 80. Your personal target blood pressure may vary depending on your medical conditions, your age, and other factors. Follow these instructions at home: Eating and drinking   Eat a diet that is high in fiber and potassium, and low in sodium, added sugar, and fat. An example eating plan is called the DASH (Dietary Approaches to Stop Hypertension) diet. To eat this way: ? Eat plenty of fresh fruits and vegetables. Try to fill one half of your plate at each meal with fruits and vegetables. ? Eat whole grains, such as whole-wheat pasta, brown rice, or whole-grain bread. Fill about one fourth of your plate with whole grains. ? Eat or drink low-fat dairy products, such as skim milk or low-fat yogurt. ? Avoid fatty cuts of meat, processed or cured meats, and poultry with skin. Fill about one fourth of your plate with lean proteins, such as fish, chicken without skin, beans, eggs, or tofu. ? Avoid pre-made and processed foods. These tend to be higher in  sodium, added sugar, and fat.  Reduce your daily sodium intake. Most people with hypertension should eat less than 1,500 mg of sodium a day.  Do not drink alcohol if: ? Your health care provider tells you not to drink. ? You are pregnant, may be pregnant, or are planning to become pregnant.  If you drink alcohol: ? Limit how much you use to:  0-1 drink a day for women.  0-2 drinks a day for men. ? Be aware of how much alcohol is in your drink. In the [place] one drink equals one 12 oz bottle of beer (355 mL), one 5 oz glass of wine (148 mL), or one 1 oz glass of hard liquor (  44 mL). Lifestyle   Work with your health care provider to maintain a healthy body weight or to lose weight. Ask what an ideal weight is for you.  Get at least 30 minutes of exercise most days of the week. Activities may include walking, swimming, or biking.  Include exercise to strengthen your muscles (resistance exercise), such as Pilates or lifting weights, as part of your weekly exercise routine. Try to do these types of exercises for 30 minutes at least 3 days a week.  Do not use any products that contain nicotine or tobacco, such as cigarettes, e-cigarettes, and chewing tobacco. If you need help quitting, ask your health care provider.  Monitor your blood pressure at home as told by your health care provider.  Keep all follow-up visits as told by your health care provider. This is important. Medicines  Take over-the-counter and prescription medicines only as told by your health care provider. Follow directions carefully. Blood pressure medicines must be taken as prescribed.  Do not skip doses of blood pressure medicine. Doing this puts you at risk for problems and can make the medicine less effective.  Ask your health care provider about side effects or reactions to medicines that you should watch for. Contact a health care provider if you:  Think you are having a reaction to a medicine you are  taking.  Have headaches that keep coming back (recurring).  Feel dizzy.  Have swelling in your ankles.  Have trouble with your vision. Get help right away if you:  Develop a severe headache or confusion.  Have unusual weakness or numbness.  Feel faint.  Have severe pain in your chest or abdomen.  Vomit repeatedly.  Have trouble breathing. Summary  Hypertension is when the force of blood pumping through your arteries is too strong. If this condition is not controlled, it may put you at risk for serious complications.  Your personal target blood pressure may vary depending on your medical conditions, your age, and other factors. For most people, a normal blood pressure is less than 120/80.  Hypertension is treated with lifestyle changes, medicines, or a combination of both. Lifestyle changes include losing weight, eating a healthy, low-sodium diet, exercising more, and limiting alcohol. This information is not intended to replace advice given to you by your health care provider. Make sure you discuss any questions you have with your health care provider. Document Revised: 07/09/2018 Document Reviewed: 07/09/2018 Elsevier Patient Education  2020 Elsevier Inc.  

## 2020-01-07 LAB — COMPREHENSIVE METABOLIC PANEL
ALT: 19 IU/L (ref 0–32)
AST: 25 IU/L (ref 0–40)
Albumin/Globulin Ratio: 1.4 (ref 1.2–2.2)
Albumin: 4.1 g/dL (ref 3.8–4.8)
Alkaline Phosphatase: 99 IU/L (ref 39–117)
BUN/Creatinine Ratio: 21 (ref 12–28)
BUN: 16 mg/dL (ref 8–27)
Bilirubin Total: 0.3 mg/dL (ref 0.0–1.2)
CO2: 23 mmol/L (ref 20–29)
Calcium: 9.9 mg/dL (ref 8.7–10.3)
Chloride: 102 mmol/L (ref 96–106)
Creatinine, Ser: 0.78 mg/dL (ref 0.57–1.00)
GFR calc Af Amer: 94 mL/min/{1.73_m2} (ref >59)
GFR calc non Af Amer: 82 mL/min/{1.73_m2} (ref >59)
Globulin, Total: 2.9 g/dL (ref 1.5–4.5)
Glucose: 88 mg/dL (ref 65–99)
Potassium: 3.9 mmol/L (ref 3.5–5.2)
Sodium: 140 mmol/L (ref 134–144)
Total Protein: 7 g/dL (ref 6.0–8.5)

## 2020-01-07 LAB — HEMOGLOBIN A1C
Est. average glucose Bld gHb Est-mCnc: 137 mg/dL
Hgb A1c MFr Bld: 6.4 % — ABNORMAL HIGH (ref 4.8–5.6)

## 2020-01-07 LAB — LIPID PANEL
Chol/HDL Ratio: 2.8 ratio (ref 0.0–4.4)
Cholesterol, Total: 152 mg/dL (ref 100–199)
HDL: 54 mg/dL (ref >39)
LDL Chol Calc (NIH): 77 mg/dL (ref 0–99)
Triglycerides: 116 mg/dL (ref 0–149)
VLDL Cholesterol Cal: 21 mg/dL (ref 5–40)

## 2020-01-07 NOTE — Progress Notes (Signed)
Lab letter sent. Pt did not answer will call again

## 2020-02-01 ENCOUNTER — Other Ambulatory Visit: Payer: Self-pay

## 2020-02-01 ENCOUNTER — Ambulatory Visit (AMBULATORY_SURGERY_CENTER): Payer: Self-pay | Admitting: *Deleted

## 2020-02-01 VITALS — Temp 97.3°F | Ht 64.0 in | Wt 184.0 lb

## 2020-02-01 DIAGNOSIS — Z1211 Encounter for screening for malignant neoplasm of colon: Secondary | ICD-10-CM

## 2020-02-01 DIAGNOSIS — Z01818 Encounter for other preprocedural examination: Secondary | ICD-10-CM

## 2020-02-01 MED ORDER — PEG 3350-KCL-NA BICARB-NACL 420 G PO SOLR: 4000.0000 mL | Freq: Once | ORAL | 0 refills | Status: AC

## 2020-02-01 NOTE — Progress Notes (Signed)

## 2020-02-11 ENCOUNTER — Encounter: Payer: Self-pay | Admitting: Gastroenterology

## 2020-02-11 ENCOUNTER — Ambulatory Visit (INDEPENDENT_AMBULATORY_CARE_PROVIDER_SITE_OTHER): Payer: 59

## 2020-02-11 ENCOUNTER — Other Ambulatory Visit: Payer: Self-pay | Admitting: Gastroenterology

## 2020-02-11 DIAGNOSIS — Z1159 Encounter for screening for other viral diseases: Secondary | ICD-10-CM

## 2020-02-12 LAB — SARS CORONAVIRUS 2 (TAT 6-24 HRS): SARS Coronavirus 2: NEGATIVE

## 2020-02-15 ENCOUNTER — Ambulatory Visit (AMBULATORY_SURGERY_CENTER): Payer: PRIVATE HEALTH INSURANCE | Admitting: Gastroenterology

## 2020-02-15 ENCOUNTER — Encounter: Payer: Self-pay | Admitting: Gastroenterology

## 2020-02-15 ENCOUNTER — Other Ambulatory Visit: Payer: Self-pay

## 2020-02-15 VITALS — BP 112/58 | HR 69 | Temp 97.1°F | Resp 14 | Ht 64.0 in | Wt 184.0 lb

## 2020-02-15 DIAGNOSIS — D128 Benign neoplasm of rectum: Secondary | ICD-10-CM

## 2020-02-15 DIAGNOSIS — Z1211 Encounter for screening for malignant neoplasm of colon: Secondary | ICD-10-CM

## 2020-02-15 DIAGNOSIS — K621 Rectal polyp: Secondary | ICD-10-CM

## 2020-02-15 DIAGNOSIS — K5700 Diverticulitis of small intestine with perforation and abscess without bleeding: Secondary | ICD-10-CM

## 2020-02-15 MED ORDER — SODIUM CHLORIDE 0.9 % IV SOLN: 500.0000 mL | Freq: Once | INTRAVENOUS | Status: DC

## 2020-02-15 NOTE — Progress Notes (Signed)
Temp check by:LC Vital check by:CW  The patient states no changes in medical or surgical history since pre-visit screening on 02/01/20.

## 2020-02-15 NOTE — Progress Notes (Signed)
PT taken to PACU. Monitors in place. VSS. Report given to RN. 

## 2020-02-15 NOTE — Patient Instructions (Signed)
Handouts given:  Diverticulosis, Polyps Resume previous diet  Continue current medications Await pathology results  YOU HAD AN ENDOSCOPIC PROCEDURE TODAY AT THE [facility]:   Refer to the procedure report that was given to you for any specific questions about what was found during the examination.  If the procedure report does not answer your questions, please call your gastroenterologist to clarify.  If you requested that your care partner not be given the details of your procedure findings, then the procedure report has been included in a sealed envelope for you to review at your convenience later.  YOU SHOULD EXPECT: Some feelings of bloating in the abdomen. Passage of more gas than usual.  Walking can help get rid of the air that was put into your GI tract during the procedure and reduce the bloating. If you had a lower endoscopy (such as a colonoscopy or flexible sigmoidoscopy) you may notice spotting of blood in your stool or on the toilet paper. If you underwent a bowel prep for your procedure, you may not have a normal bowel movement for a few days.  Please Note:  You might notice some irritation and congestion in your nose or some drainage.  This is from the oxygen used during your procedure.  There is no need for concern and it should clear up in a day or so.  SYMPTOMS TO REPORT IMMEDIATELY:   Following lower endoscopy (colonoscopy or flexible sigmoidoscopy):  Excessive amounts of blood in the stool  Significant tenderness or worsening of abdominal pains  Swelling of the abdomen that is new, acute  Fever of 100F or higher  For urgent or emergent issues, a gastroenterologist can be reached at any hour by calling [phone]. Do not use MyChart messaging for urgent concerns.   DIET:  We do recommend a small meal at first, but then you may proceed to your regular diet.  Drink plenty of fluids but you should avoid alcoholic beverages for 24 hours.  ACTIVITY:  You  should plan to take it easy for the rest of today and you should NOT DRIVE or use heavy machinery until tomorrow (because of the sedation medicines used during the test).    FOLLOW UP: Our staff will call the number listed on your records 48-72 hours following your procedure to check on you and address any questions or concerns that you may have regarding the information given to you following your procedure. If we do not reach you, we will leave a message.  We will attempt to reach you two times.  During this call, we will ask if you have developed any symptoms of COVID 19. If you develop any symptoms (ie: fever, flu-like symptoms, shortness of breath, cough etc.) before then, please call [phone].  If you test positive for Covid 19 in the 2 weeks post procedure, please call and report this information to us.    If any biopsies were taken you will be contacted by phone or by letter within the next 1-3 weeks.  Please call us at [phone] if you have not heard about the biopsies in 3 weeks.   SIGNATURES/CONFIDENTIALITY: You and/or your care partner have signed paperwork which will be entered into your electronic medical record.  These signatures attest to the fact that that the information above on your After Visit Summary has been reviewed and is understood.  Full responsibility of the confidentiality of this discharge information lies with you and/or your care-partner. 

## 2020-02-15 NOTE — Progress Notes (Signed)
Called to room to assist during endoscopic procedure.  Patient ID and intended procedure confirmed with present staff. Received instructions for my participation in the procedure from the performing physician.  

## 2020-02-15 NOTE — Op Note (Signed)
[place] Patient Name: April Saunders Procedure Date: 02/15/2020 9:23 AM MRN: [id number] Endoscopist: Milus Banister , MD Age: 64 Referring MD:  Date of Birth: Feb 04, 1956 Gender: Female Account #: [id number] Procedure:                Colonoscopy Indications:              Screening for colorectal malignant neoplasm Medicines:                Monitored Anesthesia Care Procedure:                Pre-Anesthesia Assessment:                           - Prior to the procedure, a History and Physical                            was performed, and patient medications and                            allergies were reviewed. The patient's tolerance of                            previous anesthesia was also reviewed. The risks                            and benefits of the procedure and the sedation                            options and risks were discussed with the patient.                            All questions were answered, and informed consent                            was obtained. Prior Anticoagulants: The patient has                            taken no previous anticoagulant or antiplatelet                            agents. ASA Grade Assessment: II - A patient with                            mild systemic disease. After reviewing the risks                            and benefits, the patient was deemed in                            satisfactory condition to undergo the procedure.                           After obtaining informed consent, the colonoscope  was passed under direct vision. Throughout the                            procedure, the patient's blood pressure, pulse, and                            oxygen saturations were monitored continuously. The                            Colonoscope was introduced through the anus and                            advanced to the the cecum, identified by                            appendiceal orifice and  ileocecal valve. The                            colonoscopy was performed without difficulty. The                            patient tolerated the procedure well. The quality                            of the bowel preparation was good. Scope In: 9:38:57 AM Scope Out: 9:48:03 AM Scope Withdrawal Time: 0 hours 6 minutes 29 seconds  Total Procedure Duration: 0 hours 9 minutes 6 seconds  Findings:                 A 2 mm polyp was found in the rectum. The polyp was                            sessile. The polyp was removed with a cold snare.                            Resection and retrieval were complete.                           Multiple small-mouthed diverticula were found in                            the left colon.                           The exam was otherwise without abnormality on                            direct and retroflexion views. Complications:            No immediate complications. Estimated blood loss:                            None. Estimated Blood Loss:     Estimated blood loss: none. Impression:               -  One 2 mm polyp in the rectum, removed with a cold                            snare. Resected and retrieved.                           - Diverticulosis in the left colon.                           - The examination was otherwise normal on direct                            and retroflexion views. Recommendation:           - Patient has a contact number available for                            emergencies. The signs and symptoms of potential                            delayed complications were discussed with the                            patient. Return to normal activities tomorrow.                            Written discharge instructions were provided to the                            patient.                           - Resume previous diet.                           - Continue present medications.                           - Await pathology  results. Milus Banister, MD 02/15/2020 9:51:19 AM This report has been signed electronically.

## 2020-02-17 ENCOUNTER — Telehealth: Payer: Self-pay | Admitting: *Deleted

## 2020-02-17 ENCOUNTER — Telehealth: Payer: Self-pay

## 2020-02-17 NOTE — Telephone Encounter (Signed)
Attempted f/u phone call. No answer. Left message. °

## 2020-02-17 NOTE — Telephone Encounter (Signed)
No answer on 2nd follow up call.

## 2020-02-18 ENCOUNTER — Encounter: Payer: Self-pay | Admitting: Gastroenterology

## 2020-03-21 ENCOUNTER — Other Ambulatory Visit: Payer: Self-pay | Admitting: Emergency Medicine

## 2020-03-21 DIAGNOSIS — E785 Hyperlipidemia, unspecified: Secondary | ICD-10-CM

## 2020-03-21 DIAGNOSIS — I10 Essential (primary) hypertension: Secondary | ICD-10-CM

## 2020-07-06 ENCOUNTER — Encounter: Payer: Self-pay | Admitting: Emergency Medicine

## 2020-07-06 ENCOUNTER — Ambulatory Visit: Payer: 59 | Admitting: Emergency Medicine

## 2020-07-06 ENCOUNTER — Other Ambulatory Visit: Payer: Self-pay

## 2020-07-06 VITALS — BP 140/80 | HR 65 | Temp 97.6°F | Ht 64.0 in | Wt 179.0 lb

## 2020-07-06 DIAGNOSIS — E785 Hyperlipidemia, unspecified: Secondary | ICD-10-CM

## 2020-07-06 DIAGNOSIS — K5790 Diverticulosis of intestine, part unspecified, without perforation or abscess without bleeding: Secondary | ICD-10-CM

## 2020-07-06 DIAGNOSIS — I10 Essential (primary) hypertension: Secondary | ICD-10-CM

## 2020-07-06 DIAGNOSIS — Z6830 Body mass index (BMI) 30.0-30.9, adult: Secondary | ICD-10-CM | POA: Diagnosis not present

## 2020-07-06 DIAGNOSIS — R7303 Prediabetes: Secondary | ICD-10-CM | POA: Insufficient documentation

## 2020-07-06 NOTE — Assessment & Plan Note (Signed)
Well-controlled hypertension with normal blood pressure readings at home.  Continue present medication.  No changes.

## 2020-07-06 NOTE — Assessment & Plan Note (Signed)
Recommended high-fiber diet and or fiber supplementation along with better hydration to avoid constipation.

## 2020-07-06 NOTE — Assessment & Plan Note (Signed)
Continue statin therapy.  Diet and nutrition discussed.

## 2020-07-06 NOTE — Assessment & Plan Note (Signed)
Diet and nutrition discussed with patient.

## 2020-07-06 NOTE — Progress Notes (Signed)
April Saunders 64 y.o.   Chief Complaint  Patient presents with  . Hypertension    4 m f/u     HISTORY OF PRESENT ILLNESS: This is a 64 y.o. female with history of hypertension here for follow-up. Presently taking amlodipine 5 mg daily on simvastatin 20 mg daily. Doing well.  Has no complaints or medical concerns. Health maintenance items discussed with patient including mammogram and colonoscopy. Colonoscopy done last April showed 1 polyp and diverticulosis.  Report reviewed. Blood work done last February showed prediabetes.  Otherwise unremarkable labs with acceptable values. Fully vaccinated against Covid. BP Readings from Last 3 Encounters:  07/06/20 140/80  02/15/20 (!) 112/58  01/06/20 (!) 149/81    HPI   Prior to Admission medications   Medication Sig Start Date End Date Taking? Authorizing Provider  Acetaminophen (TYLENOL PO) Take by mouth as needed.   Yes [provider]  amLODipine (NORVASC) 5 MG tablet TAKE 1 TABLET BY MOUTH DAILY 03/21/20  Yes Amarilis Belflower, Ines Bloomer, MD  Cholecalciferol (VITAMIN D-3) 125 MCG (5000 UT) TABS Take 1 tablet by mouth daily. 04/08/19  Yes Hilts, Legrand Como, MD  [facility] Liver Oil CAPS Take 1 capsule by mouth daily.   Yes [provider]  fish oil-omega-3 fatty acids 1000 MG capsule Take 1 g by mouth daily.   Yes [provider]  simvastatin (ZOCOR) 20 MG tablet TAKE 1 TABLET BY MOUTH DAILY 03/21/20  Yes Laurena Valko, Ines Bloomer, MD    Allergies  Allergen Reactions  . Atorvastatin Calcium Itching  . Lipitor [Atorvastatin]     Weakness   . Lisinopril Other (See Comments)    Shoulder pain    Patient Active Problem List   Diagnosis Date Noted  . Dyslipidemia 12/30/2018  . Hypertension   . Obesity     Past Medical History:  Diagnosis Date  . Abnormal MRI    brain  . DJD (degenerative joint disease)    multiple joints  . Hyperlipidemia   . Hypertension   . Lipoma    rt ankle  . Obesity     Past Surgical  History:  Procedure Laterality Date  . ABDOMINAL HYSTERECTOMY     ? ovaries  . COLONOSCOPY      Social History   Socioeconomic History  . Marital status: Married    Spouse name: Suzann Lazaro  . Number of children: 3  . Years of education: 11th grade  . Highest education level: Not on file  Occupational History  . 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 Smoking status: Former Research scientist (life sciences)  . Smokeless tobacco: Never Used  . Tobacco comment: 2008  Vaping Use  . Vaping Use: Never used  Substance and Sexual Activity  . Alcohol use: No    Alcohol/week: 0.0 standard drinks  . Drug use: No  . Sexual activity: Yes    Partners: Male    Birth control/protection: Surgical  Other Topics Concern  . Not on file  Social History Narrative   Lives with her husband. Three adult child live independently and nearby.   Social Determinants of Health   Financial Resource Strain:   . Difficulty of Paying Living Expenses: Not on file  Food Insecurity:   . Worried About Charity fundraiser in the Last Year: Not on file  . Ran Out of Food in the Last Year: Not on file  Transportation Needs:   . Lack of Transportation (Medical): Not on file  .  Lack of Transportation (Non-Medical): Not on file  Physical Activity:   . Days of Exercise per Week: Not on file  . Minutes of Exercise per Session: Not on file  Stress:   . Feeling of Stress : Not on file  Social Connections:   . Frequency of Communication with Friends and Family: Not on file  . Frequency of Social Gatherings with Friends and Family: Not on file  . Attends Religious Services: Not on file  . Active Member of Clubs or Organizations: Not on file  . Attends Archivist Meetings: Not on file  . Marital Status: Not on file  Intimate Partner Violence:   . Fear of Current or Ex-Partner: Not on file  . Emotionally Abused: Not on file  . Physically Abused: Not on file  . Sexually Abused: Not on file     Family History  Problem Relation Age of Onset  . Diabetes Mellitus I Mother   . Hypertension Brother   . Diabetes Mellitus I Brother   . Esophageal cancer Brother   . Colon cancer Neg Hx   . Stomach cancer Neg Hx   . Rectal cancer Neg Hx      Review of Systems  Constitutional: Negative.  Negative for chills and fever.  HENT: Negative.  Negative for congestion and sore throat.   Respiratory: Negative.  Negative for cough and shortness of breath.   Cardiovascular: Negative.  Negative for chest pain and palpitations.  Gastrointestinal: Positive for blood in stool and constipation. Negative for abdominal pain, diarrhea, nausea and vomiting.  Genitourinary: Negative.  Negative for dysuria and hematuria.  Musculoskeletal: Negative.  Negative for back pain and myalgias.  Skin: Negative.  Negative for rash.  Neurological: Negative.  Negative for dizziness and headaches.  All other systems reviewed and are negative.  Today's Vitals   07/06/20 0923  BP: 140/80  Pulse: 65  Temp: 97.6 F (36.4 C)  TempSrc: Temporal  SpO2: 100%  Weight: 179 lb (81.2 kg)  Height: 5\' 4"  (1.626 m)   Body mass index is 30.73 kg/m.   Physical Exam Vitals reviewed.  Constitutional:      Appearance: Normal appearance.  HENT:     Head: Normocephalic.  Eyes:     Extraocular Movements: Extraocular movements intact.     Pupils: Pupils are equal, round, and reactive to light.  Cardiovascular:     Rate and Rhythm: Normal rate and regular rhythm.     Pulses: Normal pulses.     Heart sounds: Normal heart sounds.  Pulmonary:     Effort: Pulmonary effort is normal.     Breath sounds: Normal breath sounds.  Musculoskeletal:        General: Normal range of motion.     Cervical back: Normal range of motion and neck supple.  Skin:    General: Skin is warm and dry.     Capillary Refill: Capillary refill takes less than 2 seconds.  Neurological:     General: No focal deficit present.     Mental  Status: She is alert and oriented to person, place, and time.  Psychiatric:        Mood and Affect: Mood normal.        Behavior: Behavior normal.      ASSESSMENT & PLAN: Hypertension Well-controlled hypertension with normal blood pressure readings at home.  Continue present medication.  No changes.  Prediabetes Diet and nutrition discussed with patient.  Dyslipidemia Continue statin therapy.  Diet and nutrition discussed.  Diverticulosis Recommended high-fiber diet and or fiber supplementation along with better hydration to avoid constipation.  Dorene was seen today for hypertension.  Diagnoses and all orders for this visit:  Essential hypertension  Dyslipidemia  Prediabetes  Body mass index (BMI) of 30.0-30.9 in adult  Diverticulosis    Patient Instructions       If you have lab work done today you will be contacted with your lab results within the next 2 weeks.  If you have not heard from [place] then please contact us. The fastest way to get your results is to register for My Chart.   IF you received an x-ray today, you will receive an invoice from [facility]. Please contact [facility] at [phone] with questions or concerns regarding your invoice.   IF you received labwork today, you will receive an invoice from [facility]. Please contact [facility] at [phone] with questions or concerns regarding your invoice.   Our billing staff will not be able to assist you with questions regarding bills from these companies.  You will be contacted with the lab results as soon as they are available. The fastest way to get your results is to activate your My Chart account. Instructions are located on the last page of this paperwork. If you have not heard from [place] regarding the results in 2 weeks, please contact this office.      Hypertension, Adult High blood pressure (hypertension) is when the force of blood pumping through the arteries is too  strong. The arteries are the blood vessels that carry blood from the heart throughout the body. Hypertension forces the heart to work harder to pump blood and may cause arteries to become narrow or stiff. Untreated or uncontrolled hypertension can cause a heart attack, heart failure, a stroke, kidney disease, and other problems. A blood pressure reading consists of a higher number over a lower number. Ideally, your blood pressure should be below 120/80. The first ("top") number is called the systolic pressure. It is a measure of the pressure in your arteries as your heart beats. The second ("bottom") number is called the diastolic pressure. It is a measure of the pressure in your arteries as the heart relaxes. What are the causes? The exact cause of this condition is not known. There are some conditions that result in or are related to high blood pressure. What increases the risk? Some risk factors for high blood pressure are under your control. The following factors may make you more likely to develop this condition:  Smoking.  Having type 2 diabetes mellitus, high cholesterol, or both.  Not getting enough exercise or physical activity.  Being overweight.  Having too much fat, sugar, calories, or salt (sodium) in your diet.  Drinking too much alcohol. Some risk factors for high blood pressure may be difficult or impossible to change. Some of these factors include:  Having chronic kidney disease.  Having a family history of high blood pressure.  Age. Risk increases with age.  Race. You may be at higher risk if you are African American.  Gender. Men are at higher risk than women before age 24. After age 58, women are at higher risk than men.  Having obstructive sleep apnea.  Stress. What are the signs or symptoms? High blood pressure may not cause symptoms. Very high blood pressure (hypertensive crisis) may cause:  Headache.  Anxiety.  Shortness of  breath.  Nosebleed.  Nausea and vomiting.  Vision changes.  Severe chest pain.  Seizures. How  is this diagnosed? This condition is diagnosed by measuring your blood pressure while you are seated, with your arm resting on a flat surface, your legs uncrossed, and your feet flat on the floor. The cuff of the blood pressure monitor will be placed directly against the skin of your upper arm at the level of your heart. It should be measured at least twice using the same arm. Certain conditions can cause a difference in blood pressure between your right and left arms. Certain factors can cause blood pressure readings to be lower or higher than normal for a short period of time:  When your blood pressure is higher when you are in a health care provider's office than when you are at home, this is called white coat hypertension. Most people with this condition do not need medicines.  When your blood pressure is higher at home than when you are in a health care provider's office, this is called masked hypertension. Most people with this condition may need medicines to control blood pressure. If you have a high blood pressure reading during one visit or you have normal blood pressure with other risk factors, you may be asked to:  Return on a different day to have your blood pressure checked again.  Monitor your blood pressure at home for 1 week or longer. If you are diagnosed with hypertension, you may have other blood or imaging tests to help your health care provider understand your overall risk for other conditions. How is this treated? This condition is treated by making healthy lifestyle changes, such as eating healthy foods, exercising more, and reducing your alcohol intake. Your health care provider may prescribe medicine if lifestyle changes are not enough to get your blood pressure under control, and if:  Your systolic blood pressure is above 130.  Your diastolic blood pressure is above  80. Your personal target blood pressure may vary depending on your medical conditions, your age, and other factors. Follow these instructions at home: Eating and drinking   Eat a diet that is high in fiber and potassium, and low in sodium, added sugar, and fat. An example eating plan is called the DASH (Dietary Approaches to Stop Hypertension) diet. To eat this way: ? Eat plenty of fresh fruits and vegetables. Try to fill one half of your plate at each meal with fruits and vegetables. ? Eat whole grains, such as whole-wheat pasta, brown rice, or whole-grain bread. Fill about one fourth of your plate with whole grains. ? Eat or drink low-fat dairy products, such as skim milk or low-fat yogurt. ? Avoid fatty cuts of meat, processed or cured meats, and poultry with skin. Fill about one fourth of your plate with lean proteins, such as fish, chicken without skin, beans, eggs, or tofu. ? Avoid pre-made and processed foods. These tend to be higher in sodium, added sugar, and fat.  Reduce your daily sodium intake. Most people with hypertension should eat less than 1,500 mg of sodium a day.  Do not drink alcohol if: ? Your health care provider tells you not to drink. ? You are pregnant, may be pregnant, or are planning to become pregnant.  If you drink alcohol: ? Limit how much you use to:  0-1 drink a day for women.  0-2 drinks a day for men. ? Be aware of how much alcohol is in your drink. In the [place] one drink equals one 12 oz bottle of beer (355 mL), one 5 oz glass of wine (148  mL), or one 1 oz glass of hard liquor (44 mL). Lifestyle   Work with your health care provider to maintain a healthy body weight or to lose weight. Ask what an ideal weight is for you.  Get at least 30 minutes of exercise most days of the week. Activities may include walking, swimming, or biking.  Include exercise to strengthen your muscles (resistance exercise), such as Pilates or lifting weights, as part of  your weekly exercise routine. Try to do these types of exercises for 30 minutes at least 3 days a week.  Do not use any products that contain nicotine or tobacco, such as cigarettes, e-cigarettes, and chewing tobacco. If you need help quitting, ask your health care provider.  Monitor your blood pressure at home as told by your health care provider.  Keep all follow-up visits as told by your health care provider. This is important. Medicines  Take over-the-counter and prescription medicines only as told by your health care provider. Follow directions carefully. Blood pressure medicines must be taken as prescribed.  Do not skip doses of blood pressure medicine. Doing this puts you at risk for problems and can make the medicine less effective.  Ask your health care provider about side effects or reactions to medicines that you should watch for. Contact a health care provider if you:  Think you are having a reaction to a medicine you are taking.  Have headaches that keep coming back (recurring).  Feel dizzy.  Have swelling in your ankles.  Have trouble with your vision. Get help right away if you:  Develop a severe headache or confusion.  Have unusual weakness or numbness.  Feel faint.  Have severe pain in your chest or abdomen.  Vomit repeatedly.  Have trouble breathing. Summary  Hypertension is when the force of blood pumping through your arteries is too strong. If this condition is not controlled, it may put you at risk for serious complications.  Your personal target blood pressure may vary depending on your medical conditions, your age, and other factors. For most people, a normal blood pressure is less than 120/80.  Hypertension is treated with lifestyle changes, medicines, or a combination of both. Lifestyle changes include losing weight, eating a healthy, low-sodium diet, exercising more, and limiting alcohol. This information is not intended to replace advice given  to you by your health care provider. Make sure you discuss any questions you have with your health care provider. Document Revised: 07/09/2018 Document Reviewed: 07/09/2018 Elsevier Patient Education  2020 [employer]      Agustina Caroli, MD [facility]

## 2020-07-06 NOTE — Patient Instructions (Addendum)
   If you have lab work done today you will be contacted with your lab results within the next 2 weeks.  If you have not heard from us then please contact us. The fastest way to get your results is to register for My Chart.   IF you received an x-ray today, you will receive an invoice from [facility]. Please contact [facility] at [phone] with questions or concerns regarding your invoice.   IF you received labwork today, you will receive an invoice from [facility]. Please contact [facility] at [phone] with questions or concerns regarding your invoice.   Our billing staff will not be able to assist you with questions regarding bills from these companies.  You will be contacted with the lab results as soon as they are available. The fastest way to get your results is to activate your My Chart account. Instructions are located on the last page of this paperwork. If you have not heard from us regarding the results in 2 weeks, please contact this office.      Hypertension, Adult High blood pressure (hypertension) is when the force of blood pumping through the arteries is too strong. The arteries are the blood vessels that carry blood from the heart throughout the body. Hypertension forces the heart to work harder to pump blood and may cause arteries to become narrow or stiff. Untreated or uncontrolled hypertension can cause a heart attack, heart failure, a stroke, kidney disease, and other problems. A blood pressure reading consists of a higher number over a lower number. Ideally, your blood pressure should be below 120/80. The first ("top") number is called the systolic pressure. It is a measure of the pressure in your arteries as your heart beats. The second ("bottom") number is called the diastolic pressure. It is a measure of the pressure in your arteries as the heart relaxes. What are the causes? The exact cause of this condition is not known. There are some conditions  that result in or are related to high blood pressure. What increases the risk? Some risk factors for high blood pressure are under your control. The following factors may make you more likely to develop this condition:  Smoking.  Having type 2 diabetes mellitus, high cholesterol, or both.  Not getting enough exercise or physical activity.  Being overweight.  Having too much fat, sugar, calories, or salt (sodium) in your diet.  Drinking too much alcohol. Some risk factors for high blood pressure may be difficult or impossible to change. Some of these factors include:  Having chronic kidney disease.  Having a family history of high blood pressure.  Age. Risk increases with age.  Race. You may be at higher risk if you are African American.  Gender. Men are at higher risk than women before age 45. After age 65, women are at higher risk than men.  Having obstructive sleep apnea.  Stress. What are the signs or symptoms? High blood pressure may not cause symptoms. Very high blood pressure (hypertensive crisis) may cause:  Headache.  Anxiety.  Shortness of breath.  Nosebleed.  Nausea and vomiting.  Vision changes.  Severe chest pain.  Seizures. How is this diagnosed? This condition is diagnosed by measuring your blood pressure while you are seated, with your arm resting on a flat surface, your legs uncrossed, and your feet flat on the floor. The cuff of the blood pressure monitor will be placed directly against the skin of your upper arm at the level of your   heart. It should be measured at least twice using the same arm. Certain conditions can cause a difference in blood pressure between your right and left arms. Certain factors can cause blood pressure readings to be lower or higher than normal for a short period of time:  When your blood pressure is higher when you are in a health care provider's office than when you are at home, this is called white coat hypertension.  Most people with this condition do not need medicines.  When your blood pressure is higher at home than when you are in a health care provider's office, this is called masked hypertension. Most people with this condition may need medicines to control blood pressure. If you have a high blood pressure reading during one visit or you have normal blood pressure with other risk factors, you may be asked to:  Return on a different day to have your blood pressure checked again.  Monitor your blood pressure at home for 1 week or longer. If you are diagnosed with hypertension, you may have other blood or imaging tests to help your health care provider understand your overall risk for other conditions. How is this treated? This condition is treated by making healthy lifestyle changes, such as eating healthy foods, exercising more, and reducing your alcohol intake. Your health care provider may prescribe medicine if lifestyle changes are not enough to get your blood pressure under control, and if:  Your systolic blood pressure is above 130.  Your diastolic blood pressure is above 80. Your personal target blood pressure may vary depending on your medical conditions, your age, and other factors. Follow these instructions at home: Eating and drinking   Eat a diet that is high in fiber and potassium, and low in sodium, added sugar, and fat. An example eating plan is called the DASH (Dietary Approaches to Stop Hypertension) diet. To eat this way: ? Eat plenty of fresh fruits and vegetables. Try to fill one half of your plate at each meal with fruits and vegetables. ? Eat whole grains, such as whole-wheat pasta, brown rice, or whole-grain bread. Fill about one fourth of your plate with whole grains. ? Eat or drink low-fat dairy products, such as skim milk or low-fat yogurt. ? Avoid fatty cuts of meat, processed or cured meats, and poultry with skin. Fill about one fourth of your plate with lean proteins, such  as fish, chicken without skin, beans, eggs, or tofu. ? Avoid pre-made and processed foods. These tend to be higher in sodium, added sugar, and fat.  Reduce your daily sodium intake. Most people with hypertension should eat less than 1,500 mg of sodium a day.  Do not drink alcohol if: ? Your health care provider tells you not to drink. ? You are pregnant, may be pregnant, or are planning to become pregnant.  If you drink alcohol: ? Limit how much you use to:  0-1 drink a day for women.  0-2 drinks a day for men. ? Be aware of how much alcohol is in your drink. In the [place] one drink equals one 12 oz bottle of beer (355 mL), one 5 oz glass of wine (148 mL), or one 1 oz glass of hard liquor (44 mL). Lifestyle   Work with your health care provider to maintain a healthy body weight or to lose weight. Ask what an ideal weight is for you.  Get at least 30 minutes of exercise most days of the week. Activities may include walking, swimming,   or biking.  Include exercise to strengthen your muscles (resistance exercise), such as Pilates or lifting weights, as part of your weekly exercise routine. Try to do these types of exercises for 30 minutes at least 3 days a week.  Do not use any products that contain nicotine or tobacco, such as cigarettes, e-cigarettes, and chewing tobacco. If you need help quitting, ask your health care provider.  Monitor your blood pressure at home as told by your health care provider.  Keep all follow-up visits as told by your health care provider. This is important. Medicines  Take over-the-counter and prescription medicines only as told by your health care provider. Follow directions carefully. Blood pressure medicines must be taken as prescribed.  Do not skip doses of blood pressure medicine. Doing this puts you at risk for problems and can make the medicine less effective.  Ask your health care provider about side effects or reactions to medicines that you  should watch for. Contact a health care provider if you:  Think you are having a reaction to a medicine you are taking.  Have headaches that keep coming back (recurring).  Feel dizzy.  Have swelling in your ankles.  Have trouble with your vision. Get help right away if you:  Develop a severe headache or confusion.  Have unusual weakness or numbness.  Feel faint.  Have severe pain in your chest or abdomen.  Vomit repeatedly.  Have trouble breathing. Summary  Hypertension is when the force of blood pumping through your arteries is too strong. If this condition is not controlled, it may put you at risk for serious complications.  Your personal target blood pressure may vary depending on your medical conditions, your age, and other factors. For most people, a normal blood pressure is less than 120/80.  Hypertension is treated with lifestyle changes, medicines, or a combination of both. Lifestyle changes include losing weight, eating a healthy, low-sodium diet, exercising more, and limiting alcohol. This information is not intended to replace advice given to you by your health care provider. Make sure you discuss any questions you have with your health care provider. Document Revised: 07/09/2018 Document Reviewed: 07/09/2018 Elsevier Patient Education  2020 Elsevier Inc.  

## 2020-12-13 LAB — HM MAMMOGRAPHY

## 2020-12-15 ENCOUNTER — Encounter: Payer: Self-pay | Admitting: *Deleted

## 2021-01-05 ENCOUNTER — Encounter: Payer: Self-pay | Admitting: Emergency Medicine

## 2021-01-05 ENCOUNTER — Other Ambulatory Visit: Payer: Self-pay

## 2021-01-05 ENCOUNTER — Ambulatory Visit: Payer: 59 | Admitting: Emergency Medicine

## 2021-01-05 VITALS — BP 131/71 | HR 64 | Temp 97.4°F | Resp 16 | Ht 63.0 in | Wt 182.0 lb

## 2021-01-05 DIAGNOSIS — Z23 Encounter for immunization: Secondary | ICD-10-CM | POA: Diagnosis not present

## 2021-01-05 DIAGNOSIS — E785 Hyperlipidemia, unspecified: Secondary | ICD-10-CM

## 2021-01-05 DIAGNOSIS — I10 Essential (primary) hypertension: Secondary | ICD-10-CM | POA: Diagnosis not present

## 2021-01-05 DIAGNOSIS — K5790 Diverticulosis of intestine, part unspecified, without perforation or abscess without bleeding: Secondary | ICD-10-CM | POA: Diagnosis not present

## 2021-01-05 DIAGNOSIS — R7303 Prediabetes: Secondary | ICD-10-CM | POA: Diagnosis not present

## 2021-01-05 LAB — COMPREHENSIVE METABOLIC PANEL
ALT: 17 IU/L (ref 0–32)
AST: 20 IU/L (ref 0–40)
Albumin/Globulin Ratio: 1.7 (ref 1.2–2.2)
Albumin: 4.4 g/dL (ref 3.8–4.8)
Alkaline Phosphatase: 94 IU/L (ref 44–121)
BUN/Creatinine Ratio: 15 (ref 12–28)
BUN: 12 mg/dL (ref 8–27)
Bilirubin Total: 0.5 mg/dL (ref 0.0–1.2)
CO2: 24 mmol/L (ref 20–29)
Calcium: 9.9 mg/dL (ref 8.7–10.3)
Chloride: 103 mmol/L (ref 96–106)
Creatinine, Ser: 0.82 mg/dL (ref 0.57–1.00)
GFR calc Af Amer: 88 mL/min/{1.73_m2} (ref >59)
GFR calc non Af Amer: 76 mL/min/{1.73_m2} (ref >59)
Globulin, Total: 2.6 g/dL (ref 1.5–4.5)
Glucose: 118 mg/dL — ABNORMAL HIGH (ref 65–99)
Potassium: 4.4 mmol/L (ref 3.5–5.2)
Sodium: 142 mmol/L (ref 134–144)
Total Protein: 7 g/dL (ref 6.0–8.5)

## 2021-01-05 LAB — LIPID PANEL
Chol/HDL Ratio: 2.6 ratio (ref 0.0–4.4)
Cholesterol, Total: 157 mg/dL (ref 100–199)
HDL: 60 mg/dL (ref >39)
LDL Chol Calc (NIH): 87 mg/dL (ref 0–99)
Triglycerides: 48 mg/dL (ref 0–149)
VLDL Cholesterol Cal: 10 mg/dL (ref 5–40)

## 2021-01-05 LAB — HEMOGLOBIN A1C
Est. average glucose Bld gHb Est-mCnc: 126 mg/dL
Hgb A1c MFr Bld: 6 % — ABNORMAL HIGH (ref 4.8–5.6)

## 2021-01-05 NOTE — Progress Notes (Signed)
April Saunders 65 y.o.   Chief Complaint  Patient presents with  . Hypertension    Follow up 6 month    HISTORY OF PRESENT ILLNESS: This is a 65 y.o. female with history of hypertension, dyslipidemia, and prediabetes here for follow-up. Doing well.  Has no complaints or medical concerns today. Hypertension: On amlodipine 5 mg daily Prediabetes: No medications Dyslipidemia: On simvastatin 20 mg daily. Up-to-date with colonoscopy and mammogram. Fully vaccinated against Covid with a booster.  HPI   Prior to Admission medications   Medication Sig Start Date End Date Taking? Authorizing Provider  Acetaminophen (TYLENOL PO) Take by mouth as needed.   Yes April Saunders  amLODipine (NORVASC) 5 MG tablet TAKE 1 TABLET BY MOUTH DAILY 03/21/20  Yes April Saunders, April Bloomer, Saunders  Cholecalciferol (VITAMIN D-3) 125 MCG (5000 UT) TABS Take 1 tablet by mouth daily. 04/08/19  Yes April Saunders, April Como, Saunders  [facility] Liver Oil CAPS Take 1 capsule by mouth daily.   Yes April Saunders  fish oil-omega-3 fatty acids 1000 MG capsule Take 1 g by mouth daily.   Yes April Saunders  simvastatin (ZOCOR) 20 MG tablet TAKE 1 TABLET BY MOUTH DAILY 03/21/20  Yes April Saunders, April Bloomer, Saunders    Allergies  Allergen Reactions  . Atorvastatin Calcium Itching  . Lipitor [Atorvastatin]     Weakness   . Lisinopril Other (See Comments)    Shoulder pain    Patient Active Problem List   Diagnosis Date Noted  . Prediabetes 07/06/2020  . Diverticulosis 07/06/2020  . Dyslipidemia 12/30/2018  . Hypertension   . Obesity     Past Medical History:  Diagnosis Date  . Abnormal MRI    brain  . DJD (degenerative joint disease)    multiple joints  . Hyperlipidemia   . Hypertension   . Lipoma    rt ankle  . Obesity     Past Surgical History:  Procedure Laterality Date  . ABDOMINAL HYSTERECTOMY     ? ovaries  . COLONOSCOPY      Social History   Socioeconomic History  . Marital status:  Married    Spouse name: April Saunders  . Number of children: 3  . Years of education: 11th grade  . Highest education level: Not on file  Occupational History  . 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 Smoking status: Former Research scientist (life sciences)  . Smokeless tobacco: Never Used  . Tobacco comment: 2008  Vaping Use  . Vaping Use: Never used  Substance and Sexual Activity  . Alcohol use: No    Alcohol/week: 0.0 standard drinks  . Drug use: No  . Sexual activity: Yes    Partners: Male    Birth control/protection: Surgical  Other Topics Concern  . Not on file  Social History Narrative   Lives with her husband. Three adult child live independently and nearby.   Social Determinants of Health   Financial Resource Strain: Not on file  Food Insecurity: Not on file  Transportation Needs: Not on file  Physical Activity: Not on file  Stress: Not on file  Social Connections: Not on file  Intimate Partner Violence: Not on file    Family History  Problem Relation Age of Onset  . Diabetes Mellitus I Mother   . Hypertension Brother   . Diabetes Mellitus I Brother   . Esophageal cancer Brother   . Colon cancer Neg Hx   . Stomach cancer Neg Hx   .  Rectal cancer Neg Hx      Review of Systems  Constitutional: Negative.  Negative for chills and fever.  HENT: Negative.  Negative for congestion and sore throat.   Respiratory: Negative.  Negative for cough and shortness of breath.   Cardiovascular: Negative.  Negative for chest pain and palpitations.  Gastrointestinal: Negative.  Negative for abdominal pain, diarrhea, nausea and vomiting.  Skin: Negative.  Negative for rash.  Neurological: Negative.  Negative for dizziness and headaches.  All other systems reviewed and are negative.    Today's Vitals   01/05/21 0811  BP: 131/71  Pulse: 64  Resp: 16  Temp: (!) 97.4 F (36.3 C)  TempSrc: Temporal  SpO2: 98%  Weight: 182 lb (82.6 kg)  Height: 5\' 3"  (1.6 m)    Body mass index is 32.24 kg/m.   Physical Exam Vitals reviewed.  Constitutional:      Appearance: Normal appearance.  HENT:     Head: Normocephalic.  Eyes:     Extraocular Movements: Extraocular movements intact.     Pupils: Pupils are equal, round, and reactive to light.  Cardiovascular:     Rate and Rhythm: Normal rate and regular rhythm.     Pulses: Normal pulses.     Heart sounds: Normal heart sounds.  Pulmonary:     Effort: Pulmonary effort is normal.     Breath sounds: Normal breath sounds.  Musculoskeletal:        General: Normal range of motion.     Cervical back: Normal range of motion and neck supple.  Skin:    General: Skin is warm and dry.     Capillary Refill: Capillary refill takes less than 2 seconds.  Neurological:     General: No focal deficit present.     Mental Status: She is alert and oriented to person, place, and time.  Psychiatric:        Mood and Affect: Mood normal.        Behavior: Behavior normal.      ASSESSMENT & PLAN: Clinically stable.  No medical concerns identified during this visit.  Continue present medications.  No changes. Diet and nutrition discussed. Follow-up in 6 months.  [name] was seen today for hypertension.  Diagnoses and all orders for this visit:  Essential hypertension -     Comprehensive metabolic panel  Dyslipidemia -     Lipid panel  Prediabetes -     Hemoglobin A1c  Diverticulosis  Need for shingles vaccine -     Varicella-zoster vaccine IM (Shingrix)    Patient Instructions       If you have lab work done today you will be contacted with your lab results within the next 2 weeks.  If you have not heard from [place] then please contact us. The fastest way to get your results is to register for My Chart.   IF you received an x-ray today, you will receive an invoice from [facility]. Please contact [facility] at [phone] with questions or concerns regarding your invoice.   IF  you received labwork today, you will receive an invoice from [facility]. Please contact [facility] at [phone] with questions or concerns regarding your invoice.   Our billing staff will not be able to assist you with questions regarding bills from these companies.  You will be contacted with the lab results as soon as they are available. The fastest way to get your results is to activate your My Chart account. Instructions are located on the  last page of this paperwork. If you have not heard from [place] regarding the results in 2 weeks, please contact this office.     Health Maintenance, Female Adopting a healthy lifestyle and getting preventive care are important in promoting health and wellness. Ask your health care provider about:  The right schedule for you to have regular tests and exams.  Things you can do on your own to prevent diseases and keep yourself healthy. What should I know about diet, weight, and exercise? Eat a healthy diet  Eat a diet that includes plenty of vegetables, fruits, low-fat dairy products, and lean protein.  Do not eat a lot of foods that are high in solid fats, added sugars, or sodium.   Maintain a healthy weight Body mass index (BMI) is used to identify weight problems. It estimates body fat based on height and weight. Your health care provider can help determine your BMI and help you achieve or maintain a healthy weight. Get regular exercise Get regular exercise. This is one of the most important things you can do for your health. Most adults should:  Exercise for at least 150 minutes each week. The exercise should increase your heart rate and make you sweat (moderate-intensity exercise).  Do strengthening exercises at least twice a week. This is in addition to the moderate-intensity exercise.  Spend less time sitting. Even light physical activity can be beneficial. Watch cholesterol and blood lipids Have your blood tested for lipids and cholesterol at 64  years of age, then have this test every 5 years. Have your cholesterol levels checked more often if:  Your lipid or cholesterol levels are high.  You are older than 64 years of age.  You are at high risk for heart disease. What should I know about cancer screening? Depending on your health history and family history, you may need to have cancer screening at various ages. This may include screening for:  Breast cancer.  Cervical cancer.  Colorectal cancer.  Skin cancer.  Lung cancer. What should I know about heart disease, diabetes, and high blood pressure? Blood pressure and heart disease  High blood pressure causes heart disease and increases the risk of stroke. This is more likely to develop in people who have high blood pressure readings, are of African descent, or are overweight.  Have your blood pressure checked: ? Every 3-5 years if you are 54-37 years of age. ? Every year if you are 69 years old or older. Diabetes Have regular diabetes screenings. This checks your fasting blood sugar level. Have the screening done:  Once every three years after age 24 if you are at a normal weight and have a low risk for diabetes.  More often and at a younger age if you are overweight or have a high risk for diabetes. What should I know about preventing infection? Hepatitis B If you have a higher risk for hepatitis B, you should be screened for this virus. Talk with your health care provider to find out if you are at risk for hepatitis B infection. Hepatitis C Testing is recommended for:  Everyone born from [birth year] through 1964.  Anyone with known risk factors for hepatitis C. Sexually transmitted infections (STIs)  Get screened for STIs, including gonorrhea and chlamydia, if: ? You are sexually active and are younger than 64 years of age. ? You are older than 64 years of age and your health care provider tells you that you are at risk for this type of infection. ?  Your sexual  activity has changed since you were last screened, and you are at increased risk for chlamydia or gonorrhea. Ask your health care provider if you are at risk.  Ask your health care provider about whether you are at high risk for HIV. Your health care provider may recommend a prescription medicine to help prevent HIV infection. If you choose to take medicine to prevent HIV, you should first get tested for HIV. You should then be tested every 3 months for as long as you are taking the medicine. Pregnancy  If you are about to stop having your period (premenopausal) and you may become pregnant, seek counseling before you get pregnant.  Take 400 to 800 micrograms (mcg) of folic acid every day if you become pregnant.  Ask for birth control (contraception) if you want to prevent pregnancy. Osteoporosis and menopause Osteoporosis is a disease in which the bones lose minerals and strength with aging. This can result in bone fractures. If you are 33 years old or older, or if you are at risk for osteoporosis and fractures, ask your health care provider if you should:  Be screened for bone loss.  Take a calcium or vitamin D supplement to lower your risk of fractures.  Be given hormone replacement therapy (HRT) to treat symptoms of menopause. Follow these instructions at home: Lifestyle  Do not use any products that contain nicotine or tobacco, such as cigarettes, e-cigarettes, and chewing tobacco. If you need help quitting, ask your health care provider.  Do not use street drugs.  Do not share needles.  Ask your health care provider for help if you need support or information about quitting drugs. Alcohol use  Do not drink alcohol if: ? Your health care provider tells you not to drink. ? You are pregnant, may be pregnant, or are planning to become pregnant.  If you drink alcohol: ? Limit how much you use to 0-1 drink a day. ? Limit intake if you are breastfeeding.  Be aware of how much  alcohol is in your drink. In the [place] one drink equals one 12 oz bottle of beer (355 mL), one 5 oz glass of wine (148 mL), or one 1 oz glass of hard liquor (44 mL). General instructions  Schedule regular health, dental, and eye exams.  Stay current with your vaccines.  Tell your health care provider if: ? You often feel depressed. ? You have ever been abused or do not feel safe at home. Summary  Adopting a healthy lifestyle and getting preventive care are important in promoting health and wellness.  Follow your health care provider's instructions about healthy diet, exercising, and getting tested or screened for diseases.  Follow your health care provider's instructions on monitoring your cholesterol and blood pressure. This information is not intended to replace advice given to you by your health care provider. Make sure you discuss any questions you have with your health care provider. Document Revised: 10/22/2018 Document Reviewed: 10/22/2018 Elsevier Patient Education  2021 Elsevier Inc.      Agustina Caroli, [facility]

## 2021-01-05 NOTE — Patient Instructions (Addendum)
   If you have lab work done today you will be contacted with your lab results within the next 2 weeks.  If you have not heard from us then please contact us. The fastest way to get your results is to register for My Chart.   IF you received an x-ray today, you will receive an invoice from [facility]. Please contact [facility] at [phone] with questions or concerns regarding your invoice.   IF you received labwork today, you will receive an invoice from [facility]. Please contact [facility] at [phone] with questions or concerns regarding your invoice.   Our billing staff will not be able to assist you with questions regarding bills from these companies.  You will be contacted with the lab results as soon as they are available. The fastest way to get your results is to activate your My Chart account. Instructions are located on the last page of this paperwork. If you have not heard from us regarding the results in 2 weeks, please contact this office.     Health Maintenance, Female Adopting a healthy lifestyle and getting preventive care are important in promoting health and wellness. Ask your health care provider about:  The right schedule for you to have regular tests and exams.  Things you can do on your own to prevent diseases and keep yourself healthy. What should I know about diet, weight, and exercise? Eat a healthy diet  Eat a diet that includes plenty of vegetables, fruits, low-fat dairy products, and lean protein.  Do not eat a lot of foods that are high in solid fats, added sugars, or sodium.   Maintain a healthy weight Body mass index (BMI) is used to identify weight problems. It estimates body fat based on height and weight. Your health care provider can help determine your BMI and help you achieve or maintain a healthy weight. Get regular exercise Get regular exercise. This is one of the most important things you can do for your health. Most  adults should:  Exercise for at least 150 minutes each week. The exercise should increase your heart rate and make you sweat (moderate-intensity exercise).  Do strengthening exercises at least twice a week. This is in addition to the moderate-intensity exercise.  Spend less time sitting. Even light physical activity can be beneficial. Watch cholesterol and blood lipids Have your blood tested for lipids and cholesterol at 64 years of age, then have this test every 5 years. Have your cholesterol levels checked more often if:  Your lipid or cholesterol levels are high.  You are older than 64 years of age.  You are at high risk for heart disease. What should I know about cancer screening? Depending on your health history and family history, you may need to have cancer screening at various ages. This may include screening for:  Breast cancer.  Cervical cancer.  Colorectal cancer.  Skin cancer.  Lung cancer. What should I know about heart disease, diabetes, and high blood pressure? Blood pressure and heart disease  High blood pressure causes heart disease and increases the risk of stroke. This is more likely to develop in people who have high blood pressure readings, are of African descent, or are overweight.  Have your blood pressure checked: ? Every 3-5 years if you are 18-39 years of age. ? Every year if you are 40 years old or older. Diabetes Have regular diabetes screenings. This checks your fasting blood sugar level. Have the screening done:  Once every   three years after age 40 if you are at a normal weight and have a low risk for diabetes.  More often and at a younger age if you are overweight or have a high risk for diabetes. What should I know about preventing infection? Hepatitis B If you have a higher risk for hepatitis B, you should be screened for this virus. Talk with your health care provider to find out if you are at risk for hepatitis B infection. Hepatitis  C Testing is recommended for:  Everyone born from 1945 through 1965.  Anyone with known risk factors for hepatitis C. Sexually transmitted infections (STIs)  Get screened for STIs, including gonorrhea and chlamydia, if: ? You are sexually active and are younger than 64 years of age. ? You are older than 64 years of age and your health care provider tells you that you are at risk for this type of infection. ? Your sexual activity has changed since you were last screened, and you are at increased risk for chlamydia or gonorrhea. Ask your health care provider if you are at risk.  Ask your health care provider about whether you are at high risk for HIV. Your health care provider may recommend a prescription medicine to help prevent HIV infection. If you choose to take medicine to prevent HIV, you should first get tested for HIV. You should then be tested every 3 months for as long as you are taking the medicine. Pregnancy  If you are about to stop having your period (premenopausal) and you may become pregnant, seek counseling before you get pregnant.  Take 400 to 800 micrograms (mcg) of folic acid every day if you become pregnant.  Ask for birth control (contraception) if you want to prevent pregnancy. Osteoporosis and menopause Osteoporosis is a disease in which the bones lose minerals and strength with aging. This can result in bone fractures. If you are 65 years old or older, or if you are at risk for osteoporosis and fractures, ask your health care provider if you should:  Be screened for bone loss.  Take a calcium or vitamin D supplement to lower your risk of fractures.  Be given hormone replacement therapy (HRT) to treat symptoms of menopause. Follow these instructions at home: Lifestyle  Do not use any products that contain nicotine or tobacco, such as cigarettes, e-cigarettes, and chewing tobacco. If you need help quitting, ask your health care provider.  Do not use street  drugs.  Do not share needles.  Ask your health care provider for help if you need support or information about quitting drugs. Alcohol use  Do not drink alcohol if: ? Your health care provider tells you not to drink. ? You are pregnant, may be pregnant, or are planning to become pregnant.  If you drink alcohol: ? Limit how much you use to 0-1 drink a day. ? Limit intake if you are breastfeeding.  Be aware of how much alcohol is in your drink. In the [place] one drink equals one 12 oz bottle of beer (355 mL), one 5 oz glass of wine (148 mL), or one 1 oz glass of hard liquor (44 mL). General instructions  Schedule regular health, dental, and eye exams.  Stay current with your vaccines.  Tell your health care provider if: ? You often feel depressed. ? You have ever been abused or do not feel safe at home. Summary  Adopting a healthy lifestyle and getting preventive care are important in promoting health and wellness.    Follow your health care provider's instructions about healthy diet, exercising, and getting tested or screened for diseases.  Follow your health care provider's instructions on monitoring your cholesterol and blood pressure. This information is not intended to replace advice given to you by your health care provider. Make sure you discuss any questions you have with your health care provider. Document Revised: 10/22/2018 Document Reviewed: 10/22/2018 Elsevier Patient Education  2021 Elsevier Inc.  

## 2021-04-15 ENCOUNTER — Other Ambulatory Visit: Payer: Self-pay | Admitting: Emergency Medicine

## 2021-04-15 DIAGNOSIS — I10 Essential (primary) hypertension: Secondary | ICD-10-CM

## 2021-04-15 DIAGNOSIS — E785 Hyperlipidemia, unspecified: Secondary | ICD-10-CM

## 2021-07-05 ENCOUNTER — Ambulatory Visit (INDEPENDENT_AMBULATORY_CARE_PROVIDER_SITE_OTHER): Payer: PRIVATE HEALTH INSURANCE | Admitting: Emergency Medicine

## 2021-07-05 ENCOUNTER — Encounter: Payer: Self-pay | Admitting: Emergency Medicine

## 2021-07-05 ENCOUNTER — Other Ambulatory Visit: Payer: Self-pay

## 2021-07-05 VITALS — BP 148/82 | HR 64 | Temp 98.6°F | Ht 63.0 in | Wt 173.0 lb

## 2021-07-05 DIAGNOSIS — E785 Hyperlipidemia, unspecified: Secondary | ICD-10-CM

## 2021-07-05 DIAGNOSIS — R7303 Prediabetes: Secondary | ICD-10-CM | POA: Diagnosis not present

## 2021-07-05 DIAGNOSIS — G500 Trigeminal neuralgia: Secondary | ICD-10-CM

## 2021-07-05 DIAGNOSIS — I10 Essential (primary) hypertension: Secondary | ICD-10-CM

## 2021-07-05 HISTORY — DX: Trigeminal neuralgia: G50.0

## 2021-07-05 MED ORDER — HYDROCHLOROTHIAZIDE 12.5 MG PO TABS: 12.5000 mg | ORAL_TABLET | Freq: Every day | ORAL | 3 refills | Status: DC

## 2021-07-05 MED ORDER — CARBAMAZEPINE 200 MG PO TABS: 200.0000 mg | ORAL_TABLET | Freq: Two times a day (BID) | ORAL | 3 refills | Status: DC

## 2021-07-05 MED ORDER — SIMVASTATIN 20 MG PO TABS: 20.0000 mg | ORAL_TABLET | Freq: Every day | ORAL | 3 refills | Status: DC

## 2021-07-05 NOTE — Progress Notes (Signed)
April Saunders 65 y.o.   Chief Complaint  Patient presents with   Hypertension    Follow up    HISTORY OF PRESENT ILLNESS: This is a 65 y.o. female with history of hypertension on amlodipine 5 mg daily here for follow-up. Blood pressure readings at home have been on the high side 140s over 80s.  Blood pressure elevated here in the office today. Has remote history of trigeminal neuralgia about 15 years ago.  Was once on medication. Since last June has been having episodes of right side periorbital sharp pain associated with "fluttering".  Several times during the day and lasting anything from seconds to long minutes.  Denies visual disturbance.  No headaches, vertigo, nausea or vomiting, or any other associated symptoms.  May be triggered by recent loss of both parents BP Readings from Last 3 Encounters:  01/05/21 131/71  07/06/20 140/80  02/15/20 (!) 112/58     Hypertension Pertinent negatives include no chest pain, headaches, palpitations or shortness of breath.    Prior to Admission medications   Medication Sig Start Date End Date Taking? Authorizing Provider  Acetaminophen (TYLENOL PO) Take by mouth as needed.    [provider]  amLODipine (NORVASC) 5 MG tablet TAKE 1 TABLET BY MOUTH DAILY 04/15/21   Horald Pollen, MD  Cholecalciferol (VITAMIN D-3) 125 MCG (5000 UT) TABS Take 1 tablet by mouth daily. 04/08/19   Hilts, Michael, MD  [facility] Liver Oil CAPS Take 1 capsule by mouth daily.    [provider]  fish oil-omega-3 fatty acids 1000 MG capsule Take 1 g by mouth daily.    [provider]  simvastatin (ZOCOR) 20 MG tablet TAKE 1 TABLET BY MOUTH DAILY 04/15/21   Horald Pollen, MD    Allergies  Allergen Reactions   Atorvastatin Calcium Itching   Lipitor [Atorvastatin]     Weakness    Lisinopril Other (See Comments)    Shoulder pain    Patient Active Problem List   Diagnosis Date Noted   Prediabetes 07/06/2020   Diverticulosis  07/06/2020   Dyslipidemia 12/30/2018   Hypertension    Obesity     Past Medical History:  Diagnosis Date   Abnormal MRI    brain   DJD (degenerative joint disease)    multiple joints   Hyperlipidemia    Hypertension    Lipoma    rt ankle   Obesity     Past Surgical History:  Procedure Laterality Date   ABDOMINAL HYSTERECTOMY     ? 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 Three adult child live independently and nearby.   Social Determinants of Health   Financial  Resource Strain: Not on file  Food Insecurity: Not on file  Transportation Needs: Not on file  Physical Activity: Not on file  Stress: Not on file  Social Connections: Not on file  Intimate Partner Violence: Not on file    Family History  Problem Relation Age of Onset   Diabetes Mellitus I Mother    Hypertension Brother    Diabetes Mellitus I Brother    Esophageal cancer Brother    Colon cancer Neg Hx    Stomach cancer Neg Hx    Rectal cancer Neg Hx      Review of Systems  Constitutional: Negative.  Negative for fever.  HENT: Negative.  Negative for congestion and sore throat.   Respiratory: Negative.  Negative for cough and shortness of breath.   Cardiovascular:  Negative for chest pain and palpitations.  Gastrointestinal:  Negative for  abdominal pain, diarrhea, nausea and vomiting.  Genitourinary: Negative.  Negative for dysuria and hematuria.  Skin: Negative.  Negative for rash.  Neurological:  Negative for dizziness and headaches.  All other systems reviewed and are negative.  Today's Vitals   07/05/21 0818  BP: (!) 148/82  Pulse: 64  Temp: 98.6 F (37 C)  TempSrc: Oral  SpO2: 99%  Weight: 173 lb (78.5 kg)  Height: '5\' 3"'$  (1.6 m)   Body mass index is 30.65 kg/m.  Physical Exam Vitals reviewed.  Constitutional:      Appearance: Normal appearance.  HENT:     Head: Normocephalic.     Right Ear: Tympanic membrane, ear canal and external ear normal.     Left Ear: Tympanic membrane, ear canal and external ear normal.     Mouth/Throat:     Mouth: Mucous membranes are moist.     Pharynx: Oropharynx is clear.  Eyes:     Extraocular Movements: Extraocular movements intact.     Conjunctiva/sclera: Conjunctivae normal.     Pupils: Pupils are equal, round, and reactive to light.  Neck:     Vascular: No carotid bruit.  Cardiovascular:     Rate and Rhythm: Normal rate and regular rhythm.     Pulses: Normal pulses.     Heart sounds: Normal heart sounds.  Pulmonary:     Effort: Pulmonary effort is normal.     Breath sounds: Normal breath sounds.  Musculoskeletal:        General: Normal range of motion.     Cervical back: Normal range of motion and neck supple. No tenderness.     Right lower leg: No edema.     Left lower leg: No edema.  Lymphadenopathy:     Cervical: No cervical adenopathy.  Skin:    General: Skin is warm and dry.     Capillary Refill: Capillary refill takes less than 2 seconds.  Neurological:     General: No focal deficit present.     Mental Status: She is alert and oriented to person, place, and time.     Sensory: No sensory deficit.     Motor: No weakness.     Coordination: Coordination normal.     Gait: Gait normal.  Psychiatric:        Mood and Affect: Mood normal.         Behavior: Behavior normal.     ASSESSMENT & PLAN: Chardai was seen today for hypertension.  Diagnoses and all orders for this visit:  Primary hypertension -     hydrochlorothiazide (HYDRODIURIL) 12.5 MG tablet; Take 1 tablet (12.5 mg total)  by mouth daily.  Prediabetes  Dyslipidemia -     simvastatin (ZOCOR) 20 MG tablet; Take 1 tablet (20 mg total) by mouth daily.  Trigeminal neuralgia -     carbamazepine (TEGRETOL) 200 MG tablet; Take 1 tablet (200 mg total) by mouth 2 (two) times daily.   Hypertension Uncontrolled hypertension.  Continue glipizide 5 mg daily and add hydrochlorothiazide 12.5 mg daily.  Dietary approach to stop hypertension discussed with patient.  Trigeminal neuralgia Has history of trigeminal neuralgia treated in the past.  Active at present time.  Affecting quality of life.  "Fluttering" description most likely muscle fasciculations.  No TIA or stroke symptoms.  Will start low-dose carbamazepine 200 mg twice a day and titrate as tolerated.  Needs follow-up with neurologist but patient does not have medical insurance at present time and wishes to wait until Medicare kicks in. Follow-up in 3 months.  Dyslipidemia Diet and nutrition discussed.  Stable.  Continue simvastatin 20 mg daily. The 10-year ASCVD risk score Mikey Bussing DC Brooke Bonito., et al., 2013) is: 9.6%   Values used to calculate the score:     Age: 61 years     Sex: Female     Is Non-Hispanic African American: Yes     Diabetic: No     Tobacco smoker: No     Systolic Blood Pressure: 123456 mmHg     Is BP treated: Yes     HDL Cholesterol: 60 mg/dL     Total Cholesterol: 157 mg/dL   Prediabetes Diet and nutrition discussed.  Advised to decrease amount of daily carbohydrate intake.   Patient Instructions  Trigeminal Neuralgia  Trigeminal neuralgia is a nerve disorder that causes severe pain on one side of the face. The pain may last from a few seconds to several minutes. The pain isusually only on one side  of the face. Symptoms may occur for days, weeks, or months and then go away for months oryears. The pain may return and be worse than before. What are the causes? This condition is caused by damage or pressure to a nerve in the head that is called the trigeminal nerve. An attack can be triggered by: Talking. Chewing. Putting on makeup. Washing your face. Shaving your face. Brushing your teeth. Touching your face. What increases the risk? You are more likely to develop this condition if you: Are 60 years of age or older. Are female. What are the signs or symptoms? The main symptom of this condition is severe pain in the: Jaw. Lips. Eyes. Nose. Scalp. Forehead. Face. The pain may be: Intense. Stabbing. Electric. Shock-like. How is this diagnosed? This condition is diagnosed with a physical exam. A CT scan or an MRI may bedone to rule out other conditions that can cause facial pain. How is this treated? This condition may be treated with: Avoiding the things that trigger your symptoms. Taking prescription medicines (anticonvulsants). Having surgery. This may be done in severe cases if other medical treatment does not provide relief. Having procedures such as ablation, thermal, or radiation therapy. It may take up to one month for treatment to start relieving the pain. Follow these instructions at home: Managing pain Learn as much as you can about how to manage your pain. Ask your health care provider if a pain specialist would be helpful. Consider talking with a mental health care provider (psychologist) about how to cope with the pain. Consider joining a pain support group. General instructions Take over-the-counter and prescription medicines only as told by your  health care provider. Avoid the things that trigger your symptoms. It may help to: Chew on the unaffected side of your mouth. Avoid touching your face. Avoid blasts of hot or cold air. Follow your treatment  plan as told by your health care provider. This may include: Cognitive or behavioral therapy. Gentle, regular exercise. Meditation or yoga. Aromatherapy. Keep all follow-up visits as told by your health care provider. You may need to be monitored closely to make sure treatment is working well for you. Where to find more information Facial Pain Association: fpa-support.org Contact a health care provider if: Your medicine is not helping your symptoms. You have side effects from the medicine used for treatment. You develop new, unexplained symptoms, such as: Double vision. Facial weakness. Facial numbness. Changes in hearing or balance. You feel depressed. Get help right away if: Your pain is severe and is not getting better. You develop suicidal thoughts. If you ever feel like you may hurt yourself or others, or have thoughts about taking your own life, get help right away. You can go to your nearest emergency department or call: Your local emergency services (911 in the [place] A suicide crisis helpline, such as the [place] at (337)764-7047. This is open 24 hours a day. Summary Trigeminal neuralgia is a nerve disorder that causes severe pain on one side of the face. The pain may last from a few seconds to several minutes. This condition is caused by damage or pressure to a nerve in the head that is called the trigeminal nerve. Treatment may include avoiding the things that trigger your symptoms, taking medicines, or having surgery or procedures. It may take up to one month for treatment to start relieving the pain. Avoid the things that trigger your symptoms. Keep all follow-up visits as told by your health care provider. You may need to be monitored closely to make sure treatment is working well for you. This information is not intended to replace advice given to you by your health care provider. Make sure you discuss any questions you have with your  healthcare provider. Document Revised: 09/15/2018 Document Reviewed: 09/15/2018 Elsevier Patient Education  2022 [place], [place] [facility]

## 2021-07-05 NOTE — Assessment & Plan Note (Addendum)
Has history of trigeminal neuralgia treated in the past.  Active at present time.  Affecting quality of life.  "Fluttering" description most likely muscle fasciculations.  No TIA or stroke symptoms.  Will start low-dose carbamazepine 200 mg twice a day and titrate as tolerated.  Needs follow-up with neurologist but patient does not have medical insurance at present time and wishes to wait until Medicare kicks in. Follow-up in 3 months.

## 2021-07-05 NOTE — Patient Instructions (Signed)
Trigeminal Neuralgia  Trigeminal neuralgia is a nerve disorder that causes severe pain on one side of the face. The pain may last from a few seconds to several minutes. The pain isusually only on one side of the face. Symptoms may occur for days, weeks, or months and then go away for months oryears. The pain may return and be worse than before. What are the causes? This condition is caused by damage or pressure to a nerve in the head that is called the trigeminal nerve. An attack can be triggered by: Talking. Chewing. Putting on makeup. Washing your face. Shaving your face. Brushing your teeth. Touching your face. What increases the risk? You are more likely to develop this condition if you: Are 64 years of age or older. Are female. What are the signs or symptoms? The main symptom of this condition is severe pain in the: Jaw. Lips. Eyes. Nose. Scalp. Forehead. Face. The pain may be: Intense. Stabbing. Electric. Shock-like. How is this diagnosed? This condition is diagnosed with a physical exam. A CT scan or an MRI may bedone to rule out other conditions that can cause facial pain. How is this treated? This condition may be treated with: Avoiding the things that trigger your symptoms. Taking prescription medicines (anticonvulsants). Having surgery. This may be done in severe cases if other medical treatment does not provide relief. Having procedures such as ablation, thermal, or radiation therapy. It may take up to one month for treatment to start relieving the pain. Follow these instructions at home: Managing pain Learn as much as you can about how to manage your pain. Ask your health care provider if a pain specialist would be helpful. Consider talking with a mental health care provider (psychologist) about how to cope with the pain. Consider joining a pain support group. General instructions Take over-the-counter and prescription medicines only as told by your health  care provider. Avoid the things that trigger your symptoms. It may help to: Chew on the unaffected side of your mouth. Avoid touching your face. Avoid blasts of hot or cold air. Follow your treatment plan as told by your health care provider. This may include: Cognitive or behavioral therapy. Gentle, regular exercise. Meditation or yoga. Aromatherapy. Keep all follow-up visits as told by your health care provider. You may need to be monitored closely to make sure treatment is working well for you. Where to find more information Facial Pain Association: fpa-support.org Contact a health care provider if: Your medicine is not helping your symptoms. You have side effects from the medicine used for treatment. You develop new, unexplained symptoms, such as: Double vision. Facial weakness. Facial numbness. Changes in hearing or balance. You feel depressed. Get help right away if: Your pain is severe and is not getting better. You develop suicidal thoughts. If you ever feel like you may hurt yourself or others, or have thoughts about taking your own life, get help right away. You can go to your nearest emergency department or call: Your local emergency services (911 in the [place] A suicide crisis helpline, such as the [facility] at 636-618-0257. This is open 24 hours a day. Summary Trigeminal neuralgia is a nerve disorder that causes severe pain on one side of the face. The pain may last from a few seconds to several minutes. This condition is caused by damage or pressure to a nerve in the head that is called the trigeminal nerve. Treatment may include avoiding the things that trigger your symptoms, taking medicines,  or having surgery or procedures. It may take up to one month for treatment to start relieving the pain. Avoid the things that trigger your symptoms. Keep all follow-up visits as told by your health care provider. You may need to be monitored closely  to make sure treatment is working well for you. This information is not intended to replace advice given to you by your health care provider. Make sure you discuss any questions you have with your healthcare provider. Document Revised: 09/15/2018 Document Reviewed: 09/15/2018 Elsevier Patient Education  2022 [employer].

## 2021-07-05 NOTE — Assessment & Plan Note (Signed)
Diet and nutrition discussed.  Advised to decrease amount of daily carbohydrate intake. 

## 2021-07-05 NOTE — Assessment & Plan Note (Signed)
Diet and nutrition discussed.  Stable.  Continue simvastatin 20 mg daily. The 10-year ASCVD risk score Mikey Bussing DC Brooke Bonito., et al., 2013) is: 9.6%   Values used to calculate the score:     Age: 65 years     Sex: Female     Is Non-Hispanic African American: Yes     Diabetic: No     Tobacco smoker: No     Systolic Blood Pressure: 123456 mmHg     Is BP treated: Yes     HDL Cholesterol: 60 mg/dL     Total Cholesterol: 157 mg/dL

## 2021-07-05 NOTE — Assessment & Plan Note (Signed)
Uncontrolled hypertension.  Continue glipizide 5 mg daily and add hydrochlorothiazide 12.5 mg daily.  Dietary approach to stop hypertension discussed with patient.

## 2021-08-21 ENCOUNTER — Encounter: Payer: Self-pay | Admitting: Emergency Medicine

## 2021-08-23 NOTE — Telephone Encounter (Signed)
What medicine is she referring to and what medicines she wants to go back on?

## 2021-08-24 ENCOUNTER — Encounter: Payer: Self-pay | Admitting: Emergency Medicine

## 2021-08-28 ENCOUNTER — Other Ambulatory Visit: Payer: Self-pay | Admitting: Emergency Medicine

## 2021-08-28 DIAGNOSIS — I10 Essential (primary) hypertension: Secondary | ICD-10-CM

## 2021-08-28 MED ORDER — AMLODIPINE BESYLATE 5 MG PO TABS: 5.0000 mg | ORAL_TABLET | Freq: Every day | ORAL | 3 refills | Status: DC

## 2021-08-28 NOTE — Telephone Encounter (Signed)
Stop hydrochlorothiazide and start amlodipine.  New prescription for amlodipine sent to pharmacy of record today.  Thanks.

## 2022-01-26 ENCOUNTER — Encounter: Payer: Self-pay | Admitting: Emergency Medicine

## 2022-03-16 ENCOUNTER — Other Ambulatory Visit: Payer: Self-pay | Admitting: Nurse Practitioner

## 2022-03-16 DIAGNOSIS — Z780 Asymptomatic menopausal state: Secondary | ICD-10-CM

## 2022-03-16 DIAGNOSIS — Z1231 Encounter for screening mammogram for malignant neoplasm of breast: Secondary | ICD-10-CM

## 2022-05-04 ENCOUNTER — Emergency Department (HOSPITAL_COMMUNITY)
Admission: EM | Admit: 2022-05-04 | Discharge: 2022-05-05 | Discharge status: Against Medical Advice | Payer: Medicare Other | Attending: Emergency Medicine | Admitting: Emergency Medicine

## 2022-05-04 ENCOUNTER — Other Ambulatory Visit: Payer: Self-pay

## 2022-05-04 ENCOUNTER — Encounter (HOSPITAL_COMMUNITY): Payer: Self-pay

## 2022-05-04 DIAGNOSIS — I10 Essential (primary) hypertension: Secondary | ICD-10-CM | POA: Diagnosis not present

## 2022-05-04 DIAGNOSIS — R5383 Other fatigue: Secondary | ICD-10-CM | POA: Insufficient documentation

## 2022-05-04 DIAGNOSIS — Z5321 Procedure and treatment not carried out due to patient leaving prior to being seen by health care provider: Secondary | ICD-10-CM | POA: Diagnosis not present

## 2022-05-04 DIAGNOSIS — R634 Abnormal weight loss: Secondary | ICD-10-CM | POA: Diagnosis not present

## 2022-05-04 DIAGNOSIS — M545 Low back pain, unspecified: Secondary | ICD-10-CM | POA: Diagnosis not present

## 2022-05-04 DIAGNOSIS — E876 Hypokalemia: Secondary | ICD-10-CM | POA: Diagnosis not present

## 2022-05-04 DIAGNOSIS — Z79899 Other long term (current) drug therapy: Secondary | ICD-10-CM | POA: Insufficient documentation

## 2022-05-04 LAB — CBC WITH DIFFERENTIAL/PLATELET
Abs Immature Granulocytes: 0.01 10*3/uL (ref 0.00–0.07)
Basophils Absolute: 0 10*3/uL (ref 0.0–0.1)
Basophils Relative: 1 %
Eosinophils Absolute: 0 10*3/uL (ref 0.0–0.5)
Eosinophils Relative: 0 %
HCT: 35.8 % — ABNORMAL LOW (ref 36.0–46.0)
Hemoglobin: 11.5 g/dL — ABNORMAL LOW (ref 12.0–15.0)
Immature Granulocytes: 0 %
Lymphocytes Relative: 23 %
Lymphs Abs: 1.4 10*3/uL (ref 0.7–4.0)
MCH: 26.2 pg (ref 26.0–34.0)
MCHC: 32.1 g/dL (ref 30.0–36.0)
MCV: 81.5 fL (ref 80.0–100.0)
Monocytes Absolute: 0.4 10*3/uL (ref 0.1–1.0)
Monocytes Relative: 6 %
Neutro Abs: 4.5 10*3/uL (ref 1.7–7.7)
Neutrophils Relative %: 70 %
Platelets: 243 10*3/uL (ref 150–400)
RBC: 4.39 MIL/uL (ref 3.87–5.11)
RDW: 13.8 % (ref 11.5–15.5)
WBC: 6.4 10*3/uL (ref 4.0–10.5)
nRBC: 0 % (ref 0.0–0.2)

## 2022-05-04 LAB — URINALYSIS, ROUTINE W REFLEX MICROSCOPIC
Bilirubin Urine: NEGATIVE
Glucose, UA: NEGATIVE mg/dL
Hgb urine dipstick: NEGATIVE
Ketones, ur: NEGATIVE mg/dL
Leukocytes,Ua: NEGATIVE
Nitrite: NEGATIVE
Protein, ur: NEGATIVE mg/dL
Specific Gravity, Urine: 1.003 — ABNORMAL LOW (ref 1.005–1.030)
pH: 5 (ref 5.0–8.0)

## 2022-05-04 LAB — BASIC METABOLIC PANEL
Anion gap: 12 (ref 5–15)
BUN: 8 mg/dL (ref 8–23)
CO2: 23 mmol/L (ref 22–32)
Calcium: 10.1 mg/dL (ref 8.9–10.3)
Chloride: 103 mmol/L (ref 98–111)
Creatinine, Ser: 0.79 mg/dL (ref 0.44–1.00)
GFR, Estimated: >60 mL/min (ref >60)
Glucose, Bld: 133 mg/dL — ABNORMAL HIGH (ref 70–99)
Potassium: 3.4 mmol/L — ABNORMAL LOW (ref 3.5–5.1)
Sodium: 138 mmol/L (ref 135–145)

## 2022-05-04 LAB — T4, FREE: Free T4: 1.02 ng/dL (ref 0.61–1.12)

## 2022-05-04 LAB — TSH: TSH: 0.823 u[IU]/mL (ref 0.350–4.500)

## 2022-05-04 NOTE — ED Notes (Signed)
Pt name called for vitals 3x, no response

## 2022-05-05 ENCOUNTER — Encounter (HOSPITAL_BASED_OUTPATIENT_CLINIC_OR_DEPARTMENT_OTHER): Payer: Self-pay | Admitting: Emergency Medicine

## 2022-05-05 ENCOUNTER — Emergency Department (HOSPITAL_BASED_OUTPATIENT_CLINIC_OR_DEPARTMENT_OTHER)
Admission: EM | Admit: 2022-05-05 | Discharge: 2022-05-05 | Disposition: A | Discharge status: Home / Self Care | Payer: Medicare Other | Source: Home / Self Care | Attending: Emergency Medicine | Admitting: Emergency Medicine

## 2022-05-05 DIAGNOSIS — I10 Essential (primary) hypertension: Secondary | ICD-10-CM | POA: Insufficient documentation

## 2022-05-05 DIAGNOSIS — E876 Hypokalemia: Secondary | ICD-10-CM | POA: Insufficient documentation

## 2022-05-05 DIAGNOSIS — R5383 Other fatigue: Secondary | ICD-10-CM

## 2022-05-05 DIAGNOSIS — Z79899 Other long term (current) drug therapy: Secondary | ICD-10-CM | POA: Insufficient documentation

## 2022-05-05 LAB — URINALYSIS, ROUTINE W REFLEX MICROSCOPIC
Bilirubin Urine: NEGATIVE
Glucose, UA: NEGATIVE mg/dL
Ketones, ur: NEGATIVE mg/dL
Nitrite: NEGATIVE
Protein, ur: NEGATIVE mg/dL
Specific Gravity, Urine: 1.02 (ref 1.005–1.030)
pH: 5.5 (ref 5.0–8.0)

## 2022-05-05 LAB — URINALYSIS, MICROSCOPIC (REFLEX)

## 2022-05-05 LAB — BASIC METABOLIC PANEL
Anion gap: 7 (ref 5–15)
BUN: 7 mg/dL — ABNORMAL LOW (ref 8–23)
CO2: 26 mmol/L (ref 22–32)
Calcium: 9.8 mg/dL (ref 8.9–10.3)
Chloride: 105 mmol/L (ref 98–111)
Creatinine, Ser: 0.85 mg/dL (ref 0.44–1.00)
GFR, Estimated: >60 mL/min (ref >60)
Glucose, Bld: 134 mg/dL — ABNORMAL HIGH (ref 70–99)
Potassium: 3.1 mmol/L — ABNORMAL LOW (ref 3.5–5.1)
Sodium: 138 mmol/L (ref 135–145)

## 2022-05-05 LAB — CBC
HCT: 36.2 % (ref 36.0–46.0)
Hemoglobin: 11.8 g/dL — ABNORMAL LOW (ref 12.0–15.0)
MCH: 26.8 pg (ref 26.0–34.0)
MCHC: 32.6 g/dL (ref 30.0–36.0)
MCV: 82.1 fL (ref 80.0–100.0)
Platelets: 232 10*3/uL (ref 150–400)
RBC: 4.41 MIL/uL (ref 3.87–5.11)
RDW: 14 % (ref 11.5–15.5)
WBC: 5 10*3/uL (ref 4.0–10.5)
nRBC: 0 % (ref 0.0–0.2)

## 2022-05-05 LAB — CBG MONITORING, ED: Glucose-Capillary: 108 mg/dL — ABNORMAL HIGH (ref 70–99)

## 2022-05-05 MED ORDER — POTASSIUM CHLORIDE CRYS ER 20 MEQ PO TBCR
40.0000 meq | EXTENDED_RELEASE_TABLET | Freq: Once | ORAL | Status: AC
  Administered 2022-05-05: 40 meq via ORAL
  Filled 2022-05-05: qty 2

## 2022-05-05 MED ORDER — POTASSIUM CHLORIDE CRYS ER 20 MEQ PO TBCR: 20.0000 meq | EXTENDED_RELEASE_TABLET | Freq: Every day | ORAL | 0 refills | Status: DC

## 2022-05-05 MED ORDER — CEPHALEXIN 500 MG PO CAPS
500.0000 mg | ORAL_CAPSULE | Freq: Four times a day (QID) | ORAL | 0 refills | Status: DC
Start: 2022-05-05 — End: 2022-06-01

## 2022-05-05 NOTE — ED Notes (Signed)
IV removed from patients RA

## 2022-05-07 ENCOUNTER — Other Ambulatory Visit: Payer: Self-pay | Admitting: Nurse Practitioner

## 2022-05-07 ENCOUNTER — Ambulatory Visit
Admission: RE | Admit: 2022-05-07 | Discharge: 2022-05-07 | Disposition: A | Discharge status: Home / Self Care | Payer: Medicare Other | Source: Ambulatory Visit | Attending: Nurse Practitioner | Admitting: Nurse Practitioner

## 2022-05-07 DIAGNOSIS — M170 Bilateral primary osteoarthritis of knee: Secondary | ICD-10-CM

## 2022-05-07 DIAGNOSIS — R0789 Other chest pain: Secondary | ICD-10-CM

## 2022-05-12 DIAGNOSIS — I10 Essential (primary) hypertension: Secondary | ICD-10-CM | POA: Diagnosis present

## 2022-05-12 DIAGNOSIS — E669 Obesity, unspecified: Secondary | ICD-10-CM | POA: Diagnosis present

## 2022-05-12 DIAGNOSIS — C8339 Diffuse large B-cell lymphoma, extranodal and solid organ sites: Principal | ICD-10-CM | POA: Diagnosis present

## 2022-05-12 DIAGNOSIS — M1990 Unspecified osteoarthritis, unspecified site: Secondary | ICD-10-CM | POA: Diagnosis present

## 2022-05-12 DIAGNOSIS — I959 Hypotension, unspecified: Secondary | ICD-10-CM | POA: Diagnosis not present

## 2022-05-12 DIAGNOSIS — T380X5A Adverse effect of glucocorticoids and synthetic analogues, initial encounter: Secondary | ICD-10-CM | POA: Diagnosis not present

## 2022-05-12 DIAGNOSIS — E785 Hyperlipidemia, unspecified: Secondary | ICD-10-CM | POA: Diagnosis present

## 2022-05-12 DIAGNOSIS — E876 Hypokalemia: Secondary | ICD-10-CM | POA: Diagnosis present

## 2022-05-12 DIAGNOSIS — D72829 Elevated white blood cell count, unspecified: Secondary | ICD-10-CM | POA: Diagnosis not present

## 2022-05-12 DIAGNOSIS — Z8249 Family history of ischemic heart disease and other diseases of the circulatory system: Secondary | ICD-10-CM

## 2022-05-12 DIAGNOSIS — Z66 Do not resuscitate: Secondary | ICD-10-CM | POA: Diagnosis not present

## 2022-05-12 DIAGNOSIS — R739 Hyperglycemia, unspecified: Secondary | ICD-10-CM | POA: Diagnosis not present

## 2022-05-12 DIAGNOSIS — D496 Neoplasm of unspecified behavior of brain: Secondary | ICD-10-CM | POA: Diagnosis not present

## 2022-05-12 DIAGNOSIS — G936 Cerebral edema: Secondary | ICD-10-CM | POA: Diagnosis present

## 2022-05-12 DIAGNOSIS — Z6824 Body mass index (BMI) 24.0-24.9, adult: Secondary | ICD-10-CM

## 2022-05-12 DIAGNOSIS — G8194 Hemiplegia, unspecified affecting left nondominant side: Secondary | ICD-10-CM | POA: Diagnosis present

## 2022-05-12 DIAGNOSIS — Z87891 Personal history of nicotine dependence: Secondary | ICD-10-CM

## 2022-05-12 DIAGNOSIS — Z888 Allergy status to other drugs, medicaments and biological substances status: Secondary | ICD-10-CM

## 2022-05-12 DIAGNOSIS — E871 Hypo-osmolality and hyponatremia: Secondary | ICD-10-CM | POA: Diagnosis present

## 2022-05-12 DIAGNOSIS — Z800 Family history of malignant neoplasm of digestive organs: Secondary | ICD-10-CM

## 2022-05-12 DIAGNOSIS — Z833 Family history of diabetes mellitus: Secondary | ICD-10-CM

## 2022-05-12 DIAGNOSIS — G500 Trigeminal neuralgia: Secondary | ICD-10-CM | POA: Diagnosis present

## 2022-05-12 DIAGNOSIS — E8809 Other disorders of plasma-protein metabolism, not elsewhere classified: Secondary | ICD-10-CM | POA: Diagnosis not present

## 2022-05-13 ENCOUNTER — Inpatient Hospital Stay (HOSPITAL_COMMUNITY): Payer: Medicare Other

## 2022-05-13 ENCOUNTER — Encounter (HOSPITAL_COMMUNITY): Payer: Self-pay

## 2022-05-13 ENCOUNTER — Other Ambulatory Visit: Payer: Self-pay

## 2022-05-13 ENCOUNTER — Inpatient Hospital Stay (HOSPITAL_COMMUNITY)
Admission: EM | Admit: 2022-05-13 | Discharge: 2022-06-01 | DRG: 820 | Disposition: A | Discharge status: Skilled Nursing Facility | Payer: Medicare Other | Attending: Internal Medicine | Admitting: Internal Medicine

## 2022-05-13 ENCOUNTER — Emergency Department (HOSPITAL_COMMUNITY): Payer: Medicare Other

## 2022-05-13 DIAGNOSIS — G936 Cerebral edema: Secondary | ICD-10-CM | POA: Diagnosis present

## 2022-05-13 DIAGNOSIS — Z7189 Other specified counseling: Secondary | ICD-10-CM | POA: Diagnosis not present

## 2022-05-13 DIAGNOSIS — Z6824 Body mass index (BMI) 24.0-24.9, adult: Secondary | ICD-10-CM | POA: Diagnosis not present

## 2022-05-13 DIAGNOSIS — E871 Hypo-osmolality and hyponatremia: Secondary | ICD-10-CM | POA: Diagnosis present

## 2022-05-13 DIAGNOSIS — E876 Hypokalemia: Secondary | ICD-10-CM | POA: Diagnosis present

## 2022-05-13 DIAGNOSIS — Z888 Allergy status to other drugs, medicaments and biological substances status: Secondary | ICD-10-CM | POA: Diagnosis not present

## 2022-05-13 DIAGNOSIS — D496 Neoplasm of unspecified behavior of brain: Principal | ICD-10-CM | POA: Diagnosis present

## 2022-05-13 DIAGNOSIS — R739 Hyperglycemia, unspecified: Secondary | ICD-10-CM | POA: Diagnosis not present

## 2022-05-13 DIAGNOSIS — L8990 Pressure ulcer of unspecified site, unspecified stage: Secondary | ICD-10-CM | POA: Insufficient documentation

## 2022-05-13 DIAGNOSIS — Z833 Family history of diabetes mellitus: Secondary | ICD-10-CM | POA: Diagnosis not present

## 2022-05-13 DIAGNOSIS — I10 Essential (primary) hypertension: Secondary | ICD-10-CM | POA: Diagnosis present

## 2022-05-13 DIAGNOSIS — C8339 Diffuse large B-cell lymphoma, extranodal and solid organ sites: Secondary | ICD-10-CM | POA: Diagnosis present

## 2022-05-13 DIAGNOSIS — E669 Obesity, unspecified: Secondary | ICD-10-CM | POA: Diagnosis present

## 2022-05-13 DIAGNOSIS — G500 Trigeminal neuralgia: Secondary | ICD-10-CM | POA: Diagnosis present

## 2022-05-13 DIAGNOSIS — E785 Hyperlipidemia, unspecified: Secondary | ICD-10-CM | POA: Diagnosis present

## 2022-05-13 DIAGNOSIS — R569 Unspecified convulsions: Secondary | ICD-10-CM

## 2022-05-13 DIAGNOSIS — G8194 Hemiplegia, unspecified affecting left nondominant side: Secondary | ICD-10-CM | POA: Diagnosis present

## 2022-05-13 DIAGNOSIS — Z66 Do not resuscitate: Secondary | ICD-10-CM | POA: Diagnosis not present

## 2022-05-13 DIAGNOSIS — E8809 Other disorders of plasma-protein metabolism, not elsewhere classified: Secondary | ICD-10-CM | POA: Diagnosis not present

## 2022-05-13 DIAGNOSIS — D72829 Elevated white blood cell count, unspecified: Secondary | ICD-10-CM | POA: Diagnosis not present

## 2022-05-13 DIAGNOSIS — Z515 Encounter for palliative care: Secondary | ICD-10-CM | POA: Diagnosis not present

## 2022-05-13 DIAGNOSIS — M1990 Unspecified osteoarthritis, unspecified site: Secondary | ICD-10-CM | POA: Diagnosis present

## 2022-05-13 DIAGNOSIS — T380X5A Adverse effect of glucocorticoids and synthetic analogues, initial encounter: Secondary | ICD-10-CM | POA: Diagnosis not present

## 2022-05-13 DIAGNOSIS — I959 Hypotension, unspecified: Secondary | ICD-10-CM | POA: Diagnosis not present

## 2022-05-13 DIAGNOSIS — Z87891 Personal history of nicotine dependence: Secondary | ICD-10-CM | POA: Diagnosis not present

## 2022-05-13 DIAGNOSIS — Z8249 Family history of ischemic heart disease and other diseases of the circulatory system: Secondary | ICD-10-CM | POA: Diagnosis not present

## 2022-05-13 DIAGNOSIS — Z800 Family history of malignant neoplasm of digestive organs: Secondary | ICD-10-CM | POA: Diagnosis not present

## 2022-05-13 LAB — COMPREHENSIVE METABOLIC PANEL
ALT: 22 U/L (ref 0–44)
AST: 27 U/L (ref 15–41)
Albumin: 4.5 g/dL (ref 3.5–5.0)
Alkaline Phosphatase: 51 U/L (ref 38–126)
Anion gap: 13 (ref 5–15)
BUN: 14 mg/dL (ref 8–23)
CO2: 22 mmol/L (ref 22–32)
Calcium: 10.6 mg/dL — ABNORMAL HIGH (ref 8.9–10.3)
Chloride: 106 mmol/L (ref 98–111)
Creatinine, Ser: 0.88 mg/dL (ref 0.44–1.00)
GFR, Estimated: >60 mL/min (ref >60)
Glucose, Bld: 126 mg/dL — ABNORMAL HIGH (ref 70–99)
Potassium: 4 mmol/L (ref 3.5–5.1)
Sodium: 141 mmol/L (ref 135–145)
Total Bilirubin: 1.7 mg/dL — ABNORMAL HIGH (ref 0.3–1.2)
Total Protein: 7.9 g/dL (ref 6.5–8.1)

## 2022-05-13 LAB — CBC WITH DIFFERENTIAL/PLATELET
Abs Immature Granulocytes: 0.01 10*3/uL (ref 0.00–0.07)
Basophils Absolute: 0 10*3/uL (ref 0.0–0.1)
Basophils Relative: 1 %
Eosinophils Absolute: 0.1 10*3/uL (ref 0.0–0.5)
Eosinophils Relative: 1 %
HCT: 41.2 % (ref 36.0–46.0)
Hemoglobin: 13.3 g/dL (ref 12.0–15.0)
Immature Granulocytes: 0 %
Lymphocytes Relative: 26 %
Lymphs Abs: 1.8 10*3/uL (ref 0.7–4.0)
MCH: 26.6 pg (ref 26.0–34.0)
MCHC: 32.3 g/dL (ref 30.0–36.0)
MCV: 82.4 fL (ref 80.0–100.0)
Monocytes Absolute: 0.5 10*3/uL (ref 0.1–1.0)
Monocytes Relative: 7 %
Neutro Abs: 4.6 10*3/uL (ref 1.7–7.7)
Neutrophils Relative %: 65 %
Platelets: 266 10*3/uL (ref 150–400)
RBC: 5 MIL/uL (ref 3.87–5.11)
RDW: 14.2 % (ref 11.5–15.5)
WBC: 7.1 10*3/uL (ref 4.0–10.5)
nRBC: 0 % (ref 0.0–0.2)

## 2022-05-13 LAB — BLOOD GAS, ARTERIAL
Acid-Base Excess: 0.8 mmol/L (ref 0.0–2.0)
Bicarbonate: 25.3 mmol/L (ref 20.0–28.0)
O2 Saturation: 98.4 %
Patient temperature: 37
pCO2 arterial: 39 mmHg (ref 32–48)
pH, Arterial: 7.42 (ref 7.35–7.45)
pO2, Arterial: 84 mmHg (ref 83–108)

## 2022-05-13 LAB — URINALYSIS, ROUTINE W REFLEX MICROSCOPIC
Bilirubin Urine: NEGATIVE
Glucose, UA: NEGATIVE mg/dL
Hgb urine dipstick: NEGATIVE
Ketones, ur: 5 mg/dL — AB
Leukocytes,Ua: NEGATIVE
Nitrite: NEGATIVE
Protein, ur: NEGATIVE mg/dL
Specific Gravity, Urine: 1.021 (ref 1.005–1.030)
pH: 5 (ref 5.0–8.0)

## 2022-05-13 MED ORDER — GADOBUTROL 1 MMOL/ML IV SOLN
6.0000 mL | Freq: Once | INTRAVENOUS | Status: AC | PRN
  Administered 2022-05-13: 6 mL via INTRAVENOUS

## 2022-05-13 MED ORDER — ALBUMIN HUMAN 25 % IV SOLN
12.5000 g | Freq: Once | INTRAVENOUS | Status: AC
  Administered 2022-05-13: 12.5 g via INTRAVENOUS
  Filled 2022-05-13: qty 50

## 2022-05-13 MED ORDER — SODIUM CHLORIDE 0.9 % IV BOLUS
500.0000 mL | Freq: Once | INTRAVENOUS | Status: AC
  Administered 2022-05-13: 500 mL via INTRAVENOUS

## 2022-05-13 MED ORDER — DEXAMETHASONE SODIUM PHOSPHATE 4 MG/ML IJ SOLN
4.0000 mg | Freq: Four times a day (QID) | INTRAMUSCULAR | Status: DC
  Administered 2022-05-13 – 2022-05-14 (×4): 4 mg via INTRAVENOUS
  Filled 2022-05-13 (×4): qty 1

## 2022-05-13 MED ORDER — SIMVASTATIN 20 MG PO TABS
20.0000 mg | ORAL_TABLET | Freq: Every day | ORAL | Status: DC
  Administered 2022-05-13 – 2022-06-01 (×18): 20 mg via ORAL
  Filled 2022-05-13 (×20): qty 1

## 2022-05-13 MED ORDER — LEVETIRACETAM IN NACL 1000 MG/100ML IV SOLN
1000.0000 mg | Freq: Once | INTRAVENOUS | Status: AC
Start: 2022-05-13 — End: 2022-05-13
  Administered 2022-05-13: 1000 mg via INTRAVENOUS
  Filled 2022-05-13: qty 100

## 2022-05-13 MED ORDER — DEXAMETHASONE SODIUM PHOSPHATE 10 MG/ML IJ SOLN
10.0000 mg | Freq: Once | INTRAMUSCULAR | Status: AC
  Administered 2022-05-13: 10 mg via INTRAVENOUS
  Filled 2022-05-13: qty 1

## 2022-05-13 MED ORDER — ONDANSETRON HCL 4 MG/2ML IJ SOLN: 4.0000 mg | Freq: Four times a day (QID) | INTRAMUSCULAR | Status: DC | PRN

## 2022-05-13 MED ORDER — ONDANSETRON HCL 4 MG PO TABS: 4.0000 mg | ORAL_TABLET | Freq: Four times a day (QID) | ORAL | Status: DC | PRN

## 2022-05-13 MED ORDER — LORAZEPAM 2 MG/ML IJ SOLN
2.0000 mg | INTRAMUSCULAR | Status: DC | PRN
  Administered 2022-05-14 – 2022-05-21 (×3): 2 mg via INTRAVENOUS
  Filled 2022-05-13 (×3): qty 1

## 2022-05-13 MED ORDER — LEVETIRACETAM 500 MG PO TABS: 500.0000 mg | ORAL_TABLET | Freq: Two times a day (BID) | ORAL | Status: DC

## 2022-05-13 MED ORDER — ALBUTEROL SULFATE (2.5 MG/3ML) 0.083% IN NEBU: 2.5000 mg | INHALATION_SOLUTION | RESPIRATORY_TRACT | Status: DC | PRN

## 2022-05-13 MED ORDER — LEVETIRACETAM IN NACL 500 MG/100ML IV SOLN
500.0000 mg | Freq: Two times a day (BID) | INTRAVENOUS | Status: DC
Start: 2022-05-13 — End: 2022-05-21
  Administered 2022-05-13 – 2022-05-21 (×17): 500 mg via INTRAVENOUS
  Filled 2022-05-13 (×17): qty 100

## 2022-05-13 MED ORDER — SODIUM CHLORIDE 0.9 % IV SOLN: INTRAVENOUS | Status: AC

## 2022-05-13 MED ORDER — ACETAMINOPHEN 500 MG PO TABS
500.0000 mg | ORAL_TABLET | Freq: Four times a day (QID) | ORAL | Status: DC | PRN
  Administered 2022-05-13 – 2022-05-30 (×8): 500 mg via ORAL
  Filled 2022-05-13 (×8): qty 1

## 2022-05-13 NOTE — Progress Notes (Signed)
EEG complete - results pending 

## 2022-05-13 NOTE — Plan of Care (Signed)

## 2022-05-13 NOTE — ED Notes (Addendum)
Pt taken to restroom via steady. Pt unable to urinate. RN and MD aware.

## 2022-05-13 NOTE — ED Provider Notes (Signed)
[facility] DEPT Provider Note   CSN: [id number] Arrival date & time: 05/12/22  2346     History  Chief Complaint  Patient presents with   Weakness    April Saunders is a 66 y.o. female.  Patient presents to the emergency department for evaluation of generalized weakness.  Symptoms have been ongoing for a week.  Patient has been noted to be exhibiting decreased ability to walk secondary to generalized weakness and being off balance.  She has had poor oral intake including food and liquids.  Patient reports that she has noticed decreased urine output but has not had any dysuria.        Home Medications Prior to Admission medications   Medication Sig Start Date End Date Taking? Authorizing Provider  Acetaminophen (TYLENOL PO) Take by mouth as needed.    [provider]  amLODipine (NORVASC) 5 MG tablet Take 1 tablet (5 mg total) by mouth daily. 08/28/21 11/26/21  Horald Pollen, MD  carbamazepine (TEGRETOL) 200 MG tablet Take 1 tablet (200 mg total) by mouth 2 (two) times daily. 07/05/21 08/04/21  Horald Pollen, MD  cephALEXin (KEFLEX) 500 MG capsule Take 1 capsule (500 mg total) by mouth 4 (four) times daily. 05/05/22   Redwine, Madison A, PA-C  Cholecalciferol (VITAMIN D-3) 125 MCG (5000 UT) TABS Take 1 tablet by mouth daily. 04/08/19   Hilts, Michael, MD  [facility] Liver Oil CAPS Take 1 capsule by mouth daily.    [provider]  fish oil-omega-3 fatty acids 1000 MG capsule Take 1 g by mouth daily.    [provider]  hydrochlorothiazide (HYDRODIURIL) 12.5 MG tablet Take 1 tablet (12.5 mg total) by mouth daily. 07/05/21   Horald Pollen, MD  potassium chloride SA (KLOR-CON M) 20 MEQ tablet Take 1 tablet (20 mEq total) by mouth daily. 05/05/22   Redwine, Madison A, PA-C  simvastatin (ZOCOR) 20 MG tablet Take 1 tablet (20 mg total) by mouth daily. 07/05/21   Horald Pollen, MD  Vitamin D, Ergocalciferol,  (DRISDOL) 1.25 MG (50000 UNIT) CAPS capsule Take 50,000 Units by mouth once a week. 03/16/22   [provider]      Allergies    Atorvastatin calcium, Lipitor [atorvastatin], and Lisinopril    Review of Systems   Review of Systems  Physical Exam Updated Vital Signs BP 108/63   Pulse 73   Temp 98.9 F (37.2 C) (Oral)   Resp 15   Ht '5\' 3"'$  (1.6 m)   Wt 64.4 kg   SpO2 98%   BMI 25.15 kg/m  Physical Exam Vitals and nursing note reviewed.  Constitutional:      General: She is not in acute distress.    Appearance: She is well-developed.  HENT:     Head: Normocephalic and atraumatic.     Mouth/Throat:     Mouth: Mucous membranes are moist.  Eyes:     General: Vision grossly intact. Gaze aligned appropriately.     Extraocular Movements: Extraocular movements intact.     Conjunctiva/sclera: Conjunctivae normal.  Cardiovascular:     Rate and Rhythm: Normal rate and regular rhythm.     Pulses: Normal pulses.     Heart sounds: Normal heart sounds, S1 normal and S2 normal. No murmur heard.    No friction rub. No gallop.  Pulmonary:     Effort: Pulmonary effort is normal. No respiratory distress.     Breath sounds: Normal breath sounds.  Abdominal:  General: Bowel sounds are normal.     Palpations: Abdomen is soft.     Tenderness: There is no abdominal tenderness. There is no guarding or rebound.     Hernia: No hernia is present.  Musculoskeletal:        General: No swelling.     Cervical back: Full passive range of motion without pain, normal range of motion and neck supple. No spinous process tenderness or muscular tenderness. Normal range of motion.     Right lower leg: No edema.     Left lower leg: No edema.  Skin:    General: Skin is warm and dry.     Capillary Refill: Capillary refill takes less than 2 seconds.     Findings: No ecchymosis, erythema, rash or wound.  Neurological:     General: No focal deficit present.     Mental Status: She is alert and  oriented to person, place, and time.     GCS: GCS eye subscore is 4. GCS verbal subscore is 5. GCS motor subscore is 6.     Cranial Nerves: Cranial nerves 2-12 are intact.     Sensory: Sensation is intact.     Motor: Motor function is intact.     Coordination: Coordination is intact.     Comments: Left upper extremity grip, flexion extension decreased compared to the right but she has chronic shoulder problems and pain in the shoulder with movement.  Psychiatric:        Attention and Perception: Attention normal.        Mood and Affect: Mood normal.        Speech: Speech normal.        Behavior: Behavior normal.     ED Results / Procedures / Treatments   Labs (all labs ordered are listed, but only abnormal results are displayed) Labs Reviewed  COMPREHENSIVE METABOLIC PANEL - Abnormal; Notable for the following components:      Result Value   Glucose, Bld 126 (*)    Calcium 10.6 (*)    Total Bilirubin 1.7 (*)    All other components within normal limits  URINALYSIS, ROUTINE W REFLEX MICROSCOPIC - Abnormal; Notable for the following components:   Ketones, ur 5 (*)    All other components within normal limits  CBC WITH DIFFERENTIAL/PLATELET    EKG None  Radiology CT HEAD WO CONTRAST (5MM)  Addendum Date: 05/13/2022   ADDENDUM REPORT: 05/13/2022 05:39 ADDENDUM: Study discussed by telephone with Dr. Joseph Berkshire on 05/13/2022 at 0527 hours. Electronically Signed   By: Genevie Ann M.D.   On: 05/13/2022 05:39   Result Date: 05/13/2022 CLINICAL DATA:  66 year old female turned mental status. EXAM: CT HEAD WITHOUT CONTRAST TECHNIQUE: Contiguous axial images were obtained from the base of the skull through the vertex without intravenous contrast. RADIATION DOSE REDUCTION: This exam was performed according to the departmental dose-optimization program which includes automated exposure control, adjustment of the mA and/or kV according to patient size and/or use of iterative reconstruction  technique. COMPARISON:  Brain MRI 02/08/2010. FINDINGS: Brain: Round and fairly circumscribed slightly hyperdense lesion centered at the right deep gray nuclei is nearly 2.5 cm diameter on series 2, image 17. That area was unremarkable on the 2011 CT. And there is a large volume of surrounding white matter hypodensity in the right hemisphere which most resembles vasogenic edema. Regional mass effect including effaced right lateral ventricle. Leftward midline shift of 7 mm. Edema appears to track into the right midbrain, brainstem  on series 5, image 42. Effaced suprasellar cistern. Other basilar cisterns are patent. Questionably also asymmetric edema in the deep right cerebellum series 2 image 10. But no 2nd mass lesion is evident. No convincing acute intracranial hemorrhage or cortically based infarct. The left occipital horn is mildly dilated, and brainstem edema does appear to affect the level of the cerebral aqueduct. Mild if any transependymal edema at this time. Vascular: No suspicious intracranial vascular hyperdensity. Skull: No acute or suspicious osseous lesion identified. Sinuses/Orbits: Visualized paranasal sinuses and mastoids are clear. Other: Visualized orbits and scalp soft tissues are within normal limits. IMPRESSION: 1. 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 2.5 cm hypercellular appearing mass centered at the right deep gray nuclei. Questionable 2nd lesion in the right cerebellum. Recommend MRI Head without and with contrast to further characterize. 2. 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 Electronically Signed: By: Genevie Ann M.D. On: 05/13/2022 05:10    Procedures Procedures    Medications Ordered in ED Medications  sodium chloride 0.9 % bolus 500 mL (0 mLs Intravenous Stopped 05/13/22 0517)     ED Course/ Medical Decision Making/ A&P                           Medical Decision Making Amount and/or Complexity of Data Reviewed Labs: ordered. Radiology: ordered.   Patient presents to the emergency department for evaluation of generalized weakness.  Patient has not been feeling well for several weeks but for the last week has been having difficulty walking.  She has not specifically identified a unilateral neurologic problem.  Family has not noticed facial droop, trouble with speech or one-sided weakness either.  Examination upon arrival did reveal deficit of the left upper extremity but it was difficult to ascertain if this was painful and admission as she reports chronic shoulder problems.  Patient's basic lab work was unremarkable.  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  Patient discussed with Dr. Glenford Peers, on-call for neurosurgery.  He has reviewed the images.  Recommends Decadron 10 mg IV now followed by Decadron 4 mg every 6 hours, Keppra 500 mg twice daily and transfer to [facility].  MRI with and without contrast can be performed after admission at [facility].  Imaging has been ordered.  CRITICAL CARE Performed by: Orpah Greek   Total critical care time: 33 minutes  Critical care time was exclusive of separately billable procedures and treating other patients.  Critical care was necessary to treat or prevent imminent or life-threatening deterioration.  Critical care was time spent personally by me on the following activities: development of treatment plan with patient and/or surrogate as well as nursing, discussions with consultants, evaluation of patient's response to treatment, examination of patient, obtaining history from patient or surrogate, ordering and performing treatments and interventions, ordering and review of laboratory studies, ordering and  review of radiographic studies, pulse oximetry and re-evaluation of patient's condition.         Final Clinical Impression(s) / ED Diagnoses Final diagnoses:  None    Rx / DC Orders ED Discharge Orders     None         Harry Bark, Gwenyth Allegra, MD 05/13/22 [phone]

## 2022-05-13 NOTE — ED Notes (Signed)
Patient transported to MRI 

## 2022-05-13 NOTE — Progress Notes (Signed)
Called to see if I could do EEG, per [place] Pt just got to the room call back in 15-20 and see if RN is avail

## 2022-05-13 NOTE — ED Notes (Signed)
[facility] called to come get patient

## 2022-05-13 NOTE — H&P (Signed)
History and Physical    April Saunders WPY:[id number] DOB: 28-Jan-1956 DOA: 05/13/2022  PCP: Willene Hatchet, NP  Patient coming from: home I have personally briefly reviewed patient's old medical records in [place]  Chief Complaintwith generalized weakness/ gait instability 2-3 weeks  HPI: April Saunders is a 66 y.o. female with medical history significant of  HLD, HTN , DJD ,  obesity, who as had interim history of fatigue weakness 3-4 weeks with progression of generalized weakness and difficulty with balance with fall over the last 1 week. Of note patient was seen in ED 6/24 for similar complaints, at that time she was found to have uti , however there was concern with patient persistent symptoms over the last 3-4 weeks that she may have occult malignancy. Patient was discharged to follow with pcp for further evaluation. Patient now returns with progressive symptoms and falls at home progressive over the last week. Patient currently denies cp, n/v/d/ sob/ but per daughter she had a HA earlier but currently denies.   ED Course:  IN ED patient was evaluated and found to have left upper extremity deficit unclear if related to shoulder discussion vs UMN deficit. Work however revealed a tumor  right deep gray matter with extensive vasogenic edema and possible lesion in the right cerebellum. Case was discussed with Dr. Glenford Peers, on-call for neurosurgery. Per his recommendations patient was treated with Decadron 10 mg IV followed by Decadron 4 mg every 6 hours, Keppra 500 mg twice daily with plans for  transfer to [facility] for further neurosurgery evaluation.   Vitals: Afeb, bp131/75, hr 81, rr 16 sat 1005 on ra  Labs: wbc 7.1, hgb 13.3, plt 266,  Na 141, K 4, glu 126,  t-bil 1.7, cr 0.88 Ua;neg  CT head 1. 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 2.5 cm hypercellular appearing mass centered at the right deep gray  nuclei. Questionable 2nd lesion in the right cerebellum. Recommend MRI Head without and with contrast to further characterize.   2. Intracranial mass effect with a faced suprasellar cistern, right lateral ventricle, 7 mm of leftward midline shift and early trapping of the left lateral ventricle Tx as noted above in addition 500 ns bolusx1  MRI brain 1. Multifocal, but also somewhat infiltrative and hypercellular enhancing brain masses involving both cerebral hemispheres, the brainstem, and the right cerebellum. Favor CNS Lymphoma. Multicentric high-grade Glioma and metastatic disease are possible but felt less likely.   2. Extensive associated brain and brainstem edema. Intracranial mass effect with leftward midline shift of 7 mm.   3. Recommend Neuro-Oncology and Neurosurgery consultation  Review of Systems: As per HPI otherwise 10 point review of systems negative.   Past Medical History:  Diagnosis Date   Abnormal MRI    brain   DJD (degenerative joint disease)    multiple joints   Hyperlipidemia    Hypertension    Lipoma    rt ankle   Obesity    Trigeminal neuralgia 07/05/2021    Past Surgical History:  Procedure Laterality Date   ABDOMINAL HYSTERECTOMY     ? ovaries   COLONOSCOPY       reports that she has quit smoking. Her smoking use included cigarettes. She has never used smokeless tobacco. She reports that she does not drink alcohol and does not use drugs.  Allergies  Allergen Reactions   Atorvastatin Calcium Itching   Lipitor [Atorvastatin]     Weakness  Lisinopril Other (See Comments)    Shoulder pain    Family History  Problem Relation Age of Onset   Diabetes Mellitus I Mother    Hypertension Brother    Diabetes Mellitus I Brother    Esophageal cancer Brother    Colon cancer Neg Hx    Stomach cancer Neg Hx    Rectal cancer Neg Hx     Prior to Admission medications   Medication Sig Start Date End Date Taking? Authorizing Provider   Acetaminophen (TYLENOL PO) Take 500 mg by mouth in the morning, at noon, in the evening, and at bedtime.   Yes [provider]  amLODipine (NORVASC) 5 MG tablet Take 1 tablet (5 mg total) by mouth daily. 08/28/21 05/13/22 Yes Sagardia, Ines Bloomer, MD  carbamazepine (TEGRETOL) 200 MG tablet Take 1 tablet (200 mg total) by mouth 2 (two) times daily. 07/05/21 05/13/22 Yes Sagardia, Ines Bloomer, MD  cephALEXin (KEFLEX) 500 MG capsule Take 1 capsule (500 mg total) by mouth 4 (four) times daily. 05/05/22  Yes Redwine, Madison A, PA-C  potassium chloride SA (KLOR-CON M) 20 MEQ tablet Take 1 tablet (20 mEq total) by mouth daily. 05/05/22  Yes Redwine, Madison A, PA-C  simvastatin (ZOCOR) 20 MG tablet Take 1 tablet (20 mg total) by mouth daily. 07/05/21  Yes Sagardia, Ines Bloomer, MD  Cholecalciferol (VITAMIN D-3) 125 MCG (5000 UT) TABS Take 1 tablet by mouth daily. 04/08/19   Hilts, Michael, MD  [facility] Liver Oil CAPS Take 1 capsule by mouth daily.    [provider]  fish oil-omega-3 fatty acids 1000 MG capsule Take 1 g by mouth daily.    [provider]  hydrochlorothiazide (HYDRODIURIL) 12.5 MG tablet Take 1 tablet (12.5 mg total) by mouth daily. 07/05/21   Horald Pollen, MD  Vitamin D, Ergocalciferol, (DRISDOL) 1.25 MG (50000 UNIT) CAPS capsule Take 50,000 Units by mouth once a week. 03/16/22   [provider]    Physical Exam: Vitals:   05/13/22 0330 05/13/22 0450 05/13/22 0530 05/13/22 0730  BP: (!) 109/93 101/61 108/63 99/61  Pulse: 84 67 73 64  Resp: '16 17 15   '$ Temp:      TempSrc:      SpO2: 100% 100% 98% 97%  Weight:      Height:         Vitals:   05/13/22 0330 05/13/22 0450 05/13/22 0530 05/13/22 0730  BP: (!) 109/93 101/61 108/63 99/61  Pulse: 84 67 73 64  Resp: '16 17 15   '$ Temp:      TempSrc:      SpO2: 100% 100% 98% 97%  Weight:      Height:      Constitutional: NAD, calm, comfortable lethargic but arousable Eyes: PERRL, lids and conjunctivae  normal ENMT: Mucous membranes are dry Posterior pharynx clear of any exudate or lesions.Normal dentition.  Neck: normal, supple, no masses, no thyromegaly Respiratory: clear to auscultation bilaterally, no wheezing, no crackles. Normal respiratory effort. No accessory muscle use.  Cardiovascular: Regular rate and rhythm, no murmurs / rubs / gallops. No extremity edema. 2+ pedal pulses.  Abdomen: no tenderness, no masses palpated. No hepatosplenomegaly. Bowel sounds positive.  Musculoskeletal: no clubbing / cyanosis. No joint deformity upper and lower extremities. Good ROM, no contractures. Normal muscle tone.  Skin: no rashes, lesions, ulcers. No induration Neurologic: CN 2-12 grossly intact. Sensation intact,  Strength 5/5 in all right  Left uppe 3/5 left lower 3+-4/5.  Psychiatric: Normal judgment and  insight. Alert and oriented x 3. Normal mood.    Labs on Admission: I have personally reviewed following labs and imaging studies  CBC: Recent Labs  Lab 05/13/22 0017  WBC 7.1  NEUTROABS 4.6  HGB 13.3  HCT 41.2  MCV 82.4  PLT 115   Basic Metabolic Panel: Recent Labs  Lab 05/13/22 0017  NA 141  K 4.0  CL 106  CO2 22  GLUCOSE 126*  BUN 14  CREATININE 0.88  CALCIUM 10.6*   GFR: Estimated Creatinine Clearance: 57.6 mL/min (by C-G formula based on SCr of 0.88 mg/dL). Liver Function Tests: Recent Labs  Lab 05/13/22 0017  AST 27  ALT 22  ALKPHOS 51  BILITOT 1.7*  PROT 7.9  ALBUMIN 4.5   No results for input(s): "LIPASE", "AMYLASE" in the last 168 hours. No results for input(s): "AMMONIA" in the last 168 hours. Coagulation Profile: No results for input(s): "INR", "PROTIME" in the last 168 hours. Cardiac Enzymes: No results for input(s): "CKTOTAL", "CKMB", "CKMBINDEX", "TROPONINI" in the last 168 hours. BNP (last 3 results) No results for input(s): "PROBNP" in the last 8760 hours. HbA1C: No results for input(s): "HGBA1C" in the last 72 hours. CBG: No results for  input(s): "GLUCAP" in the last 168 hours. Lipid Profile: No results for input(s): "CHOL", "HDL", "LDLCALC", "TRIG", "CHOLHDL", "LDLDIRECT" in the last 72 hours. Thyroid Function Tests: No results for input(s): "TSH", "T4TOTAL", "FREET4", "T3FREE", "THYROIDAB" in the last 72 hours. Anemia Panel: No results for input(s): "VITAMINB12", "FOLATE", "FERRITIN", "TIBC", "IRON", "RETICCTPCT" in the last 72 hours. Urine analysis:    Component Value Date/Time   COLORURINE YELLOW 05/13/2022 0314   APPEARANCEUR CLEAR 05/13/2022 0314   LABSPEC 1.021 05/13/2022 0314   PHURINE 5.0 05/13/2022 0314   GLUCOSEU NEGATIVE 05/13/2022 0314   HGBUR NEGATIVE 05/13/2022 0314   BILIRUBINUR NEGATIVE 05/13/2022 0314   BILIRUBINUR neg 06/14/2013 1028   KETONESUR 5 (A) 05/13/2022 0314   PROTEINUR NEGATIVE 05/13/2022 0314   UROBILINOGEN 0.2 06/14/2013 1028   NITRITE NEGATIVE 05/13/2022 0314   LEUKOCYTESUR NEGATIVE 05/13/2022 0314    Radiological Exams on Admission: MR Brain W and Wo Contrast  Result Date: 05/13/2022 CLINICAL DATA:  66 year old female with altered mental status and evidence of 2.5 cm right hemisphere mass with vasogenic edema on noncontrast head CT. EXAM: MRI HEAD WITHOUT AND WITH CONTRAST TECHNIQUE: Multiplanar, multiecho pulse sequences of the brain and surrounding structures were obtained without and with intravenous contrast. CONTRAST:  59m GADAVIST GADOBUTROL 1 MMOL/ML IV SOLN COMPARISON:  Noncontrast head CT 0408 hours today. FINDINGS: Study is intermittently degraded by motion artifact despite repeated imaging attempts. Brain: In the right hemisphere centered at the basal ganglia an oval T2 dark mass with conspicuous decreased diffusion (series 6, image 21) is homogeneously enhancing and approximately 2.6 cm long axis. Confluent surrounding T2 and FLAIR hyperintensity in a vasogenic edema pattern. But there is also evidence of nearby abnormal petechial enhancement in the right corona radiata border  in the corpus callosum (series 17, image 15). Diffusion might be abnormal in the body of the corpus callosum nearby (series 5, image 79). And in the left temporal lobe there is 17 mm area of rounded masslike enhancement which also has abnormal diffusion and abuts the ventricle there (series 16, image 17). Furthermore, there is evidence of abnormal enhancement in the dorsal brainstem on series 16, image 12 with confluent asymmetric brainstem edema (series 10, images 12 and 14). And lastly, there is an 11 mm enhancing oval mass in  the central right cerebellum with restricted diffusion and regional cerebellar edema. No areas of dural thickening identified. Intracranial mass effect with leftward midline shift of 7 mm at the septum pellucidum. Effaced right lateral ventricle. No ventriculomegaly, intraventricular debris, definite transependymal edema, or ependymal enhancement. No acute intracranial hemorrhage identified. No restricted diffusion suggestive of acute infarction. Pituitary poorly visualized. Cervicomedullary junction appears to remain normal. Vascular: Major intracranial vascular flow voids are grossly preserved. Skull and upper cervical spine: Not well evaluated due to motion. Sinuses/Orbits: Negative. Other: Visible scalp and face appear negative. IMPRESSION: 1. Multifocal, but also somewhat infiltrative and hypercellular enhancing brain masses involving both cerebral hemispheres, the brainstem, and the right cerebellum. Favor CNS Lymphoma. Multicentric high-grade Glioma and metastatic disease are possible but felt less likely. 2. Extensive associated brain and brainstem edema. Intracranial mass effect with leftward midline shift of 7 mm. 3. Recommend Neuro-Oncology and Neurosurgery consultation. Electronically Signed   By: Genevie Ann M.D.   On: 05/13/2022 09:20   CT HEAD WO CONTRAST (5MM)  Addendum Date: 05/13/2022   ADDENDUM REPORT: 05/13/2022 05:39 ADDENDUM: Study discussed by telephone with Dr.  Joseph Berkshire on 05/13/2022 at 0527 hours. Electronically Signed   By: Genevie Ann M.D.   On: 05/13/2022 05:39   Result Date: 05/13/2022 CLINICAL DATA:  66 year old female turned mental status. EXAM: CT HEAD WITHOUT CONTRAST TECHNIQUE: Contiguous axial images were obtained from the base of the skull through the vertex without intravenous contrast. RADIATION DOSE REDUCTION: This exam was performed according to the departmental dose-optimization program which includes automated exposure control, adjustment of the mA and/or kV according to patient size and/or use of iterative reconstruction technique. COMPARISON:  Brain MRI 02/08/2010. FINDINGS: Brain: Round and fairly circumscribed slightly hyperdense lesion centered at the right deep gray nuclei is nearly 2.5 cm diameter on series 2, image 17. That area was unremarkable on the 2011 CT. And there is a large volume of surrounding white matter hypodensity in the right hemisphere which most resembles vasogenic edema. Regional mass effect including effaced right lateral ventricle. Leftward midline shift of 7 mm. Edema appears to track into the right midbrain, brainstem on series 5, image 42. Effaced suprasellar cistern. Other basilar cisterns are patent. Questionably also asymmetric edema in the deep right cerebellum series 2 image 10. But no 2nd mass lesion is evident. No convincing acute intracranial hemorrhage or cortically based infarct. The left occipital horn is mildly dilated, and brainstem edema does appear to affect the level of the cerebral aqueduct. Mild if any transependymal edema at this time. Vascular: No suspicious intracranial vascular hyperdensity. Skull: No acute or suspicious osseous lesion identified. Sinuses/Orbits: Visualized paranasal sinuses and mastoids are clear. Other: Visualized orbits and scalp soft tissues are within normal limits. IMPRESSION: 1. Abnormal brain with noncontrast CT constellation most suggestive of brain mass with extensive  vasogenic edema. 2.5 cm hypercellular appearing mass centered at the right deep gray nuclei. Questionable 2nd lesion in the right cerebellum. Recommend MRI Head without and with contrast to further characterize. 2. 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 Electronically Signed: By: Genevie Ann M.D. On: 05/13/2022 05:10    EKG: Independently reviewed. 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 -decadron 4 mg every 6 hours - Keppra 500 mg twice daily  -pending transfer  to [facility] -neuro checks/sz precautions   HLD -continue statin     HTN  -borderline , hold home regimen    DJD  -no acute issues     DVT prophylaxis: scd Code Status: full Family Communication:  Disposition Plan: patient  expected to be admitted greater than 2 midnights  Consults called: see above neurosurgery Admission status: progressive   Clance Boll MD [facility]  If 7PM-7AM, please contact night-coverage [email or web address] Password [facility]  05/13/2022, 10:22 AM

## 2022-05-13 NOTE — Procedures (Signed)
Patient Name: April Saunders  MRN: [id number]  Epilepsy Attending: Lora Havens  Referring Physician/Provider: Clance Boll, MD Date: 05/13/2022  Duration: 22.11 mins  Patient history: 66yo F with multifocal brain tumors with associated vasogenic edema and left sided weakness. EEG to evaluate for seizure  Level of alertness: Awake  AEDs during EEG study: LEV  Technical aspects: This EEG study was done with scalp electrodes positioned according to the 10-20 International system of electrode placement. Electrical activity was acquired at a sampling rate of '500Hz'$  and reviewed with a high frequency filter of '70Hz'$  and a low frequency filter of '1Hz'$ . EEG data were recorded continuously and digitally stored.   Description: The posterior dominant rhythm consists of 8-9 Hz activity of moderate voltage (25-35 uV) seen predominantly in posterior head regions, symmetric and reactive to eye opening and eye closing. EEG showed continuous generalized and lateralized right hemisphere polymorphic sharply contoured 3 to 6 Hz theta-delta slowing admixed with 15 to 18 Hz, 2-3 uV beta activity in left hemisphere. Hyperventilation and photic stimulation were not performed.     ABNORMALITY - Continuous slow, generalized and lateralized right hemisphere   IMPRESSION: This study is suggestive of cortical dysfunction arising from right hemisphere likely secondary to underlying structural abnormality. Additionally there is mild to moderate diffuse encephalopathy, nonspecific etiology. No seizures or epileptiform discharges were seen throughout the recording.  Lucian Baswell Barbra Sarks

## 2022-05-13 NOTE — ED Notes (Signed)
Patient attempted sitting on the toilet to provide a urine sample. Unsuccessful.

## 2022-05-13 NOTE — ED Triage Notes (Signed)
Pt reports with increased weakness over the past 3 weeks. Pt's family reports that she has had several falls.

## 2022-05-13 NOTE — Consult Note (Signed)
Reason for Consult:Brain mass Referring Physician: Orpah Greek, MD   HPI: April Saunders is a 66 y.o. female with a PmHx history significant fpr HLD, HTN , DJD, obesity, who presented to the [facility] with 3-4 week history of progressively worsening headaches, fatigue and generalized weakness with onset of balance difficulties over the last week. She had similar complaints recently and presented to the ED on 6/24. At that time, she was diagnosed with a UTI and discharged. Due to the persistent symptoms despite treatment of the UTI, she represented for evaluation. Work up in the ED revealed a mass centered at the right deep gray nuclei. NSX consult was requested due to the findings on the CT head. It was recommended for the patient be transferred to [facility] for admission and [place] consultation.   Past Medical History:  Diagnosis Date   Abnormal MRI    brain   DJD (degenerative joint disease)    multiple joints   Hyperlipidemia    Hypertension    Lipoma    rt ankle   Obesity    Trigeminal neuralgia 07/05/2021    Past Surgical History:  Procedure Laterality Date   ABDOMINAL HYSTERECTOMY     ? ovaries   COLONOSCOPY      Family History  Problem Relation Age of Onset   Diabetes Mellitus I Mother    Hypertension Brother    Diabetes Mellitus I Brother    Esophageal cancer Brother    Colon cancer Neg Hx    Stomach cancer Neg Hx    Rectal cancer Neg Hx     Social History:  reports that she has quit smoking. Her smoking use included cigarettes. She has never used smokeless tobacco. She reports that she does not drink alcohol and does not use drugs.  Allergies:  Allergies  Allergen Reactions   Atorvastatin Calcium Itching   Lipitor [Atorvastatin]     Weakness    Lisinopril Other (See Comments)    Shoulder pain    Medications: I have reviewed the patient's current medications.  Results for orders placed or performed during the hospital encounter of 05/13/22 (from the past 48  hour(s))  CBC with Differential     Status: None   Collection Time: 05/13/22 12:17 AM  Result Value Ref Range   WBC 7.1 4.0 - 10.5 K/uL   RBC 5.00 3.87 - 5.11 MIL/uL   Hemoglobin 13.3 12.0 - 15.0 g/dL   HCT 41.2 36.0 - 46.0 %   MCV 82.4 80.0 - 100.0 fL   MCH 26.6 26.0 - 34.0 pg   MCHC 32.3 30.0 - 36.0 g/dL   RDW 14.2 11.5 - 15.5 %   Platelets 266 150 - 400 K/uL   nRBC 0.0 0.0 - 0.2 %   Neutrophils Relative % 65 %   Neutro Abs 4.6 1.7 - 7.7 K/uL   Lymphocytes Relative 26 %   Lymphs Abs 1.8 0.7 - 4.0 K/uL   Monocytes Relative 7 %   Monocytes Absolute 0.5 0.1 - 1.0 K/uL   Eosinophils Relative 1 %   Eosinophils Absolute 0.1 0.0 - 0.5 K/uL   Basophils Relative 1 %   Basophils Absolute 0.0 0.0 - 0.1 K/uL   Immature Granulocytes 0 %   Abs Immature Granulocytes 0.01 0.00 - 0.07 K/uL    Comment: Performed at [facility] [place] [address] [place], [place] [address]  Comprehensive metabolic panel     Status: Abnormal   Collection Time: 05/13/22 12:17 AM  Result Value  Ref Range   Sodium 141 135 - 145 mmol/L   Potassium 4.0 3.5 - 5.1 mmol/L   Chloride 106 98 - 111 mmol/L   CO2 22 22 - 32 mmol/L   Glucose, Bld 126 (H) 70 - 99 mg/dL    Comment: Glucose reference range applies only to samples taken after fasting for at least 8 hours.   BUN 14 8 - 23 mg/dL   Creatinine, Ser 0.88 0.44 - 1.00 mg/dL   Calcium 10.6 (H) 8.9 - 10.3 mg/dL   Total Protein 7.9 6.5 - 8.1 g/dL   Albumin 4.5 3.5 - 5.0 g/dL   AST 27 15 - 41 U/L   ALT 22 0 - 44 U/L   Alkaline Phosphatase 51 38 - 126 U/L   Total Bilirubin 1.7 (H) 0.3 - 1.2 mg/dL   GFR, Estimated >60 >60 mL/min    Comment: (NOTE) Calculated using the CKD-EPI Creatinine Equation (2021)    Anion gap 13 5 - 15    Comment: Performed at [facility], [place] [address], [place], [place] [address]  Urinalysis, Routine w reflex microscopic Urine, In & Out Cath     Status: Abnormal   Collection Time: 05/13/22  3:14 AM   Result Value Ref Range   Color, Urine YELLOW YELLOW   APPearance CLEAR CLEAR   Specific Gravity, Urine 1.021 1.005 - 1.030   pH 5.0 5.0 - 8.0   Glucose, UA NEGATIVE NEGATIVE mg/dL   Hgb urine dipstick NEGATIVE NEGATIVE   Bilirubin Urine NEGATIVE NEGATIVE   Ketones, ur 5 (A) NEGATIVE mg/dL   Protein, ur NEGATIVE NEGATIVE mg/dL   Nitrite NEGATIVE NEGATIVE   Leukocytes,Ua NEGATIVE NEGATIVE    Comment: Performed at [facility] [address] [place], [place] [address]    CT HEAD WO CONTRAST (5MM)  Addendum Date: 05/13/2022   ADDENDUM REPORT: 05/13/2022 05:39 ADDENDUM: Study discussed by telephone with Dr. Joseph Berkshire on 05/13/2022 at 0527 hours. Electronically Signed   By: Genevie Ann M.D.   On: 05/13/2022 05:39   Result Date: 05/13/2022 CLINICAL DATA:  66 year old female turned mental status. EXAM: CT HEAD WITHOUT CONTRAST TECHNIQUE: Contiguous axial images were obtained from the base of the skull through the vertex without intravenous contrast. RADIATION DOSE REDUCTION: This exam was performed according to the departmental dose-optimization program which includes automated exposure control, adjustment of the mA and/or kV according to patient size and/or use of iterative reconstruction technique. COMPARISON:  Brain MRI 02/08/2010. FINDINGS: Brain: Round and fairly circumscribed slightly hyperdense lesion centered at the right deep gray nuclei is nearly 2.5 cm diameter on series 2, image 17. That area was unremarkable on the 2011 CT. And there is a large volume of surrounding white matter hypodensity in the right hemisphere which most resembles vasogenic edema. Regional mass effect including effaced right lateral ventricle. Leftward midline shift of 7 mm. Edema appears to track into the right midbrain, brainstem on series 5, image 42. Effaced suprasellar cistern. Other basilar cisterns are patent. Questionably also asymmetric edema in the deep right cerebellum series 2 image  10. But no 2nd mass lesion is evident. No convincing acute intracranial hemorrhage or cortically based infarct. The left occipital horn is mildly dilated, and brainstem edema does appear to affect the level of the cerebral aqueduct. Mild if any transependymal edema at this time. Vascular: No suspicious intracranial vascular hyperdensity. Skull: No acute or suspicious osseous lesion identified. Sinuses/Orbits: Visualized paranasal sinuses and mastoids are clear. Other: Visualized orbits  and scalp soft tissues are within normal limits. IMPRESSION: 1. 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 2.5 cm hypercellular appearing mass centered at the right deep gray nuclei. Questionable 2nd lesion in the right cerebellum. Recommend MRI Head without and with contrast to further characterize. 2. 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 Electronically Signed: By: Genevie Ann M.D. On: 05/13/2022 05:10    ROS: Per HPI Blood pressure 108/63, pulse 73, temperature 98.9 F (37.2 C), temperature source Oral, resp. rate 15, height '5\' 3"'$  (1.6 m), weight 64.4 kg, SpO2 98 %. Physical Exam: Patient is awake, A/O X 3, disoriented to time, and conversant. Eyes open spontaneously. They are in NAD and VSS. Speech is fluent and appropriate. MAEW. LUE 4/5, RUE 4+/5. BLE 5/5. Sensation to light touch is intact. PERLA, EOMI. CNs grossly intact.    Assessment/Plan:  66 y.o. female with recent history of progressively worsening fatigue, generalized weakness, and balance abnormalities. CT head was evaluated and revealed a 2.5 cm hypercellular mass with extensive vasogenic edema centered at the right deep gray nuclei with possible 2nd lesion in the right cerebellum. 7 mm of leftward MLS with effaced suprasellar cistern, right lateral ventricle, and early  trapping of the left lateral ventricle. Plan for patient to be transferred to [facility] for continued evaluation and workup.   -Decadron '4mg'$  IV Q6H -Keppra 500 mg BID -MRI brain with Brainlab protocol -CT CAP -Frequent neuro checks    Marvis Moeller, DNP, AGNP-C Neurosurgery Nurse Practitioner  [facility] [address], [place], [place] [address] P: [phone]    F: [phone]  05/13/2022 5:43 PM

## 2022-05-14 ENCOUNTER — Inpatient Hospital Stay (HOSPITAL_COMMUNITY): Payer: Medicare Other

## 2022-05-14 ENCOUNTER — Other Ambulatory Visit: Payer: Self-pay | Admitting: Neurosurgery

## 2022-05-14 DIAGNOSIS — E785 Hyperlipidemia, unspecified: Secondary | ICD-10-CM | POA: Diagnosis not present

## 2022-05-14 DIAGNOSIS — I10 Essential (primary) hypertension: Secondary | ICD-10-CM

## 2022-05-14 DIAGNOSIS — D496 Neoplasm of unspecified behavior of brain: Secondary | ICD-10-CM | POA: Diagnosis not present

## 2022-05-14 LAB — GLUCOSE, CAPILLARY
Glucose-Capillary: 132 mg/dL — ABNORMAL HIGH (ref 70–99)
Glucose-Capillary: 134 mg/dL — ABNORMAL HIGH (ref 70–99)
Glucose-Capillary: 91 mg/dL (ref 70–99)

## 2022-05-14 LAB — HIV ANTIBODY (ROUTINE TESTING W REFLEX): HIV Screen 4th Generation wRfx: NONREACTIVE

## 2022-05-14 MED ORDER — IOHEXOL 300 MG/ML  SOLN
100.0000 mL | Freq: Once | INTRAMUSCULAR | Status: AC | PRN
  Administered 2022-05-14: 90 mL via INTRAVENOUS

## 2022-05-14 NOTE — Progress Notes (Addendum)
Pt seen and examined.  66 yo F with progressive weakness balance difficulty over past several weeks.  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  She will need CT chest/abd/pelvis for metastatic workup.  If this is negative, she will require stereotactic brain biopsy for diagnosis, likely on 05/14/22. She will also likely need ophthalmologic slit lamp exam.  HIV test was nonreactive.  I discussed this with the patient who did verbalize some limited understanding of the situation.  I called the patient's daughter April Saunders and discussed the general technique of surgery.  I discussed risk, benefits, alternatives, and expected convalescence.  Risk discussed included, but were not limited to, bleeding, pain, infection, scar, neurologic deficit, nondiagnostic tissue, coma, and death.  Informed consent was obtained.

## 2022-05-14 NOTE — Progress Notes (Addendum)
PROGRESS NOTE    April Saunders  HLK:[id number] DOB: [birth year] DOA: 05/13/2022 PCP: Willene Hatchet, NP    Brief Narrative:   April Saunders is a 66 y.o. female with medical history significant of hypertension, hyperlipidemia, degenerative joint disease, obesity presented hospital with fatigue, weakness for 3 to 4 weeks with difficulty balancing himself  and falls.  In the ED, patient had left upper extremity deficit.  Further work-up showed tumor in the right deep gray matter with extensive vasogenic edema and possible lesion in the right cerebellum. Case was discussed with  neurosurgery.  Patient was then initiated on dexamethasone and Keppra and was admitted to the hospital for further evaluation and treatment.   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  MRI shows multifocal somewhat infiltrative and hypercellular enhancing brain masses involving both cerebral hemispheres and brainstem and citrate cerebellum.  Possibility of CNS lymphoma.  Extensive brain and brainstem edema with midline shift of 7 mm was reported..  Continue Decadron and Keppra.  Follow neurosurgery recommendations.   Hyperlipidemia. Continue statins.  Essential hypertension. Continue to hold antihypertensives for now.  Blood pressure marginally low at this time.     DVT prophylaxis: SCDs Start: 05/13/22 1051   Code Status:     Code Status: Full Code  Disposition: Unknown at this time  Status is: Inpatient  Remains inpatient appropriate because: Neurosurgical evaluation, intracranial mass   Family Communication:  Spoke with the patient's daughter Ms Claiborne Billings on the phone and updated her about the clinical condition of the patient.  Consultants:  Neurosurgery  Procedures:  None  Antimicrobials:  None  Anti-infectives (From  admission, onward)    None      Subjective: Today, patient was seen and examined at bedside.  Patient denies nausea vomiting headache and has weakness.  Appears to be confused at times.  Oriented to place.  Objective: Vitals:   05/13/22 1944 05/14/22 0357 05/14/22 0734 05/14/22 0737  BP: (!) 150/73 137/71 97/83   Pulse: 65 61 (!) 110 100  Resp: 18 19 (!) 22 20  Temp: 98.2 F (36.8 C) 97.8 F (36.6 C) 97.9 F (36.6 C)   TempSrc: Oral Oral    SpO2: 100% 100% (!) 87% 90%  Weight:      Height:        Intake/Output Summary (Last 24 hours) at 05/14/2022 1134 Last data filed at 05/14/2022 0604 Gross per 24 hour  Intake 1200 ml  Output 2280 ml  Net -1080 ml   Filed Weights   05/13/22 0011  Weight: 64.4 kg    Physical Examination: Body mass index is 25.15 kg/m.  General:  Average built, not in obvious distress HENT:   No scleral pallor or icterus noted. Oral mucosa is moist.  Chest:  Clear breath sounds.  Diminished breath sounds bilaterally. No crackles or wheezes.  CVS: S1 &S2 heard. No murmur.  Regular rate and rhythm. Abdomen: Soft, nontender, nondistended.  Bowel sounds are heard.   Extremities: No cyanosis, clubbing or edema.  Peripheral pulses are palpable. Psych: Alert, awake, oriented to place, disoriented to month.  Appears confused at times. CNS: Left upper extremity weakness noted. Skin: Warm and dry.  No rashes noted.  Data Reviewed:   CBC: Recent Labs  Lab 05/13/22 0017  WBC 7.1  NEUTROABS 4.6  HGB 13.3  HCT 41.2  MCV 82.4  PLT 188    Basic Metabolic Panel: Recent Labs  Lab 2022/06/10 0017  NA 141  K 4.0  CL 106  CO2 22  GLUCOSE 126*  BUN 14  CREATININE 0.88  CALCIUM 10.6*    Liver Function Tests: Recent Labs  Lab 10-Jun-2022 0017  AST 27  ALT 22  ALKPHOS 51  BILITOT 1.7*  PROT 7.9  ALBUMIN 4.5     Radiology Studies: EEG adult  Result Date: Jun 10, 2022 Lora Havens, MD     2022-06-10 10:25 PM Patient Name: April Saunders MRN:  [id number] Epilepsy Attending: Lora Havens Referring Physician/Provider: Clance Boll, MD Date: 06-10-2022 Duration: 22.11 mins Patient history: 66yo F with multifocal brain tumors with associated vasogenic edema and left sided weakness. EEG to evaluate for seizure Level of alertness: Awake AEDs during EEG study: LEV Technical aspects: This EEG study was done with scalp electrodes positioned according to the 10-20 International system of electrode placement. Electrical activity was acquired at a sampling rate of '500Hz'$  and reviewed with a high frequency filter of '70Hz'$  and a low frequency filter of '1Hz'$ . EEG data were recorded continuously and digitally stored. Description: The posterior dominant rhythm consists of 8-9 Hz activity of moderate voltage (25-35 uV) seen predominantly in posterior head regions, symmetric and reactive to eye opening and eye closing. EEG showed continuous generalized and lateralized right hemisphere polymorphic sharply contoured 3 to 6 Hz theta-delta slowing admixed with 15 to 18 Hz, 2-3 uV beta activity in left hemisphere. Hyperventilation and photic stimulation were not performed.   ABNORMALITY - Continuous slow, generalized and lateralized right hemisphere IMPRESSION: This study is suggestive of cortical dysfunction arising from right hemisphere likely secondary to underlying structural abnormality. Additionally there is mild to moderate diffuse encephalopathy, nonspecific etiology. No seizures or epileptiform discharges were seen throughout the recording. Lora Havens   MR Brain W and Wo Contrast  Result Date: 2022/06/10 CLINICAL DATA:  66 year old female with altered mental status and evidence of 2.5 cm right hemisphere mass with vasogenic edema on noncontrast head CT. EXAM: MRI HEAD WITHOUT AND WITH CONTRAST TECHNIQUE: Multiplanar, multiecho pulse sequences of the brain and surrounding structures were obtained without and with intravenous contrast. CONTRAST:  72m  GADAVIST GADOBUTROL 1 MMOL/ML IV SOLN COMPARISON:  Noncontrast head CT 0408 hours today. FINDINGS: Study is intermittently degraded by motion artifact despite repeated imaging attempts. Brain: In the right hemisphere centered at the basal ganglia an oval T2 dark mass with conspicuous decreased diffusion (series 6, image 21) is homogeneously enhancing and approximately 2.6 cm long axis. Confluent surrounding T2 and FLAIR hyperintensity in a vasogenic edema pattern. But there is also evidence of nearby abnormal petechial enhancement in the right corona radiata border in the corpus callosum (series 17, image 15). Diffusion might be abnormal in the body of the corpus callosum nearby (series 5, image 79). And in the left temporal lobe there is 17 mm area of rounded masslike enhancement which also has abnormal diffusion and abuts the ventricle there (series 16, image 17). Furthermore, there is evidence of abnormal enhancement in the dorsal brainstem on series 16, image 12 with confluent asymmetric brainstem edema (series 10, images 12 and 14). And lastly, there is an 11 mm enhancing oval mass in the central right cerebellum with restricted diffusion and regional cerebellar edema. No areas of dural thickening identified. Intracranial mass effect with leftward midline shift of 7 mm at the septum pellucidum. Effaced right lateral ventricle. No ventriculomegaly, intraventricular debris,  definite transependymal edema, or ependymal enhancement. No acute intracranial hemorrhage identified. No restricted diffusion suggestive of acute infarction. Pituitary poorly visualized. Cervicomedullary junction appears to remain normal. Vascular: Major intracranial vascular flow voids are grossly preserved. Skull and upper cervical spine: Not well evaluated due to motion. Sinuses/Orbits: Negative. Other: Visible scalp and face appear negative. IMPRESSION: 1. Multifocal, but also somewhat infiltrative and hypercellular enhancing brain masses  involving both cerebral hemispheres, the brainstem, and the right cerebellum. Favor CNS Lymphoma. Multicentric high-grade Glioma and metastatic disease are possible but felt less likely. 2. Extensive associated brain and brainstem edema. Intracranial mass effect with leftward midline shift of 7 mm. 3. Recommend Neuro-Oncology and Neurosurgery consultation. Electronically Signed   By: Genevie Ann M.D.   On: 05/13/2022 09:20   CT HEAD WO CONTRAST (5MM)  Addendum Date: 05/13/2022   ADDENDUM REPORT: 05/13/2022 05:39 ADDENDUM: Study discussed by telephone with Dr. Joseph Berkshire on 05/13/2022 at 0527 hours. Electronically Signed   By: Genevie Ann M.D.   On: 05/13/2022 05:39   Result Date: 05/13/2022 CLINICAL DATA:  66 year old female turned mental status. EXAM: CT HEAD WITHOUT CONTRAST TECHNIQUE: Contiguous axial images were obtained from the base of the skull through the vertex without intravenous contrast. RADIATION DOSE REDUCTION: This exam was performed according to the departmental dose-optimization program which includes automated exposure control, adjustment of the mA and/or kV according to patient size and/or use of iterative reconstruction technique. COMPARISON:  Brain MRI 02/08/2010. FINDINGS: Brain: Round and fairly circumscribed slightly hyperdense lesion centered at the right deep gray nuclei is nearly 2.5 cm diameter on series 2, image 17. That area was unremarkable on the 2011 CT. And there is a large volume of surrounding white matter hypodensity in the right hemisphere which most resembles vasogenic edema. Regional mass effect including effaced right lateral ventricle. Leftward midline shift of 7 mm. Edema appears to track into the right midbrain, brainstem on series 5, image 42. Effaced suprasellar cistern. Other basilar cisterns are patent. Questionably also asymmetric edema in the deep right cerebellum series 2 image 10. But no 2nd mass lesion is evident. No convincing acute intracranial hemorrhage or  cortically based infarct. The left occipital horn is mildly dilated, and brainstem edema does appear to affect the level of the cerebral aqueduct. Mild if any transependymal edema at this time. Vascular: No suspicious intracranial vascular hyperdensity. Skull: No acute or suspicious osseous lesion identified. Sinuses/Orbits: Visualized paranasal sinuses and mastoids are clear. Other: Visualized orbits and scalp soft tissues are within normal limits. IMPRESSION: 1. 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 2.5 cm hypercellular appearing mass centered at the right deep gray nuclei. Questionable 2nd lesion in the right cerebellum. Recommend MRI Head without and with contrast to further characterize. 2. 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 Electronically Signed: By: Genevie Ann M.D. On: 05/13/2022 05:10      LOS: 1 day    Flora Lipps, MD [facility] Available via Epic secure chat 7am-7pm After these hours, please refer to coverage provider listed on [email or web address] 05/14/2022, 11:34 AM

## 2022-05-14 NOTE — Hospital Course (Addendum)
April Saunders is a 66 y.o. female with medical history significant of hypertension, hyperlipidemia, degenerative joint disease, obesity presented hospital with fatigue, weakness for 3 to 4 weeks with difficulty balancing himself  and falls.  In the ED, patient had left upper extremity deficit.  Further work-up in the ED showed tumor in the right deep gray matter with extensive vasogenic edema and possible lesion in the right cerebellum. Case was discussed with  neurosurgery.  Patient was then initiated on dexamethasone and Keppra and was admitted to the hospital for further evaluation and treatment.   During hospitalization, patient has been followed by neurosurgery.  CT scan of the brain brain protocol showed enhancing lesions in the brain.  CT scan of the chest abdomen and pelvis done 05/15/2022 without any evidence of lymphadenopathy.  She underwent a stereotactic brain biopsy with Dr. Marcello Moores on 05/16/2022 and CT of the head was done yesterday due to increased lethargy which was unremarkable.  Overall she is currently stable though her prognosis is extremely poor.  Neurosurgery felt that her strength is slightly improved and current plan is to have neuro-oncology see the patient as well as continuing Keppra 500 g p.o. twice daily and likely planning for inpatient rehab if she is a candidate.  Despite aggressive steroid dosing she continues to have periods of lethargy, confusion and hemiparesis and she is not able to stand, toilet or feed herself at this time and nor oncology evaluated and given that her functional status remains poor they are recommending a palliative care consult to help further with goals of care and disposition at this time but if her clinical status improves they can consider proceeding with staging and treatment planning.  Palliative care met with the patient's family and they want to continue the current plan of care for now and family is interested in pursuing CIR with the goal of  improving patient's functional status and allowing her to begin chemotherapy versus radiation therapy.  Currently she remains a little withdrawn.  CIR evaluated and unfortunately the family is unable to provide 24/7 care after a CIR admit so now recommendation is for SNF. TOC assisting with Disposition.   Assessment and plan Principal Problem:   Brain tumor [facility]) Active Problems:   Hypertension   Dyslipidemia   Hypokalemia   Multifocal Brain tumors with associated vasogenic edema and left sided weakness -Neurosurgery on board.   -Patient had presented with generalized weakness, fatigue, difficulty balancing herself as well as falls with left upper extremity deficit. -MRI shows multifocal somewhat infiltrative and hypercellular enhancing brain masses involving both cerebral hemispheres and brainstem and citrate cerebellum.  Possibility of CNS lymphoma.  -Extensive brain and brainstem edema with midline shift of 7 mm was reported.   -Continuing Dexamethasone IV 6 mg every 6 and levetiracetam 1000 mg IV every 12 as well as carbamazepine 200 mg p.o. twice daily.   -CT  brain lab protocol was then performed which showed multiple brightly enhancing mass lesions throughout the infratentorial and supratentorial brain most consistent with CNS lymphoma with vasogenic edema.   -Patient subsequently underwent stereotactic brain biopsy per neurosurgery, Dr. Marcello Moores, 05/16/2022. 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 -Repeat head CT scan without contrast on 05/22/2022 showed "Marked motion degradation. No additional complication evident following biopsy of the inferior basal ganglia lesion on the right. Maximal measurement today is 2.6 cm. The study is markedly  regarded by motion and assessment for subtle change is not  possible. Regional edema on  the right causes mass effect with right-to-left shift estimated at 7 mm, similar to the prior exam.  No visible edema in the  inferior cerebellum on the right or in the left temporal lobe." -CT scan of the chest abdomen and pelvis with no findings of lymphadenopathy to suggest lymphoma. We will follow neurosurgery recommendations. -Continue seizure precautions -She has some left-sided weakness and contractures and deficits -Neurooncology evaluated and unfortunately her status remains poor despite high-dose corticosteroids and she is not independent with ADLs at this time.  Neurooncology recommends palliative care consultation and continuing steroids and supportive care but if her clinical status improves then they can consider proceeding with staging and treatment but overall the prognosis has been discussed with the patient's daughter by the neuro oncologist. -Follow Neurosurgery and Neuro-Oncology Recc's -Palliative care has been consulted for further goals of care discussion given that her prognosis is extremely poor and that she has not made very much progress but family continues to remain interested in pursuing CIR with a goal of improving the patient's functional status to allow her to get further worked up with staging and treatment -PT and OT recommending CIR but patient unfortunately does not have 24-hour care after CIR admission still CIR has declined the patient and recommending the patient following for SNF   Hyperlipidemia. -Continue with Simvastatin 20 mg p.o. daily  Mild Hypokalemia. -Improved.  Patient's potassium is now 4.2 -Continue monitoring for the necessary -Mag level is now 2.3 -Continue to monitor and trend and repeat CMP in a.m.  Essential Hypertension. -Continue to hold antihypertensives for now.   -Continue to monitor blood pressure protocol -Patient is on HCTZ and amlodipine at home. -Last blood pressure reading was on the softer side at 100/62  History of Trigeminal Neuralgia.   -Patient takes carbamazepine at home and this has been resumed  Hyponatremia -Patient's sodium has  dropped to 134 and is slightly low again and is 133 -Continue to monitor and trend and repeat CMP in a.m. and if necessary will place on gentle IV fluids  Leukocytosis -In the setting of steroid demargination given that she is on dexamethasone -WBC went from 7.4 -> 11.3 -> 12.4 -> 12.6 -> 13.4 -> 15.8 -> 15.3 Continue to monitor for signs and symptoms of infection; no overt infection noted -Repeat CBC in a.m.  Elevated ALT -Mild.  Patient's ALT went from 43 -> 55 -> 57 and slowly trended up to 60 and is now 64 and likley in the setting of Steroids  -Continue to monitor and trend and repeat CMP in a.m.  Hypoalbuminemia -Patient's Albumin Level went from 3.2 -> 3.0 -> 3.1 -> 3.4 and today is 3.5 again today  -Continue to Monitor and Trend -Repeat CMP in the AM   Hyperglycemia -In the setting of steroid demargination -Continue monitor and trend CBGs and they have been ranging from 112-132 daily on BMP/CMP and CBGs ranging from 114-136 on fingersticks -If necessary will place on sensitive NovoLog cycle insulin

## 2022-05-14 NOTE — Plan of Care (Signed)

## 2022-05-14 NOTE — Progress Notes (Signed)
Pt went down for an MRI this morning, but would not cooperate. Administered ativan when transport arrived and she still was uncooperative. MD on call made aware.

## 2022-05-14 NOTE — Progress Notes (Addendum)
Subjective: Patient reports mild headaches. She is mildly confused this morning. No acute events overnight.   Objective: Vital signs in last 24 hours: Temp:  [97.8 F (36.6 C)-98.6 F (37 C)] 97.8 F (36.6 C) (07/03 0357) Pulse Rate:  [58-74] 61 (07/03 0357) Resp:  [16-22] 19 (07/03 0357) BP: (95-150)/(57-73) 137/71 (07/03 0357) SpO2:  [97 %-100 %] 100 % (07/03 0357)  Intake/Output from previous day: 07/02 0701 - 07/03 0700 In: 1200 [I.V.:1000; IV Piggyback:200] Out: 2280 [Urine:2280] Intake/Output this shift: No intake/output data recorded.  Physical Exam: Patient is awake, alert, oriented to self only, disoriented to time, place, and situation. She thought she was in the hospital for a UTI.  Eyes open spontaneously. NAD. VSS. Speech is fluent. MAEW. LUE 4/5, RUE 4+/5. RLE 5/5, LLE 4+/5. Sensation to light touch is intact. PERLA, EOMI. CNs grossly intact.     Lab Results: Recent Labs    05/13/22 0017  WBC 7.1  HGB 13.3  HCT 41.2  PLT 266   BMET Recent Labs    05/13/22 0017  NA 141  K 4.0  CL 106  CO2 22  GLUCOSE 126*  BUN 14  CREATININE 0.88  CALCIUM 10.6*    Studies/Results: EEG adult  Result Date: 05/13/2022 Lora Havens, MD     05/13/2022 10:25 PM Patient Name: April Saunders MRN: [id number] Epilepsy Attending: Lora Havens Referring Physician/Provider: Clance Boll, MD Date: 05/13/2022 Duration: 22.11 mins Patient history: 66yo F with multifocal brain tumors with associated vasogenic edema and left sided weakness. EEG to evaluate for seizure Level of alertness: Awake AEDs during EEG study: LEV Technical aspects: This EEG study was done with scalp electrodes positioned according to the 10-20 International system of electrode placement. Electrical activity was acquired at a sampling rate of '500Hz'$  and reviewed with a high frequency filter of '70Hz'$  and a low frequency filter of '1Hz'$ . EEG data were recorded continuously and digitally stored. Description: The  posterior dominant rhythm consists of 8-9 Hz activity of moderate voltage (25-35 uV) seen predominantly in posterior head regions, symmetric and reactive to eye opening and eye closing. EEG showed continuous generalized and lateralized right hemisphere polymorphic sharply contoured 3 to 6 Hz theta-delta slowing admixed with 15 to 18 Hz, 2-3 uV beta activity in left hemisphere. Hyperventilation and photic stimulation were not performed.   ABNORMALITY - Continuous slow, generalized and lateralized right hemisphere IMPRESSION: This study is suggestive of cortical dysfunction arising from right hemisphere likely secondary to underlying structural abnormality. Additionally there is mild to moderate diffuse encephalopathy, nonspecific etiology. No seizures or epileptiform discharges were seen throughout the recording. Lora Havens   MR Brain W and Wo Contrast  Result Date: 05/13/2022 CLINICAL DATA:  66 year old female with altered mental status and evidence of 2.5 cm right hemisphere mass with vasogenic edema on noncontrast head CT. EXAM: MRI HEAD WITHOUT AND WITH CONTRAST TECHNIQUE: Multiplanar, multiecho pulse sequences of the brain and surrounding structures were obtained without and with intravenous contrast. CONTRAST:  69m GADAVIST GADOBUTROL 1 MMOL/ML IV SOLN COMPARISON:  Noncontrast head CT 0408 hours today. FINDINGS: Study is intermittently degraded by motion artifact despite repeated imaging attempts. Brain: In the right hemisphere centered at the basal ganglia an oval T2 dark mass with conspicuous decreased diffusion (series 6, image 21) is homogeneously enhancing and approximately 2.6 cm long axis. Confluent surrounding T2 and FLAIR hyperintensity in a vasogenic edema pattern. But there is also evidence of nearby abnormal petechial enhancement in the right corona  radiata border in the corpus callosum (series 17, image 15). Diffusion might be abnormal in the body of the corpus callosum nearby (series 5,  image 79). And in the left temporal lobe there is 17 mm area of rounded masslike enhancement which also has abnormal diffusion and abuts the ventricle there (series 16, image 17). Furthermore, there is evidence of abnormal enhancement in the dorsal brainstem on series 16, image 12 with confluent asymmetric brainstem edema (series 10, images 12 and 14). And lastly, there is an 11 mm enhancing oval mass in the central right cerebellum with restricted diffusion and regional cerebellar edema. No areas of dural thickening identified. Intracranial mass effect with leftward midline shift of 7 mm at the septum pellucidum. Effaced right lateral ventricle. No ventriculomegaly, intraventricular debris, definite transependymal edema, or ependymal enhancement. No acute intracranial hemorrhage identified. No restricted diffusion suggestive of acute infarction. Pituitary poorly visualized. Cervicomedullary junction appears to remain normal. Vascular: Major intracranial vascular flow voids are grossly preserved. Skull and upper cervical spine: Not well evaluated due to motion. Sinuses/Orbits: Negative. Other: Visible scalp and face appear negative. IMPRESSION: 1. Multifocal, but also somewhat infiltrative and hypercellular enhancing brain masses involving both cerebral hemispheres, the brainstem, and the right cerebellum. Favor CNS Lymphoma. Multicentric high-grade Glioma and metastatic disease are possible but felt less likely. 2. Extensive associated brain and brainstem edema. Intracranial mass effect with leftward midline shift of 7 mm. 3. Recommend Neuro-Oncology and Neurosurgery consultation. Electronically Signed   By: Genevie Ann M.D.   On: 05/13/2022 09:20   CT HEAD WO CONTRAST (5MM)  Addendum Date: 05/13/2022   ADDENDUM REPORT: 05/13/2022 05:39 ADDENDUM: Study discussed by telephone with Dr. Joseph Berkshire on 05/13/2022 at 0527 hours. Electronically Signed   By: Genevie Ann M.D.   On: 05/13/2022 05:39   Result Date:  05/13/2022 CLINICAL DATA:  66 year old female turned mental status. EXAM: CT HEAD WITHOUT CONTRAST TECHNIQUE: Contiguous axial images were obtained from the base of the skull through the vertex without intravenous contrast. RADIATION DOSE REDUCTION: This exam was performed according to the departmental dose-optimization program which includes automated exposure control, adjustment of the mA and/or kV according to patient size and/or use of iterative reconstruction technique. COMPARISON:  Brain MRI 02/08/2010. FINDINGS: Brain: Round and fairly circumscribed slightly hyperdense lesion centered at the right deep gray nuclei is nearly 2.5 cm diameter on series 2, image 17. That area was unremarkable on the 2011 CT. And there is a large volume of surrounding white matter hypodensity in the right hemisphere which most resembles vasogenic edema. Regional mass effect including effaced right lateral ventricle. Leftward midline shift of 7 mm. Edema appears to track into the right midbrain, brainstem on series 5, image 42. Effaced suprasellar cistern. Other basilar cisterns are patent. Questionably also asymmetric edema in the deep right cerebellum series 2 image 10. But no 2nd mass lesion is evident. No convincing acute intracranial hemorrhage or cortically based infarct. The left occipital horn is mildly dilated, and brainstem edema does appear to affect the level of the cerebral aqueduct. Mild if any transependymal edema at this time. Vascular: No suspicious intracranial vascular hyperdensity. Skull: No acute or suspicious osseous lesion identified. Sinuses/Orbits: Visualized paranasal sinuses and mastoids are clear. Other: Visualized orbits and scalp soft tissues are within normal limits. IMPRESSION: 1. 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 2.5 cm hypercellular appearing mass centered at the right deep gray nuclei. Questionable 2nd lesion in the right  cerebellum. Recommend MRI Head without and with contrast to further characterize. 2. 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 Electronically Signed: By: Genevie Ann M.D. On: 05/13/2022 05:10    Assessment/Plan: 66 y.o. female who presented to the ED with c/o progressively worsening fatigue, generalized weakness, and balance abnormalities. CT head was evaluated and revealed a 2.5 cm hypercellular mass with extensive vasogenic edema centered at the right deep gray nuclei with possible 2nd lesion in the right cerebellum. 7 mm of leftward MLS with effaced suprasellar cistern, right lateral ventricle, and early trapping of the left lateral ventricle. Patient was transferred [facility] to [facility] for continued evaluation and workup. Her neurological exam reveals mild confusion this morning. Patient was unable to tolerate the MRI yesterday. We will discontinue the MRI and work on obtaining a contrasted CT head with Brainlab prorocol. Her Decadron will be stopped today.   -Discontinue Decadron -Keppra 500 mg BID -CT head with contrast with Brainlab protocol  -CT CAP -Frequent neuro checks -Plan for surgery with Dr. Marcello Moores on Wednesday   LOS: 1 day     April Moeller, DNP, AGNP-C Neurosurgery Nurse Practitioner  [facility] [place]. [address], [place], [place] [address] P: [phone]    F: [phone]  05/14/2022 7:05 AM

## 2022-05-15 ENCOUNTER — Inpatient Hospital Stay (HOSPITAL_COMMUNITY): Payer: Medicare Other

## 2022-05-15 DIAGNOSIS — E876 Hypokalemia: Secondary | ICD-10-CM | POA: Diagnosis not present

## 2022-05-15 DIAGNOSIS — D496 Neoplasm of unspecified behavior of brain: Secondary | ICD-10-CM | POA: Diagnosis not present

## 2022-05-15 DIAGNOSIS — E785 Hyperlipidemia, unspecified: Secondary | ICD-10-CM | POA: Diagnosis not present

## 2022-05-15 DIAGNOSIS — I10 Essential (primary) hypertension: Secondary | ICD-10-CM | POA: Diagnosis not present

## 2022-05-15 LAB — BASIC METABOLIC PANEL
Anion gap: 15 (ref 5–15)
BUN: 12 mg/dL (ref 8–23)
CO2: 22 mmol/L (ref 22–32)
Calcium: 9.8 mg/dL (ref 8.9–10.3)
Chloride: 105 mmol/L (ref 98–111)
Creatinine, Ser: 0.74 mg/dL (ref 0.44–1.00)
GFR, Estimated: >60 mL/min (ref >60)
Glucose, Bld: 78 mg/dL (ref 70–99)
Potassium: 3.3 mmol/L — ABNORMAL LOW (ref 3.5–5.1)
Sodium: 142 mmol/L (ref 135–145)

## 2022-05-15 LAB — GLUCOSE, CAPILLARY
Glucose-Capillary: 89 mg/dL (ref 70–99)
Glucose-Capillary: 106 mg/dL — ABNORMAL HIGH (ref 70–99)
Glucose-Capillary: 128 mg/dL — ABNORMAL HIGH (ref 70–99)

## 2022-05-15 LAB — MAGNESIUM: Magnesium: 2.2 mg/dL (ref 1.7–2.4)

## 2022-05-15 LAB — CBC
HCT: 38.2 % (ref 36.0–46.0)
Hemoglobin: 12.2 g/dL (ref 12.0–15.0)
MCH: 26.4 pg (ref 26.0–34.0)
MCHC: 31.9 g/dL (ref 30.0–36.0)
MCV: 82.7 fL (ref 80.0–100.0)
Platelets: 241 10*3/uL (ref 150–400)
RBC: 4.62 MIL/uL (ref 3.87–5.11)
RDW: 14.2 % (ref 11.5–15.5)
WBC: 13 10*3/uL — ABNORMAL HIGH (ref 4.0–10.5)
nRBC: 0 % (ref 0.0–0.2)

## 2022-05-15 MED ORDER — POTASSIUM CHLORIDE CRYS ER 20 MEQ PO TBCR
40.0000 meq | EXTENDED_RELEASE_TABLET | Freq: Once | ORAL | Status: AC
  Administered 2022-05-15: 40 meq via ORAL
  Filled 2022-05-15: qty 2

## 2022-05-15 MED ORDER — IOHEXOL 9 MG/ML PO SOLN
ORAL | Status: AC
  Administered 2022-05-15: 500 mL
  Filled 2022-05-15: qty 1000

## 2022-05-15 MED ORDER — IOHEXOL 300 MG/ML  SOLN
70.0000 mL | Freq: Once | INTRAMUSCULAR | Status: AC | PRN
  Administered 2022-05-15: 70 mL via INTRAVENOUS

## 2022-05-15 MED ORDER — CARBAMAZEPINE 200 MG PO TABS
200.0000 mg | ORAL_TABLET | Freq: Two times a day (BID) | ORAL | Status: DC
  Administered 2022-05-15 – 2022-06-01 (×32): 200 mg via ORAL
  Filled 2022-05-15 (×37): qty 1

## 2022-05-15 NOTE — Plan of Care (Signed)

## 2022-05-15 NOTE — Anesthesia Preprocedure Evaluation (Signed)
Anesthesia Evaluation  Patient identified by MRN, date of birth, ID band Patient confused    Reviewed: Allergy & Precautions, NPO status , Patient's Chart, lab work & pertinent test results  Airway Mallampati: II  TM Distance: >3 FB Neck ROM: Full    Dental no notable dental hx.    Pulmonary neg pulmonary ROS, former smoker,    Pulmonary exam normal        Cardiovascular hypertension, Pt. on medications and Pt. on home beta blockers  Rhythm:Regular Rate:Normal     Neuro/Psych Brain tumor negative psych ROS   GI/Hepatic negative GI ROS, Neg liver ROS,   Endo/Other  negative endocrine ROS  Renal/GU negative Renal ROS  negative genitourinary   Musculoskeletal  (+) Arthritis , Osteoarthritis,    Abdominal Normal abdominal exam  (+)   Peds  Hematology negative hematology ROS (+)   Anesthesia Other Findings   Reproductive/Obstetrics                            Anesthesia Physical Anesthesia Plan  ASA: 3  Anesthesia Plan: General   Post-op Pain Management:    Induction: Intravenous  PONV Risk Score and Plan: 3 and Ondansetron, Dexamethasone, Midazolam and Treatment may vary due to age or medical condition  Airway Management Planned: Mask and Oral ETT  Additional Equipment: Arterial line  Intra-op Plan:   Post-operative Plan: Extubation in OR  Informed Consent: I have reviewed the patients History and Physical, chart, labs and discussed the procedure including the risks, benefits and alternatives for the proposed anesthesia with the patient or authorized representative who has indicated his/her understanding and acceptance.     Dental advisory given  Plan Discussed with: CRNA  Anesthesia Plan Comments: (Lab Results      Component                Value               Date                      WBC                      13.0 (H)            05/15/2022                HGB                       12.2                05/15/2022                HCT                      38.2                05/15/2022                MCV                      82.7                05/15/2022                PLT  241                 05/15/2022           Lab Results      Component                Value               Date                      NA                       142                 05/15/2022                K                        3.3 (L)             05/15/2022                CO2                      22                  05/15/2022                GLUCOSE                  78                  05/15/2022                BUN                      12                  05/15/2022                CREATININE               0.74                05/15/2022                CALCIUM                  9.8                 05/15/2022                GFRNONAA                 >60                 05/15/2022          )       Anesthesia Quick Evaluation

## 2022-05-15 NOTE — Progress Notes (Signed)
PROGRESS NOTE    [name]  BJY:[id number] DOB: 04-09-1956 DOA: 05/13/2022 PCP: Willene Hatchet, NP    Brief Narrative:   April Saunders is a 66 y.o. female with medical history significant of hypertension, hyperlipidemia, degenerative joint disease, obesity presented hospital with fatigue, weakness for 3 to 4 weeks with difficulty balancing himself  and falls.  In the ED, patient had left upper extremity deficit.  Further work-up in the ED showed tumor in the right deep gray matter with extensive vasogenic edema and possible lesion in the right cerebellum. Case was discussed with  neurosurgery.  Patient was then initiated on dexamethasone and Keppra and was admitted to the hospital for further evaluation and treatment.   After hospitalization, patient has been followed by neurosurgery.  CT scan of the brain brain protocol showed enhancing lesions in the brain.  CT scan of the chest abdomen and pelvis done 05/15/2022 without any evidence of lymphadenopathy.  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  MRI shows multifocal somewhat infiltrative and hypercellular enhancing brain masses involving both cerebral hemispheres and brainstem and citrate cerebellum.  Possibility of CNS lymphoma.  Extensive brain and brainstem edema with midline shift of 7 mm was reported.  Continue Decadron and Keppra.  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  CT scan of the chest abdomen and pelvis with no findings of lymphadenopathy to suggest lymphoma.  Neurosurgery has seen the patient today and planning for a stereotactic biopsy with Dr. Marcello Moores on 05/16/2022.    Hyperlipidemia. Continue statins.  Mild hypokalemia.  Continue to replace orally.  Check levels in AM.  Patient takes potassium at home as well.  Essential hypertension. Continue to hold antihypertensives for now.  Blood pressure 122/82 at this time.  Patient is on HCTZ and amlodipine at home.  History of trigeminal neuralgia.  Patient takes carbamazepine at home.  Resume.     DVT prophylaxis: SCDs Start: 05/13/22 1051   Code Status:     Code Status: Full Code  Disposition: Unknown at this time  Status is: Inpatient  Remains inpatient appropriate because: Neurosurgical evaluation, intracranial mass   Family Communication:  Spoke with the patient's daughter Ms Claiborne Billings on the phone on 05/14/2022.  I also spoke with the patient's other daughter at bedside on 05/15/2022  Consultants:  Neurosurgery  Procedures:  None  Antimicrobials:  None  Anti-infectives (From admission, onward)    None      Subjective: Today, patient was seen and examined at bedside.  Patient denies any nausea vomiting headache.  Was able to eat better.  Was a little more alert awake today as per the family and nursing staff.  Objective: Vitals:   05/15/22 0359 05/15/22 0500 05/15/22 0730 05/15/22 1239  BP: (!) 141/82  131/75 122/82  Pulse: (!) 52  78 74  Resp: '18  20 20  '$ Temp: 97.6 F (36.4 C)  (!) 97.3 F (36.3 C)   TempSrc: Axillary     SpO2: 97%  99% 93%  Weight:  62.7 kg    Height:        Intake/Output Summary (Last 24 hours) at 05/15/2022 1318 Last data filed at 05/14/2022 2300 Gross per 24 hour  Intake 340 ml  Output 950 ml  Net -610 ml   Filed Weights   05/13/22 0011 05/15/22 0500  Weight: 64.4 kg 62.7 kg    Physical Examination: Body mass index is 24.49 kg/m.   General:  Average built, not in obvious distress, alert awake and Communicative HENT:   No scleral pallor or icterus noted. Oral mucosa is moist.  Chest:  Clear breath sounds.  Diminished breath sounds bilaterally. No  crackles or wheezes.  CVS: S1 &S2 heard. No murmur.  Regular rate and rhythm. Abdomen: Soft, nontender, nondistended.  Bowel sounds are heard.   Extremities: No cyanosis, clubbing or edema.  Peripheral pulses are palpable. Psych: Alert, awake and communicative, oriented to place and person. CNS:   Left upper extremity weakness noted more than rest of the extremities. Skin: Warm and dry.  No rashes noted.  Data Reviewed:   CBC: Recent Labs  Lab 05/13/22 0017 05/15/22 0214  WBC 7.1 13.0*  NEUTROABS 4.6  --   HGB 13.3 12.2  HCT 41.2 38.2  MCV 82.4 82.7  PLT 266 542    Basic Metabolic Panel: Recent Labs  Lab 05/13/22 0017 05/15/22 0214  NA 141 142  K 4.0 3.3*  CL 106 105  CO2 22 22  GLUCOSE 126* 78  BUN 14 12  CREATININE 0.88 0.74  CALCIUM 10.6* 9.8  MG  --  2.2    Liver Function Tests: Recent Labs  Lab 05/13/22 0017  AST 27  ALT 22  ALKPHOS 51  BILITOT 1.7*  PROT 7.9  ALBUMIN 4.5     Radiology Studies: CT CHEST ABDOMEN PELVIS W CONTRAST  Result Date: 05/15/2022 CLINICAL DATA:  Brain neoplasm. Question lymphoma. Staging. * Tracking Code: BO * EXAM: CT CHEST, ABDOMEN, AND PELVIS WITH CONTRAST TECHNIQUE: Multidetector CT imaging of the chest, abdomen and pelvis was performed following the standard protocol during bolus administration of intravenous contrast. RADIATION DOSE REDUCTION: This exam was performed according to the departmental dose-optimization program which includes automated exposure control, adjustment of the mA and/or kV according to patient size and/or use of iterative reconstruction technique. CONTRAST:  23m OMNIPAQUE IOHEXOL 300 MG/ML  SOLN COMPARISON:  None Available. FINDINGS: CT CHEST FINDINGS Cardiovascular: The heart size is normal. No substantial pericardial effusion. Mediastinum/Nodes: No mediastinal lymphadenopathy. There is no hilar lymphadenopathy. The esophagus has normal imaging features. There is no axillary lymphadenopathy. Lungs/Pleura:  2 mm right lower lobe pulmonary nodule identified on 59/4. No suspicious pulmonary nodule or mass. No focal airspace consolidation. No pleural effusion. Dependent atelectasis noted in both lower lobes. Musculoskeletal: No worrisome lytic or sclerotic osseous abnormality. CT ABDOMEN PELVIS FINDINGS Hepatobiliary: 8 mm hypodensity in the left liver is too small to characterize but likely benign. There is no evidence for gallstones, gallbladder wall thickening, or pericholecystic fluid. No intrahepatic or extrahepatic biliary dilation. Pancreas: No focal mass lesion. No dilatation of the main duct. No intraparenchymal cyst. No peripancreatic edema. Spleen: No splenomegaly. No focal mass lesion. Adrenals/Urinary Tract: No adrenal nodule or mass. Kidneys unremarkable. No evidence for hydroureter. The urinary bladder appears normal for the degree of distention. Dependent contrast in the bladder lumen compatible with infused head CT yesterday. Stomach/Bowel: Stomach is distended with fluid and gas. Duodenum is normally positioned as is the ligament of Treitz. No small bowel wall thickening. No small bowel dilatation. The terminal ileum is normal. The appendix is normal. No gross colonic mass. No colonic wall thickening. Vascular/Lymphatic: No abdominal aortic aneurysm. No abdominal aortic atherosclerotic calcification.  There is no gastrohepatic or hepatoduodenal ligament lymphadenopathy. No retroperitoneal or mesenteric lymphadenopathy. No pelvic sidewall lymphadenopathy. Reproductive: Unremarkable. Other: No intraperitoneal free fluid. Musculoskeletal: No worrisome lytic or sclerotic osseous abnormality. IMPRESSION: 1. No lymphadenopathy or other findings to suggest lymphoma in the chest, abdomen, or pelvis. 2. 2 mm right lower lobe pulmonary nodule. No follow-up needed if patient is low-risk.This recommendation follows the consensus statement: Guidelines for Management of Incidental Pulmonary Nodules Detected on CT  Images: From the Fleischner Society 2017; Radiology 2017; 284:228-243. 3. 8 mm hypodensity in the left liver is too small to characterize but likely benign. Electronically Signed   By: Misty Stanley M.D.   On: 05/15/2022 12:42   CT BRAINLAB HEAD WO/W CONTRAST (1MM)  Result Date: 05/14/2022 CLINICAL DATA:  Brain tumor.  Stealth protocol. EXAM: CT HEAD WITHOUT AND WITH CONTRAST TECHNIQUE: Contiguous axial images were obtained from the base of the skull through the vertex without and with intravenous contrast. RADIATION DOSE REDUCTION: This exam was performed according to the departmental dose-optimization program which includes automated exposure control, adjustment of the mA and/or kV according to patient size and/or use of iterative reconstruction technique. CONTRAST:  74m OMNIPAQUE IOHEXOL 300 MG/ML  SOLN COMPARISON:  CT and MRI yesterday. FINDINGS: Brain: Enhancing 11 mm lesion within the right cerebellum with surrounding edema. Enhancing 2.3 cm in diameter mass with the epicenter at the inferior right basal ganglia with extensive surrounding edema. Indistinct enhancing mass of the posterior mesial temporal lobe on the left, unable to accurately measure by CT. Mild associated edema. Subtle foci of enhancement of the dorsal mid brain and right corona radiata, better shown by MRI. Edema associated with the dominant right basal ganglia lesion is responsible for mass effect and right to left midline shift of 3 mm. No ventricular trapping. No extra-axial fluid collection. Vascular: No primary vascular lesion. Skull: Normal Sinuses/Orbits: Clear/normal Other: None IMPRESSION: Multiple brightly enhancing mass lesions throughout the infratentorial and supratentorial brain as outlined above, most consistent with CNS lymphoma. Lesions are associated with vasogenic edema. The dominant lesion in the right basal ganglia, maximal dimension 2.3 cm by CT, results in mass effect with right-to-left shift of 3 mm. Electronically  Signed   By: MNelson ChimesM.D.   On: 05/14/2022 15:53   EEG adult  Result Date: 05/13/2022 YLora Havens MD     05/13/2022 10:25 PM Patient Name: BTehila SokolowMRN: [id number] Attending: PLora HavensReferring Physician/Provider: TClance Boll MD Date: 05/13/2022 Duration: 22.11 mins Patient history: 63yoF with multifocal brain tumors with associated vasogenic edema and left sided weakness. EEG to evaluate for seizure Level of alertness: Awake AEDs during EEG study: LEV Technical aspects: This EEG study was done with scalp electrodes positioned according to the 10-20 International system of electrode placement. Electrical activity was acquired at a sampling rate of '500Hz'$  and reviewed with a high frequency filter of '70Hz'$  and a low frequency filter of '1Hz'$ . EEG data were recorded continuously and digitally stored. Description: The posterior dominant rhythm consists of 8-9 Hz activity of moderate voltage (25-35 uV) seen predominantly in posterior head regions, symmetric and reactive to eye opening and eye closing. EEG showed continuous generalized and lateralized right hemisphere polymorphic sharply contoured 3 to 6 Hz theta-delta slowing admixed with 15 to 18 Hz, 2-3 uV beta activity in left hemisphere. Hyperventilation and photic stimulation were not performed.   ABNORMALITY - Continuous slow, generalized and lateralized right hemisphere IMPRESSION: This study is suggestive of cortical  dysfunction arising from right hemisphere likely secondary to underlying structural abnormality. Additionally there is mild to moderate diffuse encephalopathy, nonspecific etiology. No seizures or epileptiform discharges were seen throughout the recording. [place]      LOS: 2 days    Flora Lipps, MD [facility] Available via Epic secure chat 7am-7pm After these hours, please refer to coverage provider listed on [email or web address] 05/15/2022, 1:18 PM

## 2022-05-15 NOTE — Progress Notes (Signed)
  NEUROSURGERY PROGRESS NOTE   No issues overnight.   EXAM:  BP 131/75 (BP Location: Right Arm)   Pulse 78   Temp (!) 97.3 F (36.3 C)   Resp 20   Ht '5\' 3"'$  (1.6 m)   Wt 62.7 kg   SpO2 99%   BMI 24.49 kg/m   Arouses easily Speech fluent CN grossly intact  Diffuse 4-4+/5 weakness  IMPRESSION:  66 y.o. female presenting with generalized fatigue and HA, imaging reveals multifocal enhancing mass, possibly lymphoma.  PLAN: - CT C/A/P today - planning on stereotactic biopsy with Dr. Marcello Moores tomorrow - NPO p MN   Consuella Lose, MD [facility]

## 2022-05-16 ENCOUNTER — Other Ambulatory Visit: Payer: Self-pay

## 2022-05-16 ENCOUNTER — Inpatient Hospital Stay (HOSPITAL_COMMUNITY): Payer: Medicare Other | Admitting: Certified Registered Nurse Anesthetist

## 2022-05-16 ENCOUNTER — Encounter (HOSPITAL_COMMUNITY): Payer: Self-pay | Admitting: Internal Medicine

## 2022-05-16 ENCOUNTER — Inpatient Hospital Stay (HOSPITAL_COMMUNITY)
Admission: EM | Disposition: A | Discharge status: Skilled Nursing Facility | Payer: Self-pay | Source: Home / Self Care | Attending: Internal Medicine

## 2022-05-16 DIAGNOSIS — Z87891 Personal history of nicotine dependence: Secondary | ICD-10-CM

## 2022-05-16 DIAGNOSIS — M1990 Unspecified osteoarthritis, unspecified site: Secondary | ICD-10-CM

## 2022-05-16 DIAGNOSIS — D496 Neoplasm of unspecified behavior of brain: Secondary | ICD-10-CM

## 2022-05-16 DIAGNOSIS — E876 Hypokalemia: Secondary | ICD-10-CM | POA: Diagnosis not present

## 2022-05-16 DIAGNOSIS — I10 Essential (primary) hypertension: Secondary | ICD-10-CM

## 2022-05-16 DIAGNOSIS — E785 Hyperlipidemia, unspecified: Secondary | ICD-10-CM | POA: Diagnosis not present

## 2022-05-16 DIAGNOSIS — G500 Trigeminal neuralgia: Secondary | ICD-10-CM

## 2022-05-16 HISTORY — PX: APPLICATION OF CRANIAL NAVIGATION: SHX6578

## 2022-05-16 HISTORY — PX: BRAIN BIOPSY: SHX6409

## 2022-05-16 LAB — BASIC METABOLIC PANEL
Anion gap: 10 (ref 5–15)
BUN: 9 mg/dL (ref 8–23)
CO2: 26 mmol/L (ref 22–32)
Calcium: 9.8 mg/dL (ref 8.9–10.3)
Chloride: 106 mmol/L (ref 98–111)
Creatinine, Ser: 0.81 mg/dL (ref 0.44–1.00)
GFR, Estimated: >60 mL/min (ref >60)
Glucose, Bld: 104 mg/dL — ABNORMAL HIGH (ref 70–99)
Potassium: 4 mmol/L (ref 3.5–5.1)
Sodium: 142 mmol/L (ref 135–145)

## 2022-05-16 LAB — CBC
HCT: 39.6 % (ref 36.0–46.0)
Hemoglobin: 12.8 g/dL (ref 12.0–15.0)
MCH: 26.7 pg (ref 26.0–34.0)
MCHC: 32.3 g/dL (ref 30.0–36.0)
MCV: 82.5 fL (ref 80.0–100.0)
Platelets: 222 10*3/uL (ref 150–400)
RBC: 4.8 MIL/uL (ref 3.87–5.11)
RDW: 14.2 % (ref 11.5–15.5)
WBC: 6.4 10*3/uL (ref 4.0–10.5)
nRBC: 0 % (ref 0.0–0.2)

## 2022-05-16 LAB — ABO/RH: ABO/RH(D): B POS

## 2022-05-16 LAB — GLUCOSE, CAPILLARY: Glucose-Capillary: 120 mg/dL — ABNORMAL HIGH (ref 70–99)

## 2022-05-16 LAB — TYPE AND SCREEN
ABO/RH(D): B POS
Antibody Screen: NEGATIVE

## 2022-05-16 LAB — MAGNESIUM: Magnesium: 2 mg/dL (ref 1.7–2.4)

## 2022-05-16 SURGERY — BRAIN BIOPSY
Anesthesia: General | Site: Head | Laterality: Right

## 2022-05-16 MED ORDER — ROCURONIUM BROMIDE 10 MG/ML (PF) SYRINGE
PREFILLED_SYRINGE | INTRAVENOUS | Status: AC
  Filled 2022-05-16: qty 10

## 2022-05-16 MED ORDER — THROMBIN 5000 UNITS EX SOLR
CUTANEOUS | Status: AC
  Filled 2022-05-16: qty 10000

## 2022-05-16 MED ORDER — FENTANYL CITRATE (PF) 250 MCG/5ML IJ SOLN
INTRAMUSCULAR | Status: DC | PRN
  Administered 2022-05-16: 125 ug via INTRAVENOUS

## 2022-05-16 MED ORDER — ORAL CARE MOUTH RINSE: 15.0000 mL | Freq: Once | OROMUCOSAL | Status: DC

## 2022-05-16 MED ORDER — LIDOCAINE 2% (20 MG/ML) 5 ML SYRINGE
INTRAMUSCULAR | Status: AC
  Filled 2022-05-16: qty 5

## 2022-05-16 MED ORDER — DEXAMETHASONE SODIUM PHOSPHATE 4 MG/ML IJ SOLN
4.0000 mg | Freq: Four times a day (QID) | INTRAMUSCULAR | Status: DC
  Administered 2022-05-16 – 2022-05-17 (×3): 4 mg via INTRAVENOUS
  Filled 2022-05-16 (×3): qty 1

## 2022-05-16 MED ORDER — ROCURONIUM BROMIDE 10 MG/ML (PF) SYRINGE
PREFILLED_SYRINGE | INTRAVENOUS | Status: DC | PRN
  Administered 2022-05-16: 60 mg via INTRAVENOUS
  Administered 2022-05-16 (×2): 20 mg via INTRAVENOUS

## 2022-05-16 MED ORDER — CHLORHEXIDINE GLUCONATE 0.12 % MT SOLN: 15.0000 mL | Freq: Once | OROMUCOSAL | Status: DC

## 2022-05-16 MED ORDER — PHENYLEPHRINE HCL-NACL 20-0.9 MG/250ML-% IV SOLN
INTRAVENOUS | Status: DC | PRN
  Administered 2022-05-16: 30 ug/min via INTRAVENOUS

## 2022-05-16 MED ORDER — EPHEDRINE 5 MG/ML INJ
INTRAVENOUS | Status: AC
  Filled 2022-05-16: qty 5

## 2022-05-16 MED ORDER — CHLORHEXIDINE GLUCONATE CLOTH 2 % EX PADS
6.0000 | MEDICATED_PAD | Freq: Every day | CUTANEOUS | Status: DC
  Administered 2022-05-16 – 2022-05-22 (×7): 6 via TOPICAL

## 2022-05-16 MED ORDER — PROPOFOL 10 MG/ML IV BOLUS
INTRAVENOUS | Status: AC
  Filled 2022-05-16: qty 20

## 2022-05-16 MED ORDER — BUPIVACAINE HCL (PF) 0.5 % IJ SOLN
INTRAMUSCULAR | Status: AC
  Filled 2022-05-16: qty 30

## 2022-05-16 MED ORDER — ONDANSETRON HCL 4 MG/2ML IJ SOLN
INTRAMUSCULAR | Status: AC
  Filled 2022-05-16: qty 2

## 2022-05-16 MED ORDER — PROMETHAZINE HCL 25 MG PO TABS: 12.5000 mg | ORAL_TABLET | ORAL | Status: DC | PRN

## 2022-05-16 MED ORDER — SUGAMMADEX SODIUM 200 MG/2ML IV SOLN
INTRAVENOUS | Status: DC | PRN
  Administered 2022-05-16 (×4): 100 mg via INTRAVENOUS

## 2022-05-16 MED ORDER — ESMOLOL HCL 100 MG/10ML IV SOLN
INTRAVENOUS | Status: AC
  Filled 2022-05-16: qty 10

## 2022-05-16 MED ORDER — ACETAMINOPHEN 325 MG PO TABS
650.0000 mg | ORAL_TABLET | ORAL | Status: DC | PRN
  Administered 2022-05-22 – 2022-05-30 (×5): 650 mg via ORAL
  Filled 2022-05-16 (×5): qty 2

## 2022-05-16 MED ORDER — THROMBIN (RECOMBINANT) 5000 UNITS EX SOLR: CUTANEOUS | Status: DC | PRN

## 2022-05-16 MED ORDER — CLEVIDIPINE BUTYRATE 0.5 MG/ML IV EMUL
INTRAVENOUS | Status: DC | PRN
  Administered 2022-05-16: 2 mg/h via INTRAVENOUS

## 2022-05-16 MED ORDER — PROPOFOL 10 MG/ML IV BOLUS
INTRAVENOUS | Status: DC | PRN
  Administered 2022-05-16: 20 mg via INTRAVENOUS
  Administered 2022-05-16: 100 mg via INTRAVENOUS
  Administered 2022-05-16: 20 mg via INTRAVENOUS

## 2022-05-16 MED ORDER — EPHEDRINE SULFATE-NACL 50-0.9 MG/10ML-% IV SOSY
PREFILLED_SYRINGE | INTRAVENOUS | Status: DC | PRN
  Administered 2022-05-16: 2.5 mg via INTRAVENOUS
  Administered 2022-05-16: 5 mg via INTRAVENOUS
  Administered 2022-05-16: 2.5 mg via INTRAVENOUS

## 2022-05-16 MED ORDER — BACITRACIN ZINC 500 UNIT/GM EX OINT
TOPICAL_OINTMENT | CUTANEOUS | Status: AC
  Filled 2022-05-16: qty 28.35

## 2022-05-16 MED ORDER — SODIUM CHLORIDE 0.9 % IV SOLN
0.1500 ug/kg/min | INTRAVENOUS | Status: DC
  Administered 2022-05-16: .15 ug/kg/min via INTRAVENOUS
  Filled 2022-05-16: qty 2000

## 2022-05-16 MED ORDER — FENTANYL CITRATE (PF) 100 MCG/2ML IJ SOLN: 25.0000 ug | INTRAMUSCULAR | Status: DC | PRN

## 2022-05-16 MED ORDER — ONDANSETRON HCL 4 MG/2ML IJ SOLN
INTRAMUSCULAR | Status: DC | PRN
  Administered 2022-05-16: 4 mg via INTRAVENOUS

## 2022-05-16 MED ORDER — SODIUM CHLORIDE 0.9 % IV SOLN: INTRAVENOUS | Status: DC | PRN

## 2022-05-16 MED ORDER — HYDROMORPHONE HCL 1 MG/ML IJ SOLN
0.5000 mg | INTRAMUSCULAR | Status: DC | PRN
  Administered 2022-05-16 – 2022-05-17 (×2): 1 mg via INTRAVENOUS
  Administered 2022-05-18 – 2022-05-21 (×2): 0.5 mg via INTRAVENOUS
  Filled 2022-05-16 (×5): qty 1

## 2022-05-16 MED ORDER — LIDOCAINE-EPINEPHRINE 1 %-1:100000 IJ SOLN
INTRAMUSCULAR | Status: DC | PRN
  Administered 2022-05-16: 3.5 mL

## 2022-05-16 MED ORDER — SODIUM CHLORIDE 0.9 % IV SOLN: INTRAVENOUS | Status: DC

## 2022-05-16 MED ORDER — HYDROCODONE-ACETAMINOPHEN 5-325 MG PO TABS
1.0000 | ORAL_TABLET | ORAL | Status: DC | PRN
  Administered 2022-05-17 – 2022-05-31 (×21): 1 via ORAL
  Filled 2022-05-16 (×21): qty 1

## 2022-05-16 MED ORDER — BACITRACIN ZINC 500 UNIT/GM EX OINT
TOPICAL_OINTMENT | CUTANEOUS | Status: DC | PRN
  Administered 2022-05-16: 1 via TOPICAL

## 2022-05-16 MED ORDER — THROMBIN 5000 UNITS EX SOLR
CUTANEOUS | Status: AC
  Filled 2022-05-16: qty 5000

## 2022-05-16 MED ORDER — ONDANSETRON HCL 4 MG PO TABS: 4.0000 mg | ORAL_TABLET | ORAL | Status: DC | PRN

## 2022-05-16 MED ORDER — ACETAMINOPHEN 650 MG RE SUPP: 650.0000 mg | RECTAL | Status: DC | PRN

## 2022-05-16 MED ORDER — ACETAMINOPHEN 10 MG/ML IV SOLN: 1000.0000 mg | Freq: Once | INTRAVENOUS | Status: DC | PRN

## 2022-05-16 MED ORDER — CEFAZOLIN SODIUM-DEXTROSE 1-4 GM/50ML-% IV SOLN
1.0000 g | Freq: Three times a day (TID) | INTRAVENOUS | Status: AC
  Administered 2022-05-16 – 2022-05-17 (×3): 1 g via INTRAVENOUS
  Filled 2022-05-16 (×3): qty 50

## 2022-05-16 MED ORDER — BUPIVACAINE HCL (PF) 0.5 % IJ SOLN
INTRAMUSCULAR | Status: DC | PRN
  Administered 2022-05-16: 3.5 mL

## 2022-05-16 MED ORDER — DEXAMETHASONE SODIUM PHOSPHATE 10 MG/ML IJ SOLN
INTRAMUSCULAR | Status: AC
  Filled 2022-05-16: qty 1

## 2022-05-16 MED ORDER — NICARDIPINE HCL IN NACL 20-0.86 MG/200ML-% IV SOLN
3.0000 mg/h | INTRAVENOUS | Status: DC
  Filled 2022-05-16: qty 200

## 2022-05-16 MED ORDER — CHLORHEXIDINE GLUCONATE CLOTH 2 % EX PADS: 6.0000 | MEDICATED_PAD | Freq: Once | CUTANEOUS | Status: DC

## 2022-05-16 MED ORDER — CHLORHEXIDINE GLUCONATE CLOTH 2 % EX PADS
6.0000 | MEDICATED_PAD | Freq: Once | CUTANEOUS | Status: AC
  Administered 2022-05-16: 6 via TOPICAL

## 2022-05-16 MED ORDER — PHENYLEPHRINE 80 MCG/ML (10ML) SYRINGE FOR IV PUSH (FOR BLOOD PRESSURE SUPPORT)
PREFILLED_SYRINGE | INTRAVENOUS | Status: AC
  Filled 2022-05-16: qty 10

## 2022-05-16 MED ORDER — LABETALOL HCL 5 MG/ML IV SOLN
10.0000 mg | INTRAVENOUS | Status: DC | PRN
  Administered 2022-05-16 – 2022-05-19 (×2): 10 mg via INTRAVENOUS
  Filled 2022-05-16 (×3): qty 4

## 2022-05-16 MED ORDER — LIDOCAINE-EPINEPHRINE 1 %-1:100000 IJ SOLN
INTRAMUSCULAR | Status: AC
  Filled 2022-05-16: qty 1

## 2022-05-16 MED ORDER — PANTOPRAZOLE SODIUM 40 MG IV SOLR
40.0000 mg | Freq: Every day | INTRAVENOUS | Status: DC
  Administered 2022-05-16 – 2022-05-17 (×2): 40 mg via INTRAVENOUS
  Filled 2022-05-16 (×2): qty 10

## 2022-05-16 MED ORDER — CEFAZOLIN SODIUM-DEXTROSE 2-4 GM/100ML-% IV SOLN
2.0000 g | INTRAVENOUS | Status: AC
  Administered 2022-05-16: 2 g via INTRAVENOUS
  Filled 2022-05-16: qty 100

## 2022-05-16 MED ORDER — ONDANSETRON HCL 4 MG/2ML IJ SOLN
4.0000 mg | INTRAMUSCULAR | Status: DC | PRN
Start: 2022-05-16 — End: 2022-06-02

## 2022-05-16 MED ORDER — ENOXAPARIN SODIUM 40 MG/0.4ML IJ SOSY: 40.0000 mg | PREFILLED_SYRINGE | INTRAMUSCULAR | Status: DC

## 2022-05-16 MED ORDER — DEXAMETHASONE SODIUM PHOSPHATE 10 MG/ML IJ SOLN
INTRAMUSCULAR | Status: DC | PRN
  Administered 2022-05-16: 10 mg via INTRAVENOUS

## 2022-05-16 MED ORDER — 0.9 % SODIUM CHLORIDE (POUR BTL) OPTIME
TOPICAL | Status: DC | PRN
  Administered 2022-05-16: 1000 mL

## 2022-05-16 MED ORDER — FENTANYL CITRATE (PF) 250 MCG/5ML IJ SOLN
INTRAMUSCULAR | Status: AC
  Filled 2022-05-16: qty 5

## 2022-05-16 SURGICAL SUPPLY — 49 items
BAG COUNTER SPONGE SURGICOUNT (BAG) ×4 IMPLANT
BAG SPNG CNTER NS LX DISP (BAG) ×4
BATTERY IQ STERILE (MISCELLANEOUS) ×2 IMPLANT
BLADE CLIPPER SURG (BLADE) ×1 IMPLANT
BNDG ADH 1X3 SHEER STRL LF (GAUZE/BANDAGES/DRESSINGS) IMPLANT
BNDG ADH THN 3X1 STRL LF (GAUZE/BANDAGES/DRESSINGS)
BTRY SRG DRVR 1.5 IQ (MISCELLANEOUS)
CANISTER SUCT 3000ML PPV (MISCELLANEOUS) ×3 IMPLANT
CARTRIDGE OIL MAESTRO DRILL (MISCELLANEOUS) ×2 IMPLANT
CNTNR URN SCR LID CUP LEK RST (MISCELLANEOUS) ×6 IMPLANT
CONT SPEC 4OZ STRL OR WHT (MISCELLANEOUS) ×9
DRAPE NEUROLOGICAL W/INCISE (DRAPES) ×3 IMPLANT
DRSG TELFA 3X8 NADH (GAUZE/BANDAGES/DRESSINGS) ×3 IMPLANT
DURAPREP 26ML APPLICATOR (WOUND CARE) ×1 IMPLANT
ELECT REM PT RETURN 9FT ADLT (ELECTROSURGICAL) ×3
ELECTRODE REM PT RTRN 9FT ADLT (ELECTROSURGICAL) ×2 IMPLANT
GLOVE BIOGEL PI IND STRL 7.5 (GLOVE) ×4 IMPLANT
GLOVE BIOGEL PI INDICATOR 7.5 (GLOVE) ×2
GLOVE ECLIPSE 7.0 STRL STRAW (GLOVE) ×4 IMPLANT
GLOVE SURG ENC MOIS LTX SZ8 (GLOVE) ×4 IMPLANT
GLOVE SURG UNDER POLY LF SZ8.5 (GLOVE) ×4 IMPLANT
GOWN STRL REUS W/ TWL LRG LVL3 (GOWN DISPOSABLE) IMPLANT
GOWN STRL REUS W/ TWL XL LVL3 (GOWN DISPOSABLE) ×2 IMPLANT
GOWN STRL REUS W/TWL LRG LVL3 (GOWN DISPOSABLE) ×6
GOWN STRL REUS W/TWL XL LVL3 (GOWN DISPOSABLE) ×6
KIT BASIN OR (CUSTOM PROCEDURE TRAY) ×3 IMPLANT
KIT NDL BIOPSY 1.8X235 CRAN (NEEDLE) ×2 IMPLANT
KIT NEEDLE BIOPSY 1.8X235 CRAN (NEEDLE) ×3 IMPLANT
KIT TURNOVER KIT B (KITS) ×3 IMPLANT
MARKER SPHERE PSV REFLC 13MM (MARKER) ×7 IMPLANT
NDL HYPO 25X1 1.5 SAFETY (NEEDLE) ×2 IMPLANT
NEEDLE HYPO 25X1 1.5 SAFETY (NEEDLE) ×3 IMPLANT
NS IRRIG 1000ML POUR BTL (IV SOLUTION) ×3 IMPLANT
OIL CARTRIDGE MAESTRO DRILL (MISCELLANEOUS) ×3
PACK CRANIOTOMY CUSTOM (CUSTOM PROCEDURE TRAY) ×3 IMPLANT
PAD ARMBOARD 7.5X6 YLW CONV (MISCELLANEOUS) ×9 IMPLANT
PAD DRESSING TELFA 3X8 NADH (GAUZE/BANDAGES/DRESSINGS) ×2 IMPLANT
PERFORATOR LRG  14-11MM (BIT) ×3
PERFORATOR LRG 14-11MM (BIT) ×2 IMPLANT
PIN MAYFIELD SKULL DISP (PIN) ×3 IMPLANT
SPONGE SURGIFOAM ABS GEL SZ50 (HEMOSTASIS) ×1 IMPLANT
STAPLER SKIN PROX WIDE 3.9 (STAPLE) ×3 IMPLANT
STAPLER VISISTAT 35W (STAPLE) ×1 IMPLANT
SUT VIC AB 3-0 SH 8-18 (SUTURE) ×3 IMPLANT
SUT VICRYL RAPIDE 3 0 (SUTURE) ×1 IMPLANT
SYR 5ML LL (SYRINGE) ×6 IMPLANT
TOWEL GREEN STERILE (TOWEL DISPOSABLE) ×3 IMPLANT
TOWEL GREEN STERILE FF (TOWEL DISPOSABLE) ×3 IMPLANT
WATER STERILE IRR 1000ML POUR (IV SOLUTION) ×3 IMPLANT

## 2022-05-16 NOTE — Plan of Care (Signed)
  Problem: Clinical Measurements: Goal: Ability to maintain clinical measurements within normal limits will improve Outcome: Progressing   

## 2022-05-16 NOTE — Transfer of Care (Signed)
Immediate Anesthesia Transfer of Care Note  Patient: April Saunders  Procedure(s) Performed: STEREOTACTIC BRAIN BIOPSY (Right: Head) APPLICATION OF CRANIAL NAVIGATION (Right)  Patient Location: PACU  Anesthesia Type:General  Level of Consciousness: drowsy  Airway & Oxygen Therapy: Patient Spontanous Breathing and Patient connected to face mask oxygen  Post-op Assessment: Report given to RN and Post -op Vital signs reviewed and stable  Post vital signs: Reviewed and stable  Last Vitals:  Vitals Value Taken Time  BP 106/57 05/16/22 1350  Temp    Pulse 83 05/16/22 1354  Resp 14 05/16/22 1358  SpO2 99 % 05/16/22 1354  Vitals shown include unvalidated device data.  Last Pain:  Vitals:   05/16/22 1004  TempSrc: Oral  PainSc:          Complications: No notable events documented.

## 2022-05-16 NOTE — Progress Notes (Signed)
Neurosurgery  CT Chest/abd/pelvis showed no definite primary site.  Plan for biopsy today of CNS lesion for diagnosis.  She is lethargic, localizes R>L in [place].

## 2022-05-16 NOTE — Anesthesia Procedure Notes (Signed)
Procedure Name: Intubation Date/Time: 05/16/2022 11:48 AM  Performed by: Janene Harvey, CRNAPre-anesthesia Checklist: Patient identified, Emergency Drugs available, Suction available and Patient being monitored Patient Re-evaluated:Patient Re-evaluated prior to induction Oxygen Delivery Method: Circle system utilized Preoxygenation: Pre-oxygenation with 100% oxygen Induction Type: IV induction Ventilation: Mask ventilation without difficulty Laryngoscope Size: Mac and 4 Grade View: Grade I Tube type: Oral Tube size: 7.0 mm Number of attempts: 1 Airway Equipment and Method: Stylet and Oral airway Placement Confirmation: ETT inserted through vocal cords under direct vision, positive ETCO2 and breath sounds checked- equal and bilateral Secured at: 21 cm Tube secured with: Tape Dental Injury: Teeth and Oropharynx as per pre-operative assessment

## 2022-05-16 NOTE — Progress Notes (Signed)
Called patients daughter and left a voicemail because patient is confused and can not sign consent for biopsy at this time.

## 2022-05-16 NOTE — Progress Notes (Signed)
Patient ID: April Saunders, female   DOB: 1956/01/06, 66 y.o.   MRN: [id number] BP 114/82   Pulse (!) 59   Temp 97.6 F (36.4 C) (Oral)   Resp 15   Ht '5\' 3"'$  (1.6 m)   Wt 62.7 kg   SpO2 100%   BMI 24.49 kg/m  Patient examined in the PACU. Not following commands, nor opening eyes. Localizing briskly with the right upper extremity. Plegic on left side, will withdraw slightly with noxious stimuli. Given location of the tumor and the surrounding edema a normal exam on her left side would be unusual. Perrl. Vitals are within normal parameters. Do not believe repeat imaging needed at this time.

## 2022-05-16 NOTE — Progress Notes (Signed)
1000 Received pt from [facility], lethargic, moans with sternal rub. No verbal response. Per pt's nurse from [facility] pt has been non-responsive. I called the pt's daughter Antoinetta, pt has been non-responsive, non-verbal since yesterday. Dr Gloris Manchester notified and came in to assess the pt.

## 2022-05-16 NOTE — Progress Notes (Signed)
PROGRESS NOTE    Lakesa Coste  ZOX:[id number] DOB: 08-01-1956 DOA: 05/13/2022 PCP: Willene Hatchet, NP    Chief Complaint  Patient presents with   Weakness    Brief Narrative:  Natalija Mavis is a 66 y.o. female with medical history significant of hypertension, hyperlipidemia, degenerative joint disease, obesity presented hospital with fatigue, weakness for 3 to 4 weeks with difficulty balancing himself  and falls.  In the ED, patient had left upper extremity deficit.  Further work-up in the ED showed tumor in the right deep gray matter with extensive vasogenic edema and possible lesion in the right cerebellum. Case was discussed with  neurosurgery.  Patient was then initiated on dexamethasone and Keppra and was admitted to the hospital for further evaluation and treatment.    After hospitalization, patient has been followed by neurosurgery.  CT scan of the brain brain protocol showed enhancing lesions in the brain.  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 -MRI brain showed multifocal infiltrative and hypercellular enhancing brain masses involving both cerebral hemispheres, brainstem and cerebellum with concerns for CNS lymphoma.  Extensive brain and brainstem edema with midline shift of 7 mm noted. 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 -CT abdomen chest and pelvis with no findings of lymphadenopathy suggest suggest lymphoma. -Patient seen in  consultation by neurosurgery who recommended Decadron and Keppra. -Patient subsequently underwent stereotactic brain biopsy per neurosurgery, Dr. Marcello Moores today 05/16/2022. -We will defer oncology consultation to neurosurgery. -Supportive care.  2.  Hyperlipidemia -Statin.  3.  Mild hypokalemia -Patient noted to be on chronic potassium supplementation prior to admission. -Potassium repleted currently at 4.0, magnesium at 2.0.  4.  Hypertension -Continue to hold antihypertensive medications of HCTZ and amlodipine.  5.  History of trigeminal neuralgia -Continue home regimen carbamazepine   DVT prophylaxis: Lovenox Code Status: Full Family Communication: Updated daughter at bedside Disposition: TBD  Status is: Inpatient Remains inpatient appropriate because: Severity of illness   Consultants:  Neurosurgery: Dr. Fenton Malling, NP neurosurgery 05/13/2022  Procedures:  CT head 05/13/2022 CT chest abdomen and pelvis 05/15/2022 MRI brain 05/13/2022 CT BrainLab head 05/14/2022 Stereotactic brain biopsy application of cranial navigation procedure note per Dr. Marcello Moores neurosurgery 05/16/2022.   Antimicrobials:  None   Subjective: Patient sedated, just returned from PACU.  Moving right upper extremity and lower extremity spontaneously.  Objective: Vitals:   05/16/22 1350 05/16/22 1455 05/16/22 1545 05/16/22 1555  BP: (!) 106/57  117/68 126/64  Pulse: 76 78 (!) 59 78  Resp: '15 13 10 12  '$ Temp:  (!) 97 F (36.1 C)    TempSrc:      SpO2: 100% 98% 99% 100%  Weight:      Height:        Intake/Output Summary (Last 24 hours) at 05/16/2022 1743 Last data filed at 05/16/2022 1545 Gross per 24 hour  Intake 1500 ml  Output 645 ml  Net 855 ml   Filed Weights   05/15/22 0500 05/16/22 0300 05/16/22 1004  Weight: 62.7 kg 62.7 kg 62.7 kg  Examination:  General exam: Sedated Respiratory system: Clear to auscultation anterior lung fields.  No wheezes, no crackles, no rhonchi.Marland Kitchen Respiratory  effort normal. Cardiovascular system: S1 & S2 heard, RRR. No JVD, murmurs, rubs, gallops or clicks. No pedal edema. Gastrointestinal system: Abdomen is nondistended, soft and nontender. No organomegaly or masses felt. Normal bowel sounds heard. Central nervous system: Sedated.  Left upper extremity weakness.  Moving right upper extremity and lower extremities spontaneously.   Extremities: Left upper extremity weakness.  Moving right upper extremity and lower extremity spontaneously.  Skin: No rashes, lesions or ulcers Psychiatry: Judgement and insight unable to assess due to sedation.  Mood and affect unable to assess due to sedation.     Data Reviewed: I have personally reviewed following labs and imaging studies  CBC: Recent Labs  Lab 05/13/22 0017 05/15/22 0214 05/16/22 0238  WBC 7.1 13.0* 6.4  NEUTROABS 4.6  --   --   HGB 13.3 12.2 12.8  HCT 41.2 38.2 39.6  MCV 82.4 82.7 82.5  PLT 266 241 510    Basic Metabolic Panel: Recent Labs  Lab 05/13/22 0017 05/15/22 0214 05/16/22 0238  NA 141 142 142  K 4.0 3.3* 4.0  CL 106 105 106  CO2 '22 22 26  '$ GLUCOSE 126* 78 104*  BUN '14 12 9  '$ CREATININE 0.88 0.74 0.81  CALCIUM 10.6* 9.8 9.8  MG  --  2.2 2.0    GFR: Estimated Creatinine Clearance: 57.3 mL/min (by C-G formula based on SCr of 0.81 mg/dL).  Liver Function Tests: Recent Labs  Lab 05/13/22 0017  AST 27  ALT 22  ALKPHOS 51  BILITOT 1.7*  PROT 7.9  ALBUMIN 4.5    CBG: Recent Labs  Lab 05/14/22 1537 05/15/22 0801 05/15/22 1242 05/15/22 1654 05/16/22 0843  GLUCAP 91 89 106* 128* 120*     No results found for this or any previous visit (from the past 240 hour(s)).       Radiology Studies: CT CHEST ABDOMEN PELVIS W CONTRAST  Result Date: 05/15/2022 CLINICAL DATA:  Brain neoplasm. Question lymphoma. Staging. * Tracking Code: BO * EXAM: CT CHEST, ABDOMEN, AND PELVIS WITH CONTRAST TECHNIQUE: Multidetector CT imaging of the chest, abdomen and pelvis was  performed following the standard protocol during bolus administration of intravenous contrast. RADIATION DOSE REDUCTION: This exam was performed according to the departmental dose-optimization program which includes automated exposure control, adjustment of the mA and/or kV according to patient size and/or use of iterative reconstruction technique. CONTRAST:  21m OMNIPAQUE IOHEXOL 300 MG/ML  SOLN COMPARISON:  None Available. FINDINGS: CT CHEST FINDINGS Cardiovascular: The heart size is normal. No substantial pericardial effusion. Mediastinum/Nodes: No mediastinal lymphadenopathy. There is no hilar lymphadenopathy. The esophagus has normal imaging features. There is no axillary lymphadenopathy. Lungs/Pleura: 2 mm right lower lobe pulmonary nodule identified on 59/4. No suspicious pulmonary nodule or mass. No focal airspace consolidation. No pleural effusion. Dependent atelectasis noted in both lower lobes. Musculoskeletal: No worrisome lytic or sclerotic osseous abnormality. CT ABDOMEN PELVIS FINDINGS Hepatobiliary: 8 mm hypodensity in the left liver is too small to characterize but likely benign. There is no evidence for gallstones, gallbladder wall thickening, or pericholecystic fluid. No intrahepatic or extrahepatic biliary dilation. Pancreas: No focal mass lesion. No dilatation of the main duct. No intraparenchymal cyst. No peripancreatic edema. Spleen: No splenomegaly. No focal mass lesion. Adrenals/Urinary Tract: No adrenal nodule or mass. Kidneys unremarkable. No evidence for hydroureter. The urinary bladder appears normal for the degree of distention. Dependent  contrast in the bladder lumen compatible with infused head CT yesterday. Stomach/Bowel: Stomach is distended with fluid and gas. Duodenum is normally positioned as is the ligament of Treitz. No small bowel wall thickening. No small bowel dilatation. The terminal ileum is normal. The appendix is normal. No gross colonic mass. No colonic wall  thickening. Vascular/Lymphatic: No abdominal aortic aneurysm. No abdominal aortic atherosclerotic calcification. There is no gastrohepatic or hepatoduodenal ligament lymphadenopathy. No retroperitoneal or mesenteric lymphadenopathy. No pelvic sidewall lymphadenopathy. Reproductive: Unremarkable. Other: No intraperitoneal free fluid. Musculoskeletal: No worrisome lytic or sclerotic osseous abnormality. IMPRESSION: 1. No lymphadenopathy or other findings to suggest lymphoma in the chest, abdomen, or pelvis. 2. 2 mm right lower lobe pulmonary nodule. No follow-up needed if patient is low-risk.This recommendation follows the consensus statement: Guidelines for Management of Incidental Pulmonary Nodules Detected on CT Images: From the Fleischner Society 2017; Radiology 2017; 284:228-243. 3. 8 mm hypodensity in the left liver is too small to characterize but likely benign. Electronically Signed   By: Misty Stanley M.D.   On: 05/15/2022 12:42        Scheduled Meds:  carbamazepine  200 mg Oral BID   Chlorhexidine Gluconate Cloth  6 each Topical Daily   dexamethasone (DECADRON) injection  4 mg Intravenous Q6H   [START ON 05/17/2022] enoxaparin (LOVENOX) injection  40 mg Subcutaneous Q24H   pantoprazole (PROTONIX) IV  40 mg Intravenous QHS   simvastatin  20 mg Oral Daily   Continuous Infusions:   ceFAZolin (ANCEF) IV     levETIRAcetam 500 mg (05/15/22 2329)   niCARDipine       LOS: 3 days    Time spent: 40 minutes    Irine Seal, MD [facility]   To contact the attending provider between 7A-7P or the covering provider during after hours 7P-7A, please log into the web site [email or web address] and access using universal [place] password for that web site. If you do not have the password, please call the hospital operator.  05/16/2022, 5:43 PM

## 2022-05-16 NOTE — Op Note (Signed)
Procedure(s): STEREOTACTIC BRAIN BIOPSY APPLICATION OF CRANIAL NAVIGATION Procedure Note  April Saunders female 66 y.o. 05/16/2022  Procedure(s) and Anesthesia Type:    * STEREOTACTIC BRAIN BIOPSY - General    * APPLICATION OF CRANIAL NAVIGATION - General  Surgeon(s) and Role:    Marcello Moores, Dorcas Carrow, MD - Primary   Indications: This is a 66 yo F who presented with increased confusion and right sided weakness.  She was found to have a multifocal enhancing mass-like process in her brain.  Metastatic workup was negative.  Given suspicion for lymphoma or other CNS malignancy, stereotactic biopsy of her largest lesion centered in her right basal ganglia was recommended as other lesions were much smaller and would increase risk of nondiagnostic tissue sampling.  I discussed with the patient's daughter the general technique of surgery as well as risks, benefits, alternatives, and expected convalescence.  Informed consent was obtained.     Surgeon: Vallarie Mare   Assistants: None  Anesthesia: General endotracheal anesthesia  ASA Class: none   Procedure Detail  STEREOTACTIC BRAIN BIOPSY, APPLICATION OF CRANIAL NAVIGATION  Patient was brought to the operating room.  General anesthesia was induced and patient was intubated by the anesthesia service.  After appropriate lines and monitors were placed, patient positioned supine and her head was placed in a [place] head holder and affixed to the bed.  Using a preoperative thin cut CT merged with MRI, surface match registration was performed with the [place] system.  Entry and target of stereotactic biopsy was mapped out using the [place] system.  A small area of the scalp was clipped, preprepped with alcohol and prepped and draped in sterile fashion.  A timeout was performed.  Preoperative antibiotics were administered.  1% lidocaine with epinephrine was injected into the planned incision.  The very guide arm was locked along the planned  trajectory.  Small incision was made with a 15 blade and using the very guide with a reducing cannula, high-speed drill was used to create a twist drill hole in the skull.  The navigated brain needle was then passed into the target lesion.  Samples were taken at multiple rotations of the needle and sent for frozen section.  This returned as likely lymphoma.  No significant bleeding was encountered.  The needle was withdrawn.  The wound was irrigated thoroughly.  The skin was closed with 3-0 Vicryl Rapide with 2 interrupted stitches.  Bacitracin was placed on the wound.  Patient was liberated from the [place] head holder and turned over the anesthesia service.  It is unclear whether extubation would be attempted given her poor level of consciousness prior to surgery.  All counts were correct at the end of surgery.  No complications were noted.  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         Condition: stable

## 2022-05-16 NOTE — Anesthesia Postprocedure Evaluation (Signed)
Anesthesia Post Note  Patient: April Saunders  Procedure(s) Performed: STEREOTACTIC BRAIN BIOPSY (Right: Head) APPLICATION OF CRANIAL NAVIGATION (Right)     Patient location during evaluation: PACU Anesthesia Type: General Level of consciousness: awake and alert Pain management: pain level controlled Vital Signs Assessment: post-procedure vital signs reviewed and stable Respiratory status: spontaneous breathing, nonlabored ventilation, respiratory function stable and patient connected to nasal cannula oxygen Cardiovascular status: blood pressure returned to baseline and stable Postop Assessment: no apparent nausea or vomiting Anesthetic complications: no   No notable events documented.  Last Vitals:  Vitals:   05/16/22 1545 05/16/22 1555  BP: 117/68 126/64  Pulse: (!) 59 78  Resp: 10 12  Temp:    SpO2: 99% 100%    Last Pain:  Vitals:   05/16/22 1004  TempSrc: Oral  PainSc:                  March Rummage Cionna Collantes

## 2022-05-16 NOTE — Progress Notes (Signed)
Daughter at bedside and signed consent for patient. Daughter would like for MD to call and update her once surgery is finished. Thank you

## 2022-05-16 NOTE — Anesthesia Procedure Notes (Signed)
Arterial Line Insertion Start/End7/03/2022 10:45 AM Performed by: Janene Harvey, CRNA, CRNA  Patient location: Pre-op. Lidocaine 1% used for infiltration Left, radial was placed Catheter size: 20 G Hand hygiene performed  and maximum sterile barriers used  Allen's test indicative of satisfactory collateral circulation Attempts: 2 Procedure performed without using ultrasound guided technique. Following insertion, dressing applied and Biopatch. Post procedure assessment: unchanged  Patient tolerated the procedure well with no immediate complications.

## 2022-05-17 ENCOUNTER — Inpatient Hospital Stay (HOSPITAL_COMMUNITY): Payer: Medicare Other

## 2022-05-17 DIAGNOSIS — I10 Essential (primary) hypertension: Secondary | ICD-10-CM | POA: Diagnosis not present

## 2022-05-17 DIAGNOSIS — E785 Hyperlipidemia, unspecified: Secondary | ICD-10-CM | POA: Diagnosis not present

## 2022-05-17 DIAGNOSIS — E876 Hypokalemia: Secondary | ICD-10-CM | POA: Diagnosis not present

## 2022-05-17 DIAGNOSIS — D496 Neoplasm of unspecified behavior of brain: Secondary | ICD-10-CM | POA: Diagnosis not present

## 2022-05-17 LAB — CBC WITH DIFFERENTIAL/PLATELET
Abs Immature Granulocytes: 0.05 10*3/uL (ref 0.00–0.07)
Basophils Absolute: 0 10*3/uL (ref 0.0–0.1)
Basophils Relative: 0 %
Eosinophils Absolute: 0 10*3/uL (ref 0.0–0.5)
Eosinophils Relative: 0 %
HCT: 37.2 % (ref 36.0–46.0)
Hemoglobin: 12 g/dL (ref 12.0–15.0)
Immature Granulocytes: 1 %
Lymphocytes Relative: 8 %
Lymphs Abs: 0.6 10*3/uL — ABNORMAL LOW (ref 0.7–4.0)
MCH: 26.5 pg (ref 26.0–34.0)
MCHC: 32.3 g/dL (ref 30.0–36.0)
MCV: 82.1 fL (ref 80.0–100.0)
Monocytes Absolute: 0.2 10*3/uL (ref 0.1–1.0)
Monocytes Relative: 3 %
Neutro Abs: 7.1 10*3/uL (ref 1.7–7.7)
Neutrophils Relative %: 88 %
Platelets: 216 10*3/uL (ref 150–400)
RBC: 4.53 MIL/uL (ref 3.87–5.11)
RDW: 14.1 % (ref 11.5–15.5)
WBC: 8 10*3/uL (ref 4.0–10.5)
nRBC: 0 % (ref 0.0–0.2)

## 2022-05-17 LAB — BASIC METABOLIC PANEL
Anion gap: 11 (ref 5–15)
BUN: 8 mg/dL (ref 8–23)
CO2: 24 mmol/L (ref 22–32)
Calcium: 9.5 mg/dL (ref 8.9–10.3)
Chloride: 104 mmol/L (ref 98–111)
Creatinine, Ser: 0.75 mg/dL (ref 0.44–1.00)
GFR, Estimated: >60 mL/min (ref >60)
Glucose, Bld: 154 mg/dL — ABNORMAL HIGH (ref 70–99)
Potassium: 3.8 mmol/L (ref 3.5–5.1)
Sodium: 139 mmol/L (ref 135–145)

## 2022-05-17 MED ORDER — DEXAMETHASONE SODIUM PHOSPHATE 10 MG/ML IJ SOLN
6.0000 mg | Freq: Four times a day (QID) | INTRAMUSCULAR | Status: DC
  Administered 2022-05-17 – 2022-05-21 (×16): 6 mg via INTRAVENOUS
  Filled 2022-05-17 (×17): qty 1

## 2022-05-17 MED FILL — Thrombin For Soln 5000 Unit: CUTANEOUS | Qty: 2 | Status: AC

## 2022-05-17 NOTE — Plan of Care (Signed)
  Problem: Education: Goal: Knowledge of General Education information will improve Description: Including pain rating scale, medication(s)/side effects and non-pharmacologic comfort measures Outcome: Progressing   Problem: Health Behavior/Discharge Planning: Goal: Ability to manage health-related needs will improve Outcome: Progressing   Problem: Clinical Measurements: Goal: Ability to maintain clinical measurements within normal limits will improve Outcome: Progressing   Problem: Activity: Goal: Risk for activity intolerance will decrease Outcome: Progressing   Problem: Nutrition: Goal: Adequate nutrition will be maintained Outcome: Progressing   Problem: Education: Goal: Expressions of having a comfortable level of knowledge regarding the disease process will increase Outcome: Progressing   Problem: Coping: Goal: Ability to adjust to condition or change in health will improve Outcome: Progressing   Problem: Health Behavior/Discharge Planning: Goal: Compliance with prescribed medication regimen will improve Outcome: Progressing   Problem: Medication: Goal: Risk for medication side effects will decrease Outcome: Progressing   Problem: Clinical Measurements: Goal: Complications related to the disease process, condition or treatment will be avoided or minimized Outcome: Progressing   Problem: Safety: Goal: Verbalization of understanding the information provided will improve Outcome: Progressing   Problem: Self-Concept: Goal: Level of anxiety will decrease Outcome: Progressing

## 2022-05-17 NOTE — Progress Notes (Signed)
Subjective: Patient reports lying comfortably in bed in NAD. She is lethargic. No acute events overnight.   Objective: Vital signs in last 24 hours: Temp:  [97 F (36.1 C)-99.2 F (37.3 C)] 97.1 F (36.2 C) (07/06 0800) Pulse Rate:  [53-80] 53 (07/06 0800) Resp:  [9-17] 11 (07/06 0800) BP: (88-146)/(53-85) 121/64 (07/06 0700) SpO2:  [95 %-100 %] 97 % (07/06 0800) Arterial Line BP: (83-169)/(55-87) 102/61 (07/06 0800) Weight:  [61.1 kg] 61.1 kg (07/06 0500)  Intake/Output from previous day: 07/05 0701 - 07/06 0700 In: 1699.8 [I.V.:1400; IV Piggyback:299.8] Out: 1720 [Urine:1700; Blood:20] Intake/Output this shift: No intake/output data recorded.  Physical Exam: Patient is not alert but will arouse to voice. She is nonverbal except for intermittent groans. No spontaneous eye opening. Follows commands consistently in her RUE and RLE. RUE and RLE moves antigravity. LUE 2/5, LLE 3/5. PERLA. Incision is well approximated with no drainage, erythema, or edema.      Lab Results: Recent Labs    05/16/22 0238 05/17/22 0439  WBC 6.4 8.0  HGB 12.8 12.0  HCT 39.6 37.2  PLT 222 216   BMET Recent Labs    05/16/22 0238 05/17/22 0439  NA 142 139  K 4.0 3.8  CL 106 104  CO2 26 24  GLUCOSE 104* 154*  BUN 9 8  CREATININE 0.81 0.75  CALCIUM 9.8 9.5    Studies/Results: CT HEAD WO CONTRAST  Result Date: 05/17/2022 CLINICAL DATA:  CNS neoplasm follow-up EXAM: CT HEAD WITHOUT CONTRAST TECHNIQUE: Contiguous axial images were obtained from the base of the skull through the vertex without intravenous contrast. RADIATION DOSE REDUCTION: This exam was performed according to the departmental dose-optimization program which includes automated exposure control, adjustment of the mA and/or kV according to patient size and/or use of iterative reconstruction technique. COMPARISON:  Head CT from 3 days ago FINDINGS: Brain: 2.3 cm high-density mass centered at the right basal ganglia with expected  superimposed blood products and gas after biopsy. Continued profound regional edema with midline shift measuring 7 mm, increased. There may be developing entrapment of the left lateral ventricle, but no measurable change from prior CT. No cortical infarct or extra-axial collection noted. Underestimated parenchymal masses when compared to prior postcontrast imaging. Vascular: No hyperdense vessel or unexpected calcification. Skull: Normal. Negative for fracture or focal lesion. Sinuses/Orbits: No acute finding. IMPRESSION: Expected blood products and gas around the right basal ganglia mass after biopsy. Continued pronounced vasogenic edema, midline shift has increased to 7 mm. Electronically Signed   By: Jorje Guild M.D.   On: 05/17/2022 05:34    Assessment/Plan: 66 y.o. female who POD#1 s/p stereotactic brain biopsy. She initially presented to the ED with c/o progressively worsening fatigue, generalized weakness, and balance abnormalities. Postop CT head this morning revealed stable and expected appearance without any acute findings. MLS approximately 23m. She is lethargic and following commands on the right only. Continue supportive care.     -Keppra 500 mg BID     LOS: 4 days     JMarvis Moeller DNP, AGNP-C Neurosurgery Nurse Practitioner  [facility] [id number], [place] [id number]: [phone]   F: [phone] 05/17/2022 10:56 AM

## 2022-05-17 NOTE — Progress Notes (Signed)
PROGRESS NOTE    April Saunders  HAL:[id number] DOB: 01/29/1956 DOA: 05/13/2022 PCP: Willene Hatchet, NP    Chief Complaint  Patient presents with   Weakness    Brief Narrative:  April Saunders is a 66 y.o. female with medical history significant of hypertension, hyperlipidemia, degenerative joint disease, obesity presented hospital with fatigue, weakness for 3 to 4 weeks with difficulty balancing himself  and falls.  In the ED, patient had left upper extremity deficit.  Further work-up in the ED showed tumor in the right deep gray matter with extensive vasogenic edema and possible lesion in the right cerebellum. Case was discussed with  neurosurgery.  Patient was then initiated on dexamethasone and Keppra and was admitted to the hospital for further evaluation and treatment.    After hospitalization, patient has been followed by neurosurgery.  CT scan of the brain brain protocol showed enhancing lesions in the brain.  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 -MRI brain showed multifocal infiltrative and hypercellular enhancing brain masses involving both cerebral hemispheres, brainstem and cerebellum with concerns for CNS lymphoma.  Extensive brain and brainstem edema with midline shift of 7 mm noted. 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 -CT abdomen chest and pelvis with no findings of lymphadenopathy suggest suggest lymphoma. -Patient seen in  consultation by neurosurgery who recommended Decadron and Keppra. -Patient subsequently underwent stereotactic brain biopsy per neurosurgery, Dr. Marcello Moores, 05/16/2022. -Repeat CT done 05/17/2022 with expected blood products and gas around the right basal ganglia mass after biopsy.  Continued pronounced vasogenic edema, midline shift has increased to 7 mm. -We will defer oncology consultation to neurosurgery. -Supportive care. -Per neurosurgery.  2.  Hyperlipidemia -Continue statin.  3.  Mild hypokalemia -Patient noted to be on chronic potassium supplementation prior to admission. -Potassium repleted currently at 3.8., magnesium at 2.0.  4.  Hypertension -BP improving.  Continue to hold antihypertensive medication of HCTZ and amlodipine.   -Could likely resume antihypertensive medications tomorrow if continued elevated blood pressure.   5.  History of trigeminal neuralgia -Continue home regimen carbamazepine   DVT prophylaxis: Lovenox>>>> SCDs Code Status: Full Family Communication: Updated patient.  No family at bedside. Disposition: TBD  Status is: Inpatient Remains inpatient appropriate because: Severity of illness   Consultants:  Neurosurgery: Dr. Fenton Malling, NP neurosurgery 05/13/2022  Procedures:  CT head 05/13/2022, 05/17/2022 CT chest abdomen and pelvis 05/15/2022 MRI brain 05/13/2022 CT BrainLab head 05/14/2022 Stereotactic brain biopsy application of cranial navigation procedure note per Dr. Marcello Moores neurosurgery 05/16/2022.   Antimicrobials:  None   Subjective: Patient awake this morning, alert answering questions appropriately.  Denies any chest pain.  No shortness of breath.  No abdominal pain.  Still with some left-sided neglect.    Objective: Vitals:   05/17/22 0600 05/17/22 0604 05/17/22 0700 05/17/22 0800  BP:  124/70 121/64   Pulse: (!) 58 (!) 55 (!) 56 (!) 53  Resp: 14 12 (!) 9 11  Temp:    (!) 97.1 F (36.2 C)  TempSrc:  Axillary  SpO2: 97% 99% 97% 97%   Weight:      Height:        Intake/Output Summary (Last 24 hours) at 05/17/2022 1008 Last data filed at 05/17/2022 0500 Gross per 24 hour  Intake 1699.84 ml  Output 1720 ml  Net -20.16 ml    Filed Weights   05/16/22 0300 05/16/22 1004 05/17/22 0500  Weight: 62.7 kg 62.7 kg 61.1 kg    Examination:  General exam: Eyes closed but alert, following commands. Respiratory system: Lungs clear to auscultation bilaterally anterior lung fields.  No wheezes, no crackles, no rhonchi.  Fair air movement.  Speaking in full sentences.   Cardiovascular system: Regular rate rhythm no murmurs rubs or gallops.  No JVD.  No lower extremity edema.  Gastrointestinal system: Abdomen is soft, nontender, nondistended, positive bowel sounds.  No rebound.  No guarding. Central nervous system: Alert.  Following commands..  Left upper extremity weakness.  Moving right upper extremity and lower extremities spontaneously.   Extremities: Left upper extremity weakness.  Moving right upper extremity and lower extremity spontaneously.  Skin: No rashes, lesions or ulcers Psychiatry: Judgement and insight appear fair.  Mood and affect appropriate.    Data Reviewed: I have personally reviewed following labs and imaging studies  CBC: Recent Labs  Lab 05/13/22 0017 05/15/22 0214 05/16/22 0238 05/17/22 0439  WBC 7.1 13.0* 6.4 8.0  NEUTROABS 4.6  --   --  7.1  HGB 13.3 12.2 12.8 12.0  HCT 41.2 38.2 39.6 37.2  MCV 82.4 82.7 82.5 82.1  PLT 266 241 222 216     Basic Metabolic Panel: Recent Labs  Lab 05/13/22 0017 05/15/22 0214 05/16/22 0238 05/17/22 0439  NA 141 142 142 139  K 4.0 3.3* 4.0 3.8  CL 106 105 106 104  CO2 '22 22 26 24  '$ GLUCOSE 126* 78 104* 154*  BUN '14 12 9 8  '$ CREATININE 0.88 0.74 0.81 0.75  CALCIUM 10.6* 9.8 9.8 9.5  MG  --  2.2 2.0  --      GFR: Estimated Creatinine Clearance: 58 mL/min (by C-G formula based on SCr of 0.75 mg/dL).  Liver Function Tests: Recent Labs  Lab  05/13/22 0017  AST 27  ALT 22  ALKPHOS 51  BILITOT 1.7*  PROT 7.9  ALBUMIN 4.5     CBG: Recent Labs  Lab 05/14/22 1537 05/15/22 0801 05/15/22 1242 05/15/22 1654 05/16/22 0843  GLUCAP 91 89 106* 128* 120*      No results found for this or any previous visit (from the past 240 hour(s)).       Radiology Studies: CT HEAD WO CONTRAST  Result Date: 05/17/2022 CLINICAL DATA:  CNS neoplasm follow-up EXAM: CT HEAD WITHOUT CONTRAST TECHNIQUE: Contiguous axial images were obtained from the base of the skull through the vertex without intravenous contrast. RADIATION DOSE REDUCTION: This exam was performed according to the departmental dose-optimization program which includes automated exposure control, adjustment of the mA and/or kV according to patient size and/or use of iterative reconstruction technique. COMPARISON:  Head CT from 3 days ago FINDINGS: Brain: 2.3 cm high-density mass centered at the right basal ganglia with expected superimposed blood products and gas after biopsy. Continued profound regional edema with midline shift measuring 7 mm, increased. There may be developing entrapment of the left lateral ventricle, but no measurable change from prior CT. No cortical infarct or extra-axial collection noted. Underestimated parenchymal masses when compared to prior postcontrast imaging. Vascular: No hyperdense vessel or  unexpected calcification. Skull: Normal. Negative for fracture or focal lesion. Sinuses/Orbits: No acute finding. IMPRESSION: Expected blood products and gas around the right basal ganglia mass after biopsy. Continued pronounced vasogenic edema, midline shift has increased to 7 mm. Electronically Signed   By: Jorje Guild M.D.   On: 05/17/2022 05:34   CT CHEST ABDOMEN PELVIS W CONTRAST  Result Date: 05/15/2022 CLINICAL DATA:  Brain neoplasm. Question lymphoma. Staging. * Tracking Code: BO * EXAM: CT CHEST, ABDOMEN, AND PELVIS WITH CONTRAST TECHNIQUE: Multidetector CT  imaging of the chest, abdomen and pelvis was performed following the standard protocol during bolus administration of intravenous contrast. RADIATION DOSE REDUCTION: This exam was performed according to the departmental dose-optimization program which includes automated exposure control, adjustment of the mA and/or kV according to patient size and/or use of iterative reconstruction technique. CONTRAST:  59m OMNIPAQUE IOHEXOL 300 MG/ML  SOLN COMPARISON:  None Available. FINDINGS: CT CHEST FINDINGS Cardiovascular: The heart size is normal. No substantial pericardial effusion. Mediastinum/Nodes: No mediastinal lymphadenopathy. There is no hilar lymphadenopathy. The esophagus has normal imaging features. There is no axillary lymphadenopathy. Lungs/Pleura: 2 mm right lower lobe pulmonary nodule identified on 59/4. No suspicious pulmonary nodule or mass. No focal airspace consolidation. No pleural effusion. Dependent atelectasis noted in both lower lobes. Musculoskeletal: No worrisome lytic or sclerotic osseous abnormality. CT ABDOMEN PELVIS FINDINGS Hepatobiliary: 8 mm hypodensity in the left liver is too small to characterize but likely benign. There is no evidence for gallstones, gallbladder wall thickening, or pericholecystic fluid. No intrahepatic or extrahepatic biliary dilation. Pancreas: No focal mass lesion. No dilatation of the main duct. No intraparenchymal cyst. No peripancreatic edema. Spleen: No splenomegaly. No focal mass lesion. Adrenals/Urinary Tract: No adrenal nodule or mass. Kidneys unremarkable. No evidence for hydroureter. The urinary bladder appears normal for the degree of distention. Dependent contrast in the bladder lumen compatible with infused head CT yesterday. Stomach/Bowel: Stomach is distended with fluid and gas. Duodenum is normally positioned as is the ligament of Treitz. No small bowel wall thickening. No small bowel dilatation. The terminal ileum is normal. The appendix is normal. No  gross colonic mass. No colonic wall thickening. Vascular/Lymphatic: No abdominal aortic aneurysm. No abdominal aortic atherosclerotic calcification. There is no gastrohepatic or hepatoduodenal ligament lymphadenopathy. No retroperitoneal or mesenteric lymphadenopathy. No pelvic sidewall lymphadenopathy. Reproductive: Unremarkable. Other: No intraperitoneal free fluid. Musculoskeletal: No worrisome lytic or sclerotic osseous abnormality. IMPRESSION: 1. No lymphadenopathy or other findings to suggest lymphoma in the chest, abdomen, or pelvis. 2. 2 mm right lower lobe pulmonary nodule. No follow-up needed if patient is low-risk.This recommendation follows the consensus statement: Guidelines for Management of Incidental Pulmonary Nodules Detected on CT Images: From the Fleischner Society 2017; Radiology 2017; 284:228-243. 3. 8 mm hypodensity in the left liver is too small to characterize but likely benign. Electronically Signed   By: EMisty StanleyM.D.   On: 05/15/2022 12:42        Scheduled Meds:  carbamazepine  200 mg Oral BID   Chlorhexidine Gluconate Cloth  6 each Topical Daily   dexamethasone (DECADRON) injection  4 mg Intravenous Q6H   pantoprazole (PROTONIX) IV  40 mg Intravenous QHS   simvastatin  20 mg Oral Daily   Continuous Infusions:   ceFAZolin (ANCEF) IV Stopped (05/17/22 0416)   levETIRAcetam Stopped (05/17/22 0007)   niCARDipine       LOS: 4 days    Time spent: 40 minutes    DIrine Seal MD [facility]   To  contact the attending provider between 7A-7P or the covering provider during after hours 7P-7A, please log into the web site [email or web address] and access using universal [place] password for that web site. If you do not have the password, please call the hospital operator.  05/17/2022, 10:08 AM

## 2022-05-17 NOTE — Evaluation (Signed)
Clinical/Bedside Swallow Evaluation Patient Details  Name: April Saunders MRN: [id number] Date of Birth: [birth year]  Today's Date: 05/17/2022 Time: SLP Start Time (ACUTE ONLY): 6269 SLP Stop Time (ACUTE ONLY): 1600 SLP Time Calculation (min) (ACUTE ONLY): 13 min  Past Medical History:  Past Medical History:  Diagnosis Date   Abnormal MRI    brain   DJD (degenerative joint disease)    multiple joints   Hyperlipidemia    Hypertension    Lipoma    rt ankle   Obesity    Trigeminal neuralgia 07/05/2021   Past Surgical History:  Past Surgical History:  Procedure Laterality Date   ABDOMINAL HYSTERECTOMY     ? ovaries   APPLICATION OF CRANIAL NAVIGATION Right 05/16/2022   Procedure: APPLICATION OF CRANIAL NAVIGATION;  Surgeon: Vallarie Mare, MD;  Location: [facility];  Service: Neurosurgery;  Laterality: Right;   BRAIN BIOPSY Right 05/16/2022   Procedure: STEREOTACTIC BRAIN BIOPSY;  Surgeon: Vallarie Mare, MD;  Location: [facility];  Service: Neurosurgery;  Laterality: Right;   COLONOSCOPY     HPI:  Pt is a 66 y.o. female who presented with fatigue, weakness for 3 to 4 weeks with progression of generalized weakness , and diffuclty with balance. In the ED, patient had left upper extremity deficit. MRI brain 7/2: Multifocal, but also somewhat infiltrative and hypercellular enhancing brain masses involving both cerebral hemispheres, the brainstem, and the right cerebellum. Neurosurgery recommended Decadron and Keppra. Pt s/p  stereotactic brain biopsy 7/5. PMH: HLD, HTN, DJD, obesity, trigeminal neuralgia.    Assessment / Plan / Recommendation  Clinical Impression  Pt was seen for bedside swallow evaluation. Pt's reliability as a historian is questioned due to the variability of her responses. Alertness was adequate for p.o. trials, but suboptimal. Oral mechanism exam was [facility] and she presented with adequate, natural dentition. She exhibited symptoms of pharyngeal dysphagia characterized by throat  clearing and/or coughing with thin liquids via cup and straw. Solid trials were limited to purees since pt refused more advanced trials, but oral clearance was adequate and symptoms of pharyngeal residue not observed. The impact of pt's lethargy on her overall performance is considered. A dysphagia 1 diet with thin liquids will be initiated at this time. SLP will follow for diet advancement and/or instrumental assessment as clinically indicated. SLP Visit Diagnosis: Dysphagia, unspecified (R13.10)    Aspiration Risk  Mild aspiration risk    Diet Recommendation Dysphagia 1 (Puree);Nectar-thick liquid   Liquid Administration via: Cup;Straw Medication Administration: Whole meds with puree Supervision: Staff to assist with self feeding Compensations: Slow rate;Small sips/bites Postural Changes: Seated upright at 90 degrees    Other  Recommendations Oral Care Recommendations: Oral care BID    Recommendations for follow up therapy are one component of a multi-disciplinary discharge planning process, led by the attending physician.  Recommendations may be updated based on patient status, additional functional criteria and insurance authorization.  Follow up Recommendations  (Continued SLP services at level of care recommended by PT/OT)      Assistance Recommended at Discharge Frequent or constant Supervision/Assistance  Functional Status Assessment Patient has had a recent decline in their functional status and demonstrates the ability to make significant improvements in function in a reasonable and predictable amount of time.  Frequency and Duration min 2x/week  2 weeks       Prognosis Prognosis for Safe Diet Advancement: Good Barriers to Reach Goals: Cognitive deficits (lethargy)      Swallow Study   General Date of  Onset: 05/16/22 HPI: Pt is a 66 y.o. female who presented with fatigue, weakness for 3 to 4 weeks with progression of generalized weakness , and diffuclty with balance. In  the ED, patient had left upper extremity deficit. MRI brain 7/2: Multifocal, but also somewhat infiltrative and hypercellular enhancing brain masses involving both cerebral hemispheres, the brainstem, and the right cerebellum. Neurosurgery recommended Decadron and Keppra. Pt s/p  stereotactic brain biopsy 7/5. PMH: HLD, HTN, DJD, obesity, trigeminal neuralgia. Type of Study: Bedside Swallow Evaluation Previous Swallow Assessment: none Diet Prior to this Study: NPO Temperature Spikes Noted: No Respiratory Status: Room air History of Recent Intubation: No Behavior/Cognition: Alert;Cooperative;Pleasant mood Oral Cavity Assessment: Within Functional Limits Oral Care Completed by SLP: No Oral Cavity - Dentition: Adequate natural dentition Vision: Functional for self-feeding Self-Feeding Abilities: Needs assist Patient Positioning: Upright in bed;Postural control adequate for testing Baseline Vocal Quality:  (limited verbal output) Volitional Cough: Cognitively unable to elicit Volitional Swallow: Unable to elicit    Oral/Motor/Sensory Function Overall Oral Motor/Sensory Function: Within functional limits   Ice Chips Ice chips: Within functional limits Presentation: Spoon   Thin Liquid Thin Liquid: Impaired Presentation: Straw;Cup Pharyngeal  Phase Impairments: Cough - Immediate;Cough - Delayed;Throat Clearing - Immediate    Nectar Thick Nectar Thick Liquid: Within functional limits Presentation: Straw   Honey Thick Honey Thick Liquid: Not tested   Puree Puree: Within functional limits Presentation: Spoon   Solid     Solid: Not tested     Walters I. Hardin Negus, [place], [place] Office number [phone] Pager [place] 05/17/2022,4:12 PM

## 2022-05-17 NOTE — Progress Notes (Signed)
  Transition of Care [facility]) Screening Note   Patient Details  Name: April Saunders Date of Birth: [birth year]   [facility] of [facility]) CM/SW Contact:    Ella Bodo, RN Phone Number: 05/17/2022, 5:19 PM    Transition of Care Department [facility]) has reviewed patient and no TOC needs have been identified at this time. We will continue to monitor patient advancement through interdisciplinary progression rounds. If new patient transition needs arise, please place a TOC consult.  Reinaldo Raddle, RN, BSN  Trauma/Neuro ICU Case Manager [phone]

## 2022-05-18 DIAGNOSIS — I10 Essential (primary) hypertension: Secondary | ICD-10-CM | POA: Diagnosis not present

## 2022-05-18 DIAGNOSIS — E785 Hyperlipidemia, unspecified: Secondary | ICD-10-CM | POA: Diagnosis not present

## 2022-05-18 DIAGNOSIS — E876 Hypokalemia: Secondary | ICD-10-CM | POA: Diagnosis not present

## 2022-05-18 DIAGNOSIS — D496 Neoplasm of unspecified behavior of brain: Secondary | ICD-10-CM | POA: Diagnosis not present

## 2022-05-18 LAB — BASIC METABOLIC PANEL
Anion gap: 11 (ref 5–15)
BUN: 13 mg/dL (ref 8–23)
CO2: 25 mmol/L (ref 22–32)
Calcium: 9.7 mg/dL (ref 8.9–10.3)
Chloride: 106 mmol/L (ref 98–111)
Creatinine, Ser: 0.88 mg/dL (ref 0.44–1.00)
GFR, Estimated: >60 mL/min (ref >60)
Glucose, Bld: 144 mg/dL — ABNORMAL HIGH (ref 70–99)
Potassium: 4.2 mmol/L (ref 3.5–5.1)
Sodium: 142 mmol/L (ref 135–145)

## 2022-05-18 LAB — CBC WITH DIFFERENTIAL/PLATELET
Abs Immature Granulocytes: 0.06 10*3/uL (ref 0.00–0.07)
Basophils Absolute: 0 10*3/uL (ref 0.0–0.1)
Basophils Relative: 0 %
Eosinophils Absolute: 0 10*3/uL (ref 0.0–0.5)
Eosinophils Relative: 0 %
HCT: 37.4 % (ref 36.0–46.0)
Hemoglobin: 12.4 g/dL (ref 12.0–15.0)
Immature Granulocytes: 1 %
Lymphocytes Relative: 6 %
Lymphs Abs: 0.8 10*3/uL (ref 0.7–4.0)
MCH: 26.6 pg (ref 26.0–34.0)
MCHC: 33.2 g/dL (ref 30.0–36.0)
MCV: 80.3 fL (ref 80.0–100.0)
Monocytes Absolute: 0.5 10*3/uL (ref 0.1–1.0)
Monocytes Relative: 4 %
Neutro Abs: 11.5 10*3/uL — ABNORMAL HIGH (ref 1.7–7.7)
Neutrophils Relative %: 89 %
Platelets: 217 10*3/uL (ref 150–400)
RBC: 4.66 MIL/uL (ref 3.87–5.11)
RDW: 14.2 % (ref 11.5–15.5)
WBC: 12.8 10*3/uL — ABNORMAL HIGH (ref 4.0–10.5)
nRBC: 0 % (ref 0.0–0.2)

## 2022-05-18 LAB — SURGICAL PATHOLOGY

## 2022-05-18 LAB — GLUCOSE, CAPILLARY: Glucose-Capillary: 153 mg/dL — ABNORMAL HIGH (ref 70–99)

## 2022-05-18 LAB — MAGNESIUM: Magnesium: 2 mg/dL (ref 1.7–2.4)

## 2022-05-18 MED ORDER — SODIUM CHLORIDE 0.9 % IV SOLN
INTRAVENOUS | Status: DC
Start: 2022-05-18 — End: 2022-05-21

## 2022-05-18 MED ORDER — PANTOPRAZOLE SODIUM 40 MG PO TBEC
40.0000 mg | DELAYED_RELEASE_TABLET | Freq: Every day | ORAL | Status: DC
  Administered 2022-05-18 – 2022-05-21 (×4): 40 mg via ORAL
  Filled 2022-05-18 (×4): qty 1

## 2022-05-18 MED ORDER — SODIUM CHLORIDE 0.9 % IV BOLUS
250.0000 mL | Freq: Once | INTRAVENOUS | Status: AC
  Administered 2022-05-18: 250 mL via INTRAVENOUS

## 2022-05-18 NOTE — Progress Notes (Signed)
Pt noted to have low auto BP's along MAPs less than 65. Manual BP gather w/ BP 115/58. Dr. Ellene Route paged to inform. Pt w/ neuro change.

## 2022-05-18 NOTE — Progress Notes (Addendum)
Spoke w/ Dr. Ellene Route. New order for 250 NS bolus and conti NS at 100 cc/hr. Also to inform if pt starts to decline further BP, neuro, etc.

## 2022-05-18 NOTE — Evaluation (Signed)
Speech Language Pathology Evaluation Patient Details Name: April Saunders MRN: [id number] DOB: [birth year] Today's Date: 05/18/2022 Time: 1761-6073 SLP Time Calculation (min) (ACUTE ONLY): 18 min  Problem List:  Patient Active Problem List   Diagnosis Date Noted   Hypokalemia 05/15/2022   Brain tumor ([place]) 05/13/2022   Trigeminal neuralgia 07/05/2021   Prediabetes 07/06/2020   Diverticulosis 07/06/2020   Dyslipidemia 12/30/2018   Hypertension    Obesity    Past Medical History:  Past Medical History:  Diagnosis Date   Abnormal MRI    brain   DJD (degenerative joint disease)    multiple joints   Hyperlipidemia    Hypertension    Lipoma    rt ankle   Obesity    Trigeminal neuralgia 07/05/2021   Past Surgical History:  Past Surgical History:  Procedure Laterality Date   ABDOMINAL HYSTERECTOMY     ? ovaries   APPLICATION OF CRANIAL NAVIGATION Right 05/16/2022   Procedure: APPLICATION OF CRANIAL NAVIGATION;  Surgeon: Vallarie Mare, MD;  Location: [place];  Service: Neurosurgery;  Laterality: Right;   BRAIN BIOPSY Right 05/16/2022   Procedure: STEREOTACTIC BRAIN BIOPSY;  Surgeon: Vallarie Mare, MD;  Location: [place];  Service: Neurosurgery;  Laterality: Right;   COLONOSCOPY     HPI:  Pt is a 66 y.o. female who presented with fatigue, weakness for 3 to 4 weeks with progression of generalized weakness , and diffuclty with balance. In the ED, patient had left upper extremity deficit. MRI brain 7/2: Multifocal, but also somewhat infiltrative and hypercellular enhancing brain masses involving both cerebral hemispheres, the brainstem, and the right cerebellum. Neurosurgery recommended Decadron and Keppra. Pt s/p  stereotactic brain biopsy 7/5. PMH: HLD, HTN, DJD, obesity, trigeminal neuralgia.   Assessment / Plan / Recommendation Clinical Impression  Pt participated in speech-language-cognition evaluation. 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 She initially denied any acute changes in communication or cognition, but at the end of the evaluation she stated that the tasks completed felt more difficulty than she believes she would have been prior. The [facility] Mental Status Examination was completed to evaluate the pt's cognitive-linguistic skills. She achieved a score of 10/30 which is below the normal limits of 27 or more out of 30. She exhibited deficits in the areas of awareness, temporal orientation, attention, memory, complex problem solving, and executive function. Articulatory precision was adequate, but vocal intensity was reduced and negatively impacted speech intelligibility. Pt's receptive and expressive language skills were WFL. However, she presented with apragmatism characterized by difficulty with inference, reduced eye contact, reduced verbal output, and impairments in global coherence as well as cohesion. Skilled SLP services are clinically indicated at this time.    SLP Assessment  SLP Recommendation/Assessment: Patient needs continued Speech [facility] SLP Visit Diagnosis: Cognitive communication deficit (R41.841)    Recommendations for follow up therapy are one component of a multi-disciplinary discharge planning process, led by the attending physician.  Recommendations may be updated based on patient status, additional functional criteria and insurance authorization.    Follow Up Recommendations   (Continued SLP services at level of care recommended by PT/OT)    Assistance Recommended at Discharge  Frequent or constant Supervision/Assistance  Functional Status Assessment Patient has had a recent decline in their functional status and demonstrates the ability to make significant improvements in function in a reasonable and predictable amount of time.  Frequency and  Duration min 2x/week  2 weeks      SLP Evaluation Cognition  Overall Cognitive  Status: Impaired/Different from baseline Arousal/Alertness: Awake/alert Orientation Level: Oriented to person;Disoriented to place;Disoriented to time Year: 2023 Month: July Day of Week: Incorrect Attention: Focused;Sustained Focused Attention: Impaired Focused Attention Impairment: Verbal complex Sustained Attention: Impaired Sustained Attention Impairment: Verbal basic Memory: Impaired Memory Impairment:  (Immediate: 5/5 with repetition x3; delayed: 3/5; with cues: 1/2) Awareness: Impaired Awareness Impairment: Intellectual impairment Problem Solving: Impaired Problem Solving Impairment: Verbal complex (Money: 0/3; time: 0/1) Executive Function: Organizing;Sequencing Sequencing: Impaired (clock: 0/4) Organizing: Impaired Organizing Impairment: Verbal complex (backward digit span: 0/2)       Comprehension  Auditory Comprehension Overall Auditory Comprehension: Appears within functional limits for tasks assessed Yes/No Questions: Within Functional Limits Commands: Within Functional Limits    Expression Expression Primary Mode of Expression: Verbal Verbal Expression Initiation: No impairment Level of Generative/Spontaneous Verbalization: Conversation Repetition: No impairment Naming: Impairment Divergent:  (2 in 1 minute) Pragmatics: Impairment Impairments: Topic appropriateness;Topic maintenance;Eye contact Interfering Components: Attention   Oral / Motor  Oral Motor/Sensory Function Overall Oral Motor/Sensory Function: Within functional limits Motor Speech Respiration: Within functional limits Phonation: Low vocal intensity Resonance: Within functional limits Articulation: Within functional limitis Intelligibility: Intelligibility reduced Word: 75-100% accurate Phrase: 75-100% accurate Sentence: 50-74% accurate Conversation: 50-74% accurate Motor Planning: Witnin functional limits Motor Speech Errors: Aware           Kree Armato I. Hardin Negus, [place], [place] Office number [phone] Pager [place] 05/18/2022, 10:25 AM

## 2022-05-18 NOTE — Progress Notes (Signed)
Speech Language Pathology Treatment: Dysphagia  Patient Details Name: April Saunders MRN: [id number] DOB: Oct 22, 1956 Today's Date: 05/18/2022 Time: 2800-3491 SLP Time Calculation (min) (ACUTE ONLY): 13 min  Assessment / Plan / Recommendation Clinical Impression  Pt was seen for dysphagia treatment. She was cooperative during the session with improvement in alertness and verbal output. Pt tolerated puree, dysphagia 3 solids, and thin liquids via straw using individual and consecutive swallows without overt s/sx of aspiration. Brief oral holding was noted with purees, but cues were not necessary for swallowing and oral clearance was adequate. Pt's diet will be advanced to dysphagia 3 and thin liquids. SLP will continue to follow pt.     HPI HPI: Pt is a 66 y.o. female who presented with fatigue, weakness for 3 to 4 weeks with progression of generalized weakness , and diffuclty with balance. In the ED, patient had left upper extremity deficit. MRI brain 7/2: Multifocal, but also somewhat infiltrative and hypercellular enhancing brain masses involving both cerebral hemispheres, the brainstem, and the right cerebellum. Neurosurgery recommended Decadron and Keppra. Pt s/p  stereotactic brain biopsy 7/5. PMH: HLD, HTN, DJD, obesity, trigeminal neuralgia.      SLP Plan  Continue with current plan of care      Recommendations for follow up therapy are one component of a multi-disciplinary discharge planning process, led by the attending physician.  Recommendations may be updated based on patient status, additional functional criteria and insurance authorization.    Recommendations  Diet recommendations: Dysphagia 3 (mechanical soft);Thin liquid Liquids provided via: Cup;Straw Medication Administration: Whole meds with puree Supervision: Staff to assist with self feeding Compensations: Slow rate;Small sips/bites;Minimize environmental distractions Postural Changes and/or Swallow Maneuvers: Seated  upright 90 degrees                Oral Care Recommendations: Oral care BID Follow Up Recommendations:  (Continued SLP services at level of care recommended by PT/OT) Assistance recommended at discharge: Frequent or constant Supervision/Assistance SLP Visit Diagnosis: Dysphagia, unspecified (R13.10) Plan: Continue with current plan of care         April Saunders I. Hardin Negus, [place], [place] Office number [phone] Pager [place]  05/18/2022, 10:02 AM

## 2022-05-18 NOTE — Progress Notes (Addendum)
Subjective: Patient reports that she is lying conformably in bed in NAD. She is nonverbal. No acute events overnight.  Objective: Vital signs in last 24 hours: Temp:  [97 F (36.1 C)-98.7 F (37.1 C)] 98.7 F (37.1 C) (07/07 0400) Pulse Rate:  [48-72] 51 (07/07 0700) Resp:  [11-36] 16 (07/07 0700) BP: (86-149)/(48-109) 103/60 (07/07 0700) SpO2:  [97 %-100 %] 100 % (07/07 0700) Arterial Line BP: (92-202)/(61-88) 202/88 (07/06 1500) Weight:  [61.6 kg] 61.6 kg (07/07 0500)  Intake/Output from previous day: 07/06 0701 - 07/07 0700 In: 975.5 [I.V.:525; IV Piggyback:450.5] Out: 940 [Urine:940] Intake/Output this shift: No intake/output data recorded.  Physical Exam: Patient is not alert but will arouse to voice. She is nonverbal except for intermittent groans. She is unable to answer orientation questions. No spontaneous eye opening. Follows commands consistently in her RUE and RLE. RUE and RLE moves antigravity. LUE 2/5, LLE 3/5. PERLA. Incision is well approximated with no drainage, erythema, or edema.    Lab Results: Recent Labs    05/17/22 0439 05/18/22 0339  WBC 8.0 12.8*  HGB 12.0 12.4  HCT 37.2 37.4  PLT 216 217   BMET Recent Labs    05/17/22 0439 05/18/22 0339  NA 139 142  K 3.8 4.2  CL 104 106  CO2 24 25  GLUCOSE 154* 144*  BUN 8 13  CREATININE 0.75 0.88  CALCIUM 9.5 9.7    Studies/Results: CT HEAD WO CONTRAST  Result Date: 05/17/2022 CLINICAL DATA:  CNS neoplasm follow-up EXAM: CT HEAD WITHOUT CONTRAST TECHNIQUE: Contiguous axial images were obtained from the base of the skull through the vertex without intravenous contrast. RADIATION DOSE REDUCTION: This exam was performed according to the departmental dose-optimization program which includes automated exposure control, adjustment of the mA and/or kV according to patient size and/or use of iterative reconstruction technique. COMPARISON:  Head CT from 3 days ago FINDINGS: Brain: 2.3 cm high-density mass  centered at the right basal ganglia with expected superimposed blood products and gas after biopsy. Continued profound regional edema with midline shift measuring 7 mm, increased. There may be developing entrapment of the left lateral ventricle, but no measurable change from prior CT. No cortical infarct or extra-axial collection noted. Underestimated parenchymal masses when compared to prior postcontrast imaging. Vascular: No hyperdense vessel or unexpected calcification. Skull: Normal. Negative for fracture or focal lesion. Sinuses/Orbits: No acute finding. IMPRESSION: Expected blood products and gas around the right basal ganglia mass after biopsy. Continued pronounced vasogenic edema, midline shift has increased to 7 mm. Electronically Signed   By: Jorje Guild M.D.   On: 05/17/2022 05:34    Assessment/Plan: 66 y.o. female who POD#3 s/p stereotactic brain biopsy. She initially presented to the ED with c/o progressively worsening fatigue, generalized weakness, and balance abnormalities. Postop CT head revealed stable and expected appearance without any acute findings. MLS approximately 80m. She is continuing to be lethargic and following commands on the right only. Left-sided hemiparesis persists. Continue supportive care.      -Keppra 500 mg BID -Drcadron '6mg'$  IV Q6H X 3 days, plan to discontinue tomorrow -Transfer to 4NP      LOS: 5 days    JMarvis Moeller DNP, AGNP-C Neurosurgery Nurse Practitioner  [facility] [address] [address] [place], [place]  [id number]: [phone]   F: [phone] 05/18/2022 7:51 AM

## 2022-05-18 NOTE — Progress Notes (Signed)
PROGRESS NOTE    April Saunders  IWP:[id number] DOB: Nov 24, 1955 DOA: 05/13/2022 PCP: Willene Hatchet, NP    Chief Complaint  Patient presents with   Weakness    Brief Narrative:  April Saunders is a 66 y.o. female with medical history significant of hypertension, hyperlipidemia, degenerative joint disease, obesity presented hospital with fatigue, weakness for 3 to 4 weeks with difficulty balancing himself  and falls.  In the ED, patient had left upper extremity deficit.  Further work-up in the ED showed tumor in the right deep gray matter with extensive vasogenic edema and possible lesion in the right cerebellum. Case was discussed with  neurosurgery.  Patient was then initiated on dexamethasone and Keppra and was admitted to the hospital for further evaluation and treatment.    After hospitalization, patient has been followed by neurosurgery.  CT scan of the brain brain protocol showed enhancing lesions in the brain.  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 -MRI brain showed multifocal infiltrative and hypercellular enhancing brain masses involving both cerebral hemispheres, brainstem and cerebellum with concerns for CNS lymphoma.  Extensive brain and brainstem edema with midline shift of 7 mm noted. 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 -CT abdomen chest and pelvis with no findings of lymphadenopathy suggest suggest lymphoma. -Patient seen in  consultation by neurosurgery who recommended Decadron and Keppra. -Patient subsequently underwent stereotactic brain biopsy per neurosurgery, Dr. Marcello Moores, 05/16/2022. -Repeat CT done 05/17/2022 with expected blood products and gas around the right basal ganglia mass after biopsy.  Continued pronounced vasogenic edema, midline shift has increased to 7 mm. -We will defer oncology consultation to neurosurgery. -Supportive care. -Per neurosurgery.  2.  Hyperlipidemia -Continue statin.  3.  Mild hypokalemia -Patient noted to be on chronic potassium supplementation prior to admission. -Potassium repleted currently at 4.2, magnesium at 2.0.  4.  Hypertension -BP improving.  Antihypertensive medications of HCTZ and amlodipine on hold.   -Patient noted to have been placed on a Cardene drip overnight unsure as to why however patient noted this morning to be hypotensive requiring fluid boluses.   -Follow blood pressure, continue to hold antihypertensive medications for now.    5.  History of trigeminal neuralgia -Continue home regimen carbamazepine   DVT prophylaxis: Lovenox>>>> SCDs Code Status: Full Family Communication: Updated patient.  No family at bedside. Disposition: TBD  Status is: Inpatient Remains inpatient appropriate because: Severity of illness   Consultants:  Neurosurgery: Dr. Fenton Malling, NP neurosurgery 05/13/2022  Procedures:  CT head 05/13/2022, 05/17/2022 CT chest abdomen and pelvis 05/15/2022 MRI brain 05/13/2022 CT BrainLab head 05/14/2022 Stereotactic brain biopsy application of cranial navigation procedure note per Dr. Marcello Moores neurosurgery 05/16/2022.   Antimicrobials:  None   Subjective: Patient laying sideways in bed.  More alert.  Denies any chest pain.  No shortness of breath.  Still some left-sided neglect.    Objective: Vitals:   05/18/22 0800 05/18/22 0900 05/18/22 0923 05/18/22 1000  BP: (!) 110/58     Pulse: (!) 48 67 (!) 57 67  Resp: '13 17 16 18  '$ Temp:       TempSrc:      SpO2: 100% 100% 100% 100%  Weight:      Height:        Intake/Output Summary (Last 24 hours) at 05/18/2022 1137 Last data filed at 05/18/2022 0700 Gross per 24 hour  Intake 975.46 ml  Output 940 ml  Net 35.46 ml    Filed Weights   05/16/22 1004 05/17/22 0500 05/18/22 0500  Weight: 62.7 kg 61.1 kg 61.6 kg    Examination:  General exam: Alert.  Following simple commands.  Respiratory system: CTA B anterior lung fields.  No wheezes, no crackles, no rhonchi.  Fair air movement.  Speaking in full sentences.   Cardiovascular system: RRR no murmurs rubs or gallops.  No JVD.  No lower extremity edema.   Gastrointestinal system: Abdomen is soft, nontender, nondistended, positive bowel sounds.  No rebound.  No guarding.   Central nervous system: Alert.  Following commands..  Left upper extremity weakness.  Moving right upper extremity and lower extremities spontaneously.  Extremities: Left upper extremity weakness.  Moving right upper extremity and lower extremity spontaneously.  Skin: No rashes, lesions or ulcers Psychiatry: Judgement and insight appear fair.  Mood and affect appropriate.    Data Reviewed: I have personally reviewed following labs and imaging studies  CBC: Recent Labs  Lab 05/13/22 0017 05/15/22 0214 05/16/22 0238 05/17/22 0439 05/18/22 0339  WBC 7.1 13.0* 6.4 8.0 12.8*  NEUTROABS 4.6  --   --  7.1 11.5*  HGB 13.3 12.2 12.8 12.0 12.4  HCT 41.2 38.2 39.6 37.2 37.4  MCV 82.4 82.7 82.5 82.1 80.3  PLT 266 241 222 216 217     Basic Metabolic Panel: Recent Labs  Lab 05/13/22 0017 05/15/22 0214 05/16/22 0238 05/17/22 0439 05/18/22 0339  NA 141 142 142 139 142  K 4.0 3.3* 4.0 3.8 4.2  CL 106 105 106 104 106  CO2 '22 22 26 24 25  '$ GLUCOSE 126* 78 104* 154* 144*  BUN '14 12 9 8 13  '$ CREATININE 0.88 0.74 0.81 0.75 0.88  CALCIUM 10.6* 9.8 9.8 9.5 9.7  MG  --  2.2 2.0  --  2.0     GFR: Estimated Creatinine Clearance: 52.7 mL/min (by C-G  formula based on SCr of 0.88 mg/dL).  Liver Function Tests: Recent Labs  Lab 05/13/22 0017  AST 27  ALT 22  ALKPHOS 51  BILITOT 1.7*  PROT 7.9  ALBUMIN 4.5     CBG: Recent Labs  Lab 05/14/22 1537 05/15/22 0801 05/15/22 1242 05/15/22 1654 05/16/22 0843  GLUCAP 91 89 106* 128* 120*      No results found for this or any previous visit (from the past 240 hour(s)).       Radiology Studies: CT HEAD WO CONTRAST  Result Date: 05/17/2022 CLINICAL DATA:  CNS neoplasm follow-up EXAM: CT HEAD WITHOUT CONTRAST TECHNIQUE: Contiguous axial images were obtained from the base of the skull through the vertex without intravenous contrast. RADIATION DOSE REDUCTION: This exam was performed according to the departmental dose-optimization program which includes automated exposure control, adjustment of the mA and/or kV according to patient size and/or use of iterative reconstruction technique. COMPARISON:  Head CT from 3 days ago FINDINGS: Brain: 2.3 cm high-density mass centered at the right basal ganglia with expected superimposed blood products and gas after biopsy. Continued profound regional edema with midline shift measuring 7 mm, increased. There may be developing entrapment of  the left lateral ventricle, but no measurable change from prior CT. No cortical infarct or extra-axial collection noted. Underestimated parenchymal masses when compared to prior postcontrast imaging. Vascular: No hyperdense vessel or unexpected calcification. Skull: Normal. Negative for fracture or focal lesion. Sinuses/Orbits: No acute finding. IMPRESSION: Expected blood products and gas around the right basal ganglia mass after biopsy. Continued pronounced vasogenic edema, midline shift has increased to 7 mm. Electronically Signed   By: Jorje Guild M.D.   On: 05/17/2022 05:34        Scheduled Meds:  carbamazepine  200 mg Oral BID   Chlorhexidine Gluconate Cloth  6 each Topical Daily   dexamethasone  (DECADRON) injection  6 mg Intravenous Q6H   pantoprazole  40 mg Oral QHS   simvastatin  20 mg Oral Daily   Continuous Infusions:  sodium chloride 125 mL/hr at 05/18/22 0954   levETIRAcetam Stopped (05/17/22 2344)     LOS: 5 days    Time spent: 40 minutes    Irine Seal, MD [facility]   To contact the attending provider between 7A-7P or the covering provider during after hours 7P-7A, please log into the web site [email or web address] and access using universal [place] password for that web site. If you do not have the password, please call the hospital operator.  05/18/2022, 11:37 AM

## 2022-05-19 DIAGNOSIS — D496 Neoplasm of unspecified behavior of brain: Secondary | ICD-10-CM | POA: Diagnosis not present

## 2022-05-19 DIAGNOSIS — I10 Essential (primary) hypertension: Secondary | ICD-10-CM | POA: Diagnosis not present

## 2022-05-19 DIAGNOSIS — E785 Hyperlipidemia, unspecified: Secondary | ICD-10-CM | POA: Diagnosis not present

## 2022-05-19 DIAGNOSIS — E876 Hypokalemia: Secondary | ICD-10-CM | POA: Diagnosis not present

## 2022-05-19 LAB — CBC WITH DIFFERENTIAL/PLATELET
Abs Immature Granulocytes: 0.07 10*3/uL (ref 0.00–0.07)
Basophils Absolute: 0 10*3/uL (ref 0.0–0.1)
Basophils Relative: 0 %
Eosinophils Absolute: 0 10*3/uL (ref 0.0–0.5)
Eosinophils Relative: 0 %
HCT: 37.4 % (ref 36.0–46.0)
Hemoglobin: 12.2 g/dL (ref 12.0–15.0)
Immature Granulocytes: 1 %
Lymphocytes Relative: 6 %
Lymphs Abs: 0.7 10*3/uL (ref 0.7–4.0)
MCH: 26.2 pg (ref 26.0–34.0)
MCHC: 32.6 g/dL (ref 30.0–36.0)
MCV: 80.4 fL (ref 80.0–100.0)
Monocytes Absolute: 0.6 10*3/uL (ref 0.1–1.0)
Monocytes Relative: 5 %
Neutro Abs: 11.1 10*3/uL — ABNORMAL HIGH (ref 1.7–7.7)
Neutrophils Relative %: 88 %
Platelets: 223 10*3/uL (ref 150–400)
RBC: 4.65 MIL/uL (ref 3.87–5.11)
RDW: 14.3 % (ref 11.5–15.5)
WBC: 12.4 10*3/uL — ABNORMAL HIGH (ref 4.0–10.5)
nRBC: 0 % (ref 0.0–0.2)

## 2022-05-19 LAB — BASIC METABOLIC PANEL
Anion gap: 13 (ref 5–15)
BUN: 13 mg/dL (ref 8–23)
CO2: 25 mmol/L (ref 22–32)
Calcium: 9.5 mg/dL (ref 8.9–10.3)
Chloride: 105 mmol/L (ref 98–111)
Creatinine, Ser: 0.76 mg/dL (ref 0.44–1.00)
GFR, Estimated: >60 mL/min (ref >60)
Glucose, Bld: 119 mg/dL — ABNORMAL HIGH (ref 70–99)
Potassium: 3.6 mmol/L (ref 3.5–5.1)
Sodium: 143 mmol/L (ref 135–145)

## 2022-05-19 LAB — GLUCOSE, CAPILLARY: Glucose-Capillary: 114 mg/dL — ABNORMAL HIGH (ref 70–99)

## 2022-05-19 MED ORDER — AMLODIPINE BESYLATE 5 MG PO TABS
5.0000 mg | ORAL_TABLET | Freq: Every day | ORAL | Status: DC
  Administered 2022-05-19 – 2022-05-20 (×2): 5 mg via ORAL
  Filled 2022-05-19 (×2): qty 1

## 2022-05-19 NOTE — Evaluation (Signed)
Physical Therapy Evaluation Patient Details Name: April Saunders MRN: [id number] DOB: 1956/06/08 Today's Date: 05/19/2022  History of Present Illness  The pt is a 66 yo female presenting 7/2 with increased weakness and falls in last 3 weeks. CT revealed a 2.5 cm hypercellular mass with extensive vasogenic edema centered at the right deep gray nuclei with possible 2nd lesion in the right cerebellum. Pt now s/p stereotactic brain biopsy on 7/5. PMH includes: DJD, HLD, HTN, and trigenimal neuralgia.   Clinical Impression  Pt in bed upon arrival of PT, agreeable to evaluation at this time. 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 The pt now presents with limitations in functional mobility, strength, ROM, coordination, seated and standing stability, and activity tolerance due to above dx, and will continue to benefit from skilled PT to address these deficits. The pt required modA and significant cues to maintain alertness and attention to task to complete rolling and bed mobility, and then required mod-maxA of 1-2 to complete sit-stand transfers and pivotal movements to recliner and BSC. The pt was unable to generate any steps with LLE at this time, and needed maxA to manage safe movements, block the L knee, and manage placement of L foot during transfers. The pt will continue to benefit from skilled PT acutely and acute inpatient rehab when medically stable for d/c. Anticipate her tolerance will improve during acute stay and the pt has great family support available.         Recommendations for follow up therapy are one component of a multi-disciplinary discharge planning process, led by the attending physician.  Recommendations may be updated based on patient status, additional functional criteria and insurance authorization.  Follow Up Recommendations Acute inpatient rehab (3hours/day)      Assistance Recommended at  Discharge    Patient can return home with the following  A lot of help with walking and/or transfers;A lot of help with bathing/dressing/bathroom;Assistance with cooking/housework;Assistance with feeding;Direct supervision/assist for medications management;Direct supervision/assist for financial management;Assist for transportation;Help with stairs or ramp for entrance    Equipment Recommendations Wheelchair (measurements PT);Wheelchair cushion (measurements PT)  Recommendations for Other Services  Rehab consult    Functional Status Assessment Patient has had a recent decline in their functional status and demonstrates the ability to make significant improvements in function in a reasonable and predictable amount of time.     Precautions / Restrictions Precautions Precautions: Fall Restrictions Weight Bearing Restrictions: No      Mobility  Bed Mobility Overal bed mobility: Needs Assistance Bed Mobility: Supine to Sit     Supine to sit: Mod assist          Transfers Overall transfer level: Needs assistance Equipment used: 1 person hand held assist, 2 person hand held assist Transfers: Sit to/from Stand, Bed to chair/wheelchair/BSC Sit to Stand: Mod assist, Max assist, +2 physical assistance Stand pivot transfers: Max assist Step pivot transfers: Max assist, +2 physical assistance       General transfer comment: attempted maxA of 2 to stand with BUE support, blocking of L knee to maintain. Pt then tolerated x2 pivot transfers to [facility] and then back to recliner with maxA of 1    Ambulation/Gait               General Gait Details: pt unable to initiate steps at this time     Balance Overall balance assessment: Needs assistance Sitting-balance  support: Single extremity supported, Feet supported Sitting balance-Leahy Scale: Poor     Standing balance support: Bilateral upper extremity supported Standing balance-Leahy Scale: Zero Standing balance comment:  dependent on UE support and mod-maxA                             Pertinent Vitals/Pain Pain Assessment Pain Assessment: No/denies pain    Home Living Family/patient expects to be discharged to:: Private residence Living Arrangements: Children;Other relatives Available Help at Discharge: Family;Available 24 hours/day Type of Home: House Home Access: Stairs to enter Entrance Stairs-Rails: Can reach both Entrance Stairs-Number of Steps: 1 + 2   Home Layout: One level Home Equipment: Shower seat      Prior Function Prior Level of Function : Independent/Modified Independent;Working/employed;Driving;History of Falls (last six months) (one day with x2 falls)             Mobility Comments: no use of DME, no exercise, no other falls 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, independent with driving, medication management     Hand Dominance   Dominant Hand: Right    Extremity/Trunk Assessment   Upper Extremity Assessment Upper Extremity Assessment: Defer to OT evaluation;LUE deficits/detail LUE Deficits / Details: patient holding her L arm in a flexed position, difficult to resistance versus active tone.  Not moving her L arm away from her body. LUE Sensation: decreased light touch LUE Coordination: decreased fine motor;decreased gross motor    Lower Extremity Assessment Lower Extremity Assessment: Generalized weakness;LLE deficits/detail LLE Deficits / Details: limited by poor command following, assist for ROM against gravity, no active movement at ankle LLE Coordination: decreased fine motor;decreased gross motor    Cervical / Trunk Assessment Cervical / Trunk Assessment: Normal  Communication   Communication: No difficulties  Cognition Arousal/Alertness: Awake/alert Behavior During Therapy: Flat affect Overall Cognitive Status: Impaired/Different from baseline Area of Impairment: Orientation, Attention, Memory, Following commands, Safety/judgement, Awareness,  Problem solving                 Orientation Level: Person, Place Current Attention Level: Focused Memory: Decreased short-term memory Following Commands: Follows one step commands inconsistently Safety/Judgement: Decreased awareness of safety, Decreased awareness of deficits Awareness: Intellectual Problem Solving: Slow processing, Decreased initiation, Difficulty sequencing, Requires verbal cues, Requires tactile cues General Comments: pt with intermittent reponses, asking questions to family members who were no longer in the room despite being told, maintains eyes closed and will not look to the L        General Comments General comments (skin integrity, edema, etc.): VSS on RA        Assessment/Plan    PT Assessment Patient needs continued PT services  PT Problem List Decreased strength;Decreased range of motion;Decreased activity tolerance;Decreased balance;Decreased mobility;Decreased coordination;Decreased cognition;Decreased safety awareness;Decreased knowledge of precautions       PT Treatment Interventions DME instruction;Gait training;Stair training;Functional mobility training;Therapeutic activities;Therapeutic exercise;Balance training;Patient/family education    PT Goals (Current goals can be found in the Care Plan section)  Acute Rehab PT Goals Patient Stated Goal: none stated PT Goal Formulation: Patient unable to participate in goal setting Time For Goal Achievement: 06/02/22 Potential to Achieve Goals: Good    Frequency Min 4X/week     Co-evaluation PT/OT/SLP Co-Evaluation/Treatment: Yes Reason for Co-Treatment: Complexity of the patient's impairments (multi-system involvement);Necessary to address cognition/behavior during functional activity;For patient/therapist safety;To address functional/ADL transfers PT goals addressed during session: Mobility/safety with mobility;Balance  AM-PAC PT "6 Clicks" Mobility  Outcome Measure Help needed  turning from your back to your side while in a flat bed without using bedrails?: A Lot Help needed moving from lying on your back to sitting on the side of a flat bed without using bedrails?: A Lot Help needed moving to and from a bed to a chair (including a wheelchair)?: Total Help needed standing up from a chair using your arms (e.g., wheelchair or bedside chair)?: A Lot Help needed to walk in hospital room?: Total Help needed climbing 3-5 steps with a railing? : Total 6 Click Score: 9    End of Session Equipment Utilized During Treatment: Gait belt Activity Tolerance: Patient tolerated treatment well;Patient limited by fatigue;Patient limited by lethargy Patient left: in chair;with call bell/phone within reach;with chair alarm set;with family/visitor present Nurse Communication: Mobility status PT Visit Diagnosis: Other abnormalities of gait and mobility (R26.89);Muscle weakness (generalized) (M62.81);Difficulty in walking, not elsewhere classified (R26.2)    Time: 6773-7366 PT Time Calculation (min) (ACUTE ONLY): 39 min   Charges:   PT Evaluation $PT Eval Moderate Complexity: 1 Mod PT Treatments $Therapeutic Activity: 8-22 mins        [place], PT, DPT   Acute Rehabilitation Department  Sandra Cockayne 05/19/2022, 2:12 PM

## 2022-05-19 NOTE — Progress Notes (Signed)
Inpatient Rehab Admissions Coordinator:  ? ?Per therapy recommendations,  patient was screened for CIR candidacy by Mahsa Hanser, MS, CCC-SLP. At this time, Pt. Appears to be a a potential candidate for CIR. I will place   order for rehab consult per protocol for full assessment. Please contact me any with questions. ? ?Orpah Hausner, MS, CCC-SLP ?Rehab Admissions Coordinator  ?[phone] (celll) ?[phone] (office) ? ?

## 2022-05-19 NOTE — Progress Notes (Addendum)
PROGRESS NOTE    April Saunders  FWY:[id number] DOB: December 03, 1955 DOA: 05/13/2022 PCP: Willene Hatchet, NP    Chief Complaint  Patient presents with   Weakness    Brief Narrative:  April Saunders is a 66 y.o. female with medical history significant of hypertension, hyperlipidemia, degenerative joint disease, obesity presented hospital with fatigue, weakness for 3 to 4 weeks with difficulty balancing himself  and falls.  In the ED, patient had left upper extremity deficit.  Further work-up in the ED showed tumor in the right deep gray matter with extensive vasogenic edema and possible lesion in the right cerebellum. Case was discussed with  neurosurgery.  Patient was then initiated on dexamethasone and Keppra and was admitted to the hospital for further evaluation and treatment.    After hospitalization, patient has been followed by neurosurgery.  CT scan of the brain brain protocol showed enhancing lesions in the brain.  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 -MRI brain showed multifocal infiltrative and hypercellular enhancing brain masses involving both cerebral hemispheres, brainstem and cerebellum with concerns for CNS lymphoma.  Extensive brain and brainstem edema with midline shift of 7 mm noted. 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 -CT abdomen chest and pelvis with no findings of lymphadenopathy suggest suggest lymphoma. -Patient seen in  consultation by neurosurgery who recommended Decadron and Keppra. -Patient subsequently underwent stereotactic brain biopsy per neurosurgery, Dr. Marcello Moores, 05/16/2022. -Repeat CT done 05/17/2022 with expected blood products and gas around the right basal ganglia mass after biopsy.  Continued pronounced vasogenic edema, midline shift has increased to 7 mm. -Case discussed with neuro-oncology, Dr. Mickeal Skinner who is aware of patient and patient will be presented on tumor board on Monday, 05/21/2022 and outpatient follow-up will be scheduled for patient. -Per neurosurgery.  2.  Hyperlipidemia -Continue statin.  3.  Mild hypokalemia -Patient noted to be on chronic potassium supplementation prior to admission. -Potassium repleted currently at 3.6, magnesium at 2.0.  4.  Hypertension -BP improving.  Antihypertensive medications of HCTZ and amlodipine on hold.   -Patient noted to have been placed on a Cardene drip unsure as to why however patient noted the morning of 05/18/2022, to be hypotensive requiring fluid boluses.   -Hypotension improved, resume home regimen Norvasc.  5.  History of trigeminal neuralgia -Continue carbamazepine.    DVT prophylaxis: Lovenox>>>> SCDs Code Status: Full Family Communication: Updated patient,, updated daughter and husband at bedside.  Disposition: ??  CIR  Status is: Inpatient Remains inpatient appropriate because: Severity of illness   Consultants:  Neurosurgery: Dr. Fenton Malling, NP neurosurgery 05/13/2022  Procedures:  CT head 05/13/2022, 05/17/2022 CT chest abdomen and pelvis 05/15/2022 MRI brain 05/13/2022 CT BrainLab head 05/14/2022 Stereotactic brain biopsy application of cranial navigation procedure note per Dr. Marcello Moores neurosurgery 05/16/2022.   Antimicrobials:  None   Subjective: Patient laying in bed.  Keeps eyes closed.  Answering some questions.  Follows some simple commands.  Still with some left-sided neglect.  Denies any chest pain.  No shortness of  breath.  No abdominal pain.  Unable to tell me whether she ate breakfast today.      Objective: Vitals:   05/19/22 1145 05/19/22 1200 05/19/22 1215 05/19/22 1243  BP:  (!) 148/55 (!) 148/55 (!) 139/117  Pulse: 62 70 64   Resp: 18 19 (!) 27   Temp:  100.3 F (37.9 C)  (!) 97.3 F (36.3 C)  TempSrc:  Axillary  Axillary  SpO2: 100% 99% 100%   Weight:      Height:        Intake/Output Summary (Last 24 hours) at 05/19/2022 1250 Last data filed at 05/19/2022 1200 Gross per 24 hour  Intake 3320 ml  Output 2800 ml  Net 520 ml    Filed Weights   05/16/22 1004 05/17/22 0500 05/18/22 0500  Weight: 62.7 kg 61.1 kg 61.6 kg    Examination:  General exam: Alert.  Following simple commands.  Respiratory system: CTA B anterior lung fields.  No wheezes, no crackles, no rhonchi.  Fair air movement.  Speaking in full sentences.   Cardiovascular system: RRR no murmurs rubs or gallops.  No JVD.  No lower extremity edema.   Gastrointestinal system: Abdomen is soft, nontender, nondistended, positive bowel sounds.  No rebound.  No guarding.   Central nervous system: Alert.  Following commands..  Left upper extremity weakness.  Moving right upper extremity and lower extremities spontaneously.  Extremities: Left upper extremity weakness/left upper extremity with some rigidity..  Moving right upper extremity and lower extremity spontaneously.  Skin: No rashes, lesions or ulcers Psychiatry: Judgement and insight appear fair.  Mood and affect appropriate.    Data Reviewed: I have personally reviewed following labs and imaging studies  CBC: Recent Labs  Lab 05/13/22 0017 05/15/22 0214 05/16/22 0238 05/17/22 0439 05/18/22 0339 05/19/22 0216  WBC 7.1 13.0* 6.4 8.0 12.8* 12.4*  NEUTROABS 4.6  --   --  7.1 11.5* 11.1*  HGB 13.3 12.2 12.8 12.0 12.4 12.2  HCT 41.2 38.2 39.6 37.2 37.4 37.4  MCV 82.4 82.7 82.5 82.1 80.3 80.4  PLT 266 241 222 216 217 223     Basic Metabolic Panel: Recent Labs   Lab 05/15/22 0214 05/16/22 0238 05/17/22 0439 05/18/22 0339 05/19/22 0216  NA 142 142 139 142 143  K 3.3* 4.0 3.8 4.2 3.6  CL 105 106 104 106 105  CO2 '22 26 24 25 25  '$ GLUCOSE 78 104* 154* 144* 119*  BUN '12 9 8 13 13  '$ CREATININE 0.74 0.81 0.75 0.88 0.76  CALCIUM 9.8 9.8 9.5 9.7 9.5  MG 2.2 2.0  --  2.0  --      GFR: Estimated Creatinine Clearance: 58 mL/min (by C-G formula based on SCr of 0.76 mg/dL).  Liver Function Tests: Recent Labs  Lab 05/13/22 0017  AST 27  ALT 22  ALKPHOS 51  BILITOT 1.7*  PROT 7.9  ALBUMIN 4.5     CBG: Recent Labs  Lab 05/15/22 1242 05/15/22 1654 05/16/22 0843 05/18/22 0826 05/19/22 0717  GLUCAP 106* 128* 120* 153* 114*      No results found for this or any previous visit (from the past 240 hour(s)).       Radiology Studies: No results found.      Scheduled Meds:  carbamazepine  200 mg Oral BID   Chlorhexidine Gluconate Cloth  6 each Topical Daily   dexamethasone (DECADRON) injection  6 mg Intravenous Q6H   pantoprazole  40 mg Oral QHS   simvastatin  20 mg  Oral Daily   Continuous Infusions:  sodium chloride 125 mL/hr at 05/19/22 1200   levETIRAcetam Stopped (05/19/22 1146)     LOS: 6 days    Time spent: 40 minutes    Irine Seal, MD [facility]   To contact the attending provider between 7A-7P or the covering provider during after hours 7P-7A, please log into the web site [email or web address] and access using universal  password for that web site. If you do not have the password, please call the hospital operator.  05/19/2022, 12:50 PM

## 2022-05-19 NOTE — Evaluation (Signed)
Occupational Therapy Evaluation Patient Details Name: April Saunders MRN: [id number] DOB: Sep 04, 1956 Today's Date: 05/19/2022   History of Present Illness The pt is a 66 yo female presenting 7/2 with increased weakness and falls in last 3 weeks. CT revealed a 2.5 cm hypercellular mass with extensive vasogenic edema centered at the right deep gray nuclei with possible 2nd lesion in the right cerebellum. Pt now s/p stereotactic brain biopsy on 7/5. PMH includes: DJD, HLD, HTN, and trigenimal neuralgia.   Clinical Impression   Patient admitted for the diagnosis above.  PTA she lives with her spouse, continues to work, and needed no assist for ADL or iADL.  Deficits are listed below.  Currently the patient is needing up to +2 Max A for basic mobility and near dependence for lower body ADL in sit/stand level.  OT will follow in the acute setting with AIR being recommended for post acute rehab prior to returning home depending on progress.        Recommendations for follow up therapy are one component of a multi-disciplinary discharge planning process, led by the attending physician.  Recommendations may be updated based on patient status, additional functional criteria and insurance authorization.   Follow Up Recommendations  Acute inpatient rehab (3hours/day)    Assistance Recommended at Discharge Frequent or constant Supervision/Assistance  Patient can return home with the following Two people to help with walking and/or transfers;A lot of help with bathing/dressing/bathroom;Direct supervision/assist for medications management;Direct supervision/assist for financial management;Assist for transportation;Assistance with cooking/housework;Help with stairs or ramp for entrance;Assistance with feeding    Functional Status Assessment  Patient has had a recent decline in their functional status and demonstrates the ability to make significant improvements in function in a reasonable and predictable amount  of time.  Equipment Recommendations  Wheelchair (measurements OT);Wheelchair cushion (measurements OT);Tub/shower bench;Hospital bed    Recommendations for Other Services Rehab consult     Precautions / Restrictions Precautions Precautions: Fall Restrictions Weight Bearing Restrictions: No      Mobility Bed Mobility Overal bed mobility: Needs Assistance Bed Mobility: Supine to Sit     Supine to sit: Mod assist          Transfers Overall transfer level: Needs assistance   Transfers: Sit to/from Stand, Bed to chair/wheelchair/BSC Sit to Stand: Max assist, Mod assist Stand pivot transfers: Max assist   Step pivot transfers: Max assist, +2 physical assistance            Balance Overall balance assessment: Needs assistance Sitting-balance support: Single extremity supported, Feet supported Sitting balance-Leahy Scale: Poor     Standing balance support: Bilateral upper extremity supported Standing balance-Leahy Scale: Zero                             ADL either performed or assessed with clinical judgement   ADL Overall ADL's : Needs assistance/impaired Eating/Feeding: Maximal assistance;Sitting   Grooming: Maximal assistance;Sitting   Upper Body Bathing: Maximal assistance;Sitting   Lower Body Bathing: Sit to/from stand;+2 for physical assistance;Cueing for sequencing;Cueing for compensatory techniques;Total assistance   Upper Body Dressing : Maximal assistance;Sitting   Lower Body Dressing: Total assistance;Sit to/from stand;+2 for physical assistance   Toilet Transfer: Maximal assistance;Stand-pivot;BSC/3in1   Toileting- Clothing Manipulation and Hygiene: Total assistance;Sit to/from stand;+2 for physical assistance               Vision Baseline Vision/History: 1 Wears glasses Patient Visual Report: Peripheral vision impairment Vision Assessment?: Yes;Vision impaired-  to be further tested in functional context Tracking/Visual  Pursuits: Impaired - to be further tested in functional context Visual Fields: Impaired-to be further tested in functional context Additional Comments: patient can turn her head L sightly, but not looking to the left at all.  At one point patient stating she could not see out of her R eye, but blinks to confrontation to both side.     Perception Perception Perception: Impaired Inattention/Neglect: Does not attend to left visual field;Does not attend to left side of body   Praxis Praxis Praxis: Impaired Praxis Impairment Details: Ideation;Initiation;Motor planning    Pertinent Vitals/Pain Pain Assessment Pain Assessment: No/denies pain     Hand Dominance Right   Extremity/Trunk Assessment Upper Extremity Assessment Upper Extremity Assessment: LUE deficits/detail LUE Deficits / Details: patient holding her L arm in a flexed position, difficult to resistance versus active tone.  Not moving her L arm away from her body. LUE Sensation: decreased light touch LUE Coordination: decreased fine motor;decreased gross motor   Lower Extremity Assessment Lower Extremity Assessment: Defer to PT evaluation   Cervical / Trunk Assessment Cervical / Trunk Assessment: Normal   Communication Communication Communication: No difficulties   Cognition Arousal/Alertness: Awake/alert Behavior During Therapy: Flat affect Overall Cognitive Status: Impaired/Different from baseline Area of Impairment: Orientation, Attention, Memory, Following commands, Safety/judgement, Awareness, Problem solving                 Orientation Level: Person, Place Current Attention Level: Focused Memory: Decreased short-term memory Following Commands: Follows one step commands inconsistently Safety/Judgement: Decreased awareness of safety, Decreased awareness of deficits Awareness: Intellectual Problem Solving: Slow processing, Decreased initiation, Difficulty sequencing, Requires verbal cues, Requires tactile  cues       General Comments   VSS on RA    Exercises     Shoulder Instructions      Home Living Family/patient expects to be discharged to:: Private residence Living Arrangements: Children;Other relatives Available Help at Discharge: Family;Available 24 hours/day Type of Home: House Home Access: Stairs to enter [employer] of Steps: 1 + 2 Entrance Stairs-Rails: Can reach both Home Layout: One level     Bathroom Shower/Tub: Teacher, early years/pre: Standard     Home Equipment: Shower seat          Prior Functioning/Environment Prior Level of Function : Independent/Modified Independent;Working/employed;Driving;History of Falls (last six months)             Mobility Comments: no use of DME, no exercise, no other falls 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, independent with driving, medication management        OT Problem List: Decreased strength;Decreased range of motion;Decreased activity tolerance;Impaired balance (sitting and/or standing);Impaired vision/perception;Decreased knowledge of precautions;Decreased knowledge of use of DME or AE;Decreased safety awareness;Decreased cognition;Decreased coordination;Impaired sensation;Impaired UE functional use      OT Treatment/Interventions: Self-care/ADL training;Therapeutic activities;Cognitive remediation/compensation;Neuromuscular education;Visual/perceptual remediation/compensation;Patient/family education;DME and/or AE instruction;Balance training    OT Goals(Current goals can be found in the care plan section) Acute Rehab OT Goals Patient Stated Goal: Family hoping for rehab OT Goal Formulation: With patient Time For Goal Achievement: 06/02/22 Potential to Achieve Goals: Fair ADL Goals Pt Will Perform Grooming: with min assist;sitting Pt Will Perform Upper Body Dressing: with min assist;sitting Pt Will Perform Lower Body Dressing: with mod assist;sit to/from stand Pt Will Transfer to Toilet:  with min assist;stand pivot transfer;bedside commode  OT Frequency: Min 2X/week    Co-evaluation  AM-PAC OT "6 Clicks" Daily Activity     Outcome Measure Help from another person eating meals?: A Lot Help from another person taking care of personal grooming?: A Lot Help from another person toileting, which includes using toliet, bedpan, or urinal?: A Lot Help from another person bathing (including washing, rinsing, drying)?: A Lot Help from another person to put on and taking off regular upper body clothing?: A Lot Help from another person to put on and taking off regular lower body clothing?: A Lot 6 Click Score: 12   End of Session Equipment Utilized During Treatment: Gait belt Nurse Communication: Mobility status  Activity Tolerance: Patient tolerated treatment well Patient left: in chair;with call bell/phone within reach;with chair alarm set  OT Visit Diagnosis: Unsteadiness on feet (R26.81);Muscle weakness (generalized) (M62.81);Ataxia, unspecified (R27.0);Other symptoms and signs involving cognitive function;Hemiplegia and hemiparesis;Low vision, both eyes (H54.2) Hemiplegia - Right/Left: Left Hemiplegia - dominant/non-dominant: Non-Dominant Hemiplegia - caused by: Other cerebrovascular disease                Time: 1012-1053 OT Time Calculation (min): 41 min Charges:  OT General Charges $OT Visit: 1 Visit OT Evaluation $OT Eval Moderate Complexity: 1 Mod  05/19/2022  RP, OTR/L  Acute Rehabilitation Services  Office:  [phone]   Metta Clines 05/19/2022, 11:10 AM

## 2022-05-19 NOTE — Progress Notes (Signed)
Patient remains somnolent but will awaken and answer some simple questions.  Denies headache.  Status post stereotactic biopsy for multifocal CNS lymphoma.  Continue high-dose steroids.  Management per oncology with regard to optimal treatment for lymphoma.

## 2022-05-20 DIAGNOSIS — E785 Hyperlipidemia, unspecified: Secondary | ICD-10-CM | POA: Diagnosis not present

## 2022-05-20 DIAGNOSIS — D496 Neoplasm of unspecified behavior of brain: Secondary | ICD-10-CM | POA: Diagnosis not present

## 2022-05-20 DIAGNOSIS — E876 Hypokalemia: Secondary | ICD-10-CM | POA: Diagnosis not present

## 2022-05-20 DIAGNOSIS — I10 Essential (primary) hypertension: Secondary | ICD-10-CM | POA: Diagnosis not present

## 2022-05-20 LAB — CBC WITH DIFFERENTIAL/PLATELET
Abs Immature Granulocytes: 0.05 10*3/uL (ref 0.00–0.07)
Basophils Absolute: 0 10*3/uL (ref 0.0–0.1)
Basophils Relative: 0 %
Eosinophils Absolute: 0 10*3/uL (ref 0.0–0.5)
Eosinophils Relative: 0 %
HCT: 40.8 % (ref 36.0–46.0)
Hemoglobin: 13.4 g/dL (ref 12.0–15.0)
Immature Granulocytes: 0 %
Lymphocytes Relative: 9 %
Lymphs Abs: 1 10*3/uL (ref 0.7–4.0)
MCH: 26.5 pg (ref 26.0–34.0)
MCHC: 32.8 g/dL (ref 30.0–36.0)
MCV: 80.6 fL (ref 80.0–100.0)
Monocytes Absolute: 0.7 10*3/uL (ref 0.1–1.0)
Monocytes Relative: 6 %
Neutro Abs: 9.5 10*3/uL — ABNORMAL HIGH (ref 1.7–7.7)
Neutrophils Relative %: 85 %
Platelets: 247 10*3/uL (ref 150–400)
RBC: 5.06 MIL/uL (ref 3.87–5.11)
RDW: 14.2 % (ref 11.5–15.5)
WBC: 11.1 10*3/uL — ABNORMAL HIGH (ref 4.0–10.5)
nRBC: 0 % (ref 0.0–0.2)

## 2022-05-20 LAB — BASIC METABOLIC PANEL
Anion gap: 11 (ref 5–15)
BUN: 12 mg/dL (ref 8–23)
CO2: 25 mmol/L (ref 22–32)
Calcium: 9.6 mg/dL (ref 8.9–10.3)
Chloride: 102 mmol/L (ref 98–111)
Creatinine, Ser: 0.84 mg/dL (ref 0.44–1.00)
GFR, Estimated: >60 mL/min (ref >60)
Glucose, Bld: 124 mg/dL — ABNORMAL HIGH (ref 70–99)
Potassium: 3.6 mmol/L (ref 3.5–5.1)
Sodium: 138 mmol/L (ref 135–145)

## 2022-05-20 LAB — GLUCOSE, CAPILLARY: Glucose-Capillary: 114 mg/dL — ABNORMAL HIGH (ref 70–99)

## 2022-05-20 MED ORDER — AMLODIPINE BESYLATE 2.5 MG PO TABS
2.5000 mg | ORAL_TABLET | Freq: Every day | ORAL | Status: DC
  Administered 2022-05-21 – 2022-06-01 (×10): 2.5 mg via ORAL
  Filled 2022-05-20 (×11): qty 1

## 2022-05-20 NOTE — Progress Notes (Signed)
   Providing Compassionate, Quality Care - Together   Subjective: Patient reports no new issues. She appears more alert this morning.  Objective: Vital signs in last 24 hours: Temp:  [97.3 F (36.3 C)-100.3 F (37.9 C)] 98 F (36.7 C) (07/09 0716) Pulse Rate:  [47-70] 47 (07/09 0716) Resp:  [14-27] 15 (07/09 0716) BP: (113-174)/(55-117) 113/64 (07/09 0716) SpO2:  [99 %-100 %] 99 % (07/09 0716) Weight:  [62.3 kg] 62.3 kg (07/09 0500)  Intake/Output from previous day: 07/08 0701 - 07/09 0700 In: 850 [I.V.:750; IV Piggyback:100] Out: 1550 [Urine:1550] Intake/Output this shift: No intake/output data recorded.  Alert and oriented x 4 PERRLA Speech clear Following commands MAE, strength is improving on the left Incision is clean, dry, and intact   Lab Results: Recent Labs    05/19/22 0216 05/20/22 0332  WBC 12.4* 11.1*  HGB 12.2 13.4  HCT 37.4 40.8  PLT 223 247   BMET Recent Labs    05/19/22 0216 05/20/22 0332  NA 143 138  K 3.6 3.6  CL 105 102  CO2 25 25  GLUCOSE 119* 124*  BUN 13 12  CREATININE 0.76 0.84  CALCIUM 9.5 9.6    Studies/Results: No results found.  Assessment/Plan: Status post stereotactic biopsy for multifocal CNS lymphoma by Dr. Marcello Moores on 05/16/2022.   LOS: 7 days   -Continue high-dose steroids -Possibly CIR at discharge -Continue supportive efforts   Viona Gilmore, [place], AGNP-C Nurse Practitioner  [facility] [place]. [address], [place], [place] [address] P: [phone]    F: [phone]  05/20/2022, 11:58 AM

## 2022-05-20 NOTE — Progress Notes (Signed)
PROGRESS NOTE    April Saunders  HUT:[id number] DOB: [birth year] DOA: 05/13/2022 PCP: Willene Hatchet, NP    Chief Complaint  Patient presents with   Weakness    Brief Narrative:  April Saunders is a 66 y.o. female with medical history significant of hypertension, hyperlipidemia, degenerative joint disease, obesity presented hospital with fatigue, weakness for 3 to 4 weeks with difficulty balancing himself  and falls.  In the ED, patient had left upper extremity deficit.  Further work-up in the ED showed tumor in the right deep gray matter with extensive vasogenic edema and possible lesion in the right cerebellum. Case was discussed with  neurosurgery.  Patient was then initiated on dexamethasone and Keppra and was admitted to the hospital for further evaluation and treatment.    After hospitalization, patient has been followed by neurosurgery.  CT scan of the brain brain protocol showed enhancing lesions in the brain.  CT scan of the chest abdomen and pelvis done 05/15/2022 without any evidence of lymphadenopathy   Assessment & Plan:   Principal Problem:   Brain tumor ([place]) Active Problems:   Hypertension   Dyslipidemia   Hypokalemia   #1 multifocal brain tumors with associated vasogenic edema left-sided weakness -Patient had presented with generalized weakness, fatigue, difficulty balancing herself as well as falls with left upper extremity deficit. -MRI brain showed multifocal infiltrative and hypercellular enhancing brain masses involving both cerebral hemispheres, brainstem and cerebellum with concerns for CNS lymphoma.  Extensive brain and brainstem edema with midline shift of 7 mm noted. 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 -CT abdomen chest and pelvis with no findings of lymphadenopathy suggest suggest lymphoma. -Patient seen in  consultation by neurosurgery who recommended Decadron and Keppra. -Patient subsequently underwent stereotactic brain biopsy per neurosurgery, Dr. Marcello Moores, 05/16/2022. -Repeat CT done 05/17/2022 with expected blood products and gas around the right basal ganglia mass after biopsy.  Continued pronounced vasogenic edema, midline shift has increased to 7 mm. -Case discussed with neuro-oncology, Dr. Mickeal Skinner who is aware of patient and patient will be presented on tumor board on Monday, 05/21/2022 and outpatient follow-up will be scheduled for patient. -Continue current dose of high-dose IV Decadron and if continued improved oral intake can transition to oral Decadron tomorrow. -Per neurosurgery.  2.  Hyperlipidemia -Statin.    3.  Mild hypokalemia -Patient noted to be on chronic potassium supplementation prior to admission. -Potassium repleted currently at 3.6, magnesium at 2.0.  4.  Hypertension -BP improving.  Antihypertensive medications of HCTZ and amlodipine on hold.   -Patient noted to have been placed on a Cardene drip unsure as to why however patient noted the morning of 05/18/2022, to be hypotensive requiring fluid boluses.   -Hypotension improved, and patient started on Norvasc 5 mg daily.   -Decrease Norvasc to 2.5 mg daily.   -Follow-up.   5.  History of trigeminal neuralgia -Continue carbamazepine.    DVT prophylaxis: Lovenox>>>> SCDs Code Status: Full Family Communication: Updated patient,, updated daughter at bedside. Disposition: ??  CIR  Status is: Inpatient Remains inpatient appropriate because: Severity of illness   Consultants:  Neurosurgery: Dr. Fenton Malling, NP neurosurgery 05/13/2022 Curb sided neuro-oncology: Dr. Mickeal Skinner 05/19/2022  Procedures:  CT head 05/13/2022, 05/17/2022 CT chest abdomen and pelvis 05/15/2022 MRI brain 05/13/2022 CT BrainLab head 05/14/2022 Stereotactic brain biopsy application of cranial navigation procedure note per Dr. Marcello Moores neurosurgery  05/16/2022.   Antimicrobials:  None   Subjective: Alert.  Eyes open.  Denies any chest pain.  No shortness of breath.  No abdominal pain.  Daughter at bedside who fed patient.  Patient eating about 25 to 30% of meals.  Improving left upper extremity weakness.  Objective: Vitals:   05/20/22 0323 05/20/22 0326 05/20/22 0500 05/20/22 0716  BP: 140/81 140/81  113/64  Pulse: 60 60  (!) 47  Resp: '14 16  15  '$ Temp: (!) 97.5 F (36.4 C)   98 F (36.7 C)  TempSrc: Oral   Axillary  SpO2: 99% 100%  99%  Weight:   62.3 kg   Height:        Intake/Output Summary (Last 24 hours) at 05/20/2022 1131 Last data filed at 05/20/2022 0536 Gross per 24 hour  Intake 600 ml  Output 1550 ml  Net -950 ml    Filed Weights   05/17/22 0500 05/18/22 0500 05/20/22 0500  Weight: 61.1 kg 61.6 kg 62.3 kg    Examination:  General exam: Alert.  Following commands alert.   Respiratory system: Lungs clear to auscultation bilaterally.  No wheezes, no crackles, no rhonchi.  Fair air movement.  Speaking in full sentences. Cardiovascular system: Regular rate rhythm no murmurs rubs or gallops.  No JVD.  No lower extremity edema.  Gastrointestinal system: Abdomen is soft, nontender, nondistended, positive bowel sounds.  No rebound.  No guarding. Central nervous system: Alert.  Following commands..  Improved left upper extremity weakness.  Moving right upper extremity and lower extremity spontaneously/ Extremities: Left upper extremity weakness improving.  Rigidity improved.  Skin: No rashes, lesions or ulcers Psychiatry: Judgement and insight appear fair.  Mood and affect appropriate.    Data Reviewed: I have personally reviewed following labs and imaging studies  CBC: Recent Labs  Lab 05/16/22 0238 05/17/22 0439 05/18/22 0339 05/19/22 0216 05/20/22 0332  WBC 6.4 8.0 12.8* 12.4* 11.1*  NEUTROABS  --  7.1 11.5* 11.1* 9.5*  HGB 12.8 12.0 12.4 12.2 13.4  HCT 39.6 37.2 37.4 37.4 40.8  MCV 82.5 82.1 80.3  80.4 80.6  PLT 222 216 217 223 247     Basic Metabolic Panel: Recent Labs  Lab 05/15/22 0214 05/16/22 0238 05/17/22 0439 05/18/22 0339 05/19/22 0216 05/20/22 0332  NA 142 142 139 142 143 138  K 3.3* 4.0 3.8 4.2 3.6 3.6  CL 105 106 104 106 105 102  CO2 '22 26 24 25 25 25  '$ GLUCOSE 78 104* 154* 144* 119* 124*  BUN '12 9 8 13 13 12  '$ CREATININE 0.74 0.81 0.75 0.88 0.76 0.84  CALCIUM 9.8 9.8 9.5 9.7 9.5 9.6  MG 2.2 2.0  --  2.0  --   --      GFR: Estimated Creatinine Clearance: 55.2 mL/min (by C-G formula based on SCr of 0.84 mg/dL).  Liver Function Tests: No results for input(s): "AST", "ALT", "ALKPHOS", "BILITOT", "PROT", "ALBUMIN" in the last 168 hours.   CBG: Recent Labs  Lab 05/15/22 1654 05/16/22 0843 05/18/22 0826 05/19/22 0717 05/20/22 0816  GLUCAP 128* 120* 153* 114* 114*      No results found for this or any previous visit (from the past 240 hour(s)).       Radiology Studies: No results found.      Scheduled Meds:  [START ON 05/21/2022] amLODipine  2.5 mg Oral Daily   carbamazepine  200 mg Oral BID   Chlorhexidine Gluconate Cloth  6 each Topical Daily   dexamethasone (DECADRON) injection  6 mg  Intravenous Q6H   pantoprazole  40 mg Oral QHS   simvastatin  20 mg Oral Daily   Continuous Infusions:  sodium chloride 75 mL/hr at 05/19/22 2349   levETIRAcetam 500 mg (05/19/22 2349)     LOS: 7 days    Time spent: 35 minutes    Irine Seal, MD [facility]   To contact the attending provider between 7A-7P or the covering provider during after hours 7P-7A, please log into the web site [email or web address] and access using universal [place] password for that web site. If you do not have the password, please call the hospital operator.  05/20/2022, 11:31 AM

## 2022-05-21 ENCOUNTER — Other Ambulatory Visit: Payer: Self-pay | Admitting: Radiation Therapy

## 2022-05-21 DIAGNOSIS — D496 Neoplasm of unspecified behavior of brain: Secondary | ICD-10-CM | POA: Diagnosis not present

## 2022-05-21 DIAGNOSIS — I10 Essential (primary) hypertension: Secondary | ICD-10-CM | POA: Diagnosis not present

## 2022-05-21 DIAGNOSIS — E876 Hypokalemia: Secondary | ICD-10-CM | POA: Diagnosis not present

## 2022-05-21 DIAGNOSIS — C8331 Diffuse large B-cell lymphoma, lymph nodes of head, face, and neck: Secondary | ICD-10-CM

## 2022-05-21 DIAGNOSIS — E785 Hyperlipidemia, unspecified: Secondary | ICD-10-CM | POA: Diagnosis not present

## 2022-05-21 LAB — CBC
HCT: 42.1 % (ref 36.0–46.0)
Hemoglobin: 13.8 g/dL (ref 12.0–15.0)
MCH: 26.3 pg (ref 26.0–34.0)
MCHC: 32.8 g/dL (ref 30.0–36.0)
MCV: 80.3 fL (ref 80.0–100.0)
Platelets: 228 10*3/uL (ref 150–400)
RBC: 5.24 MIL/uL — ABNORMAL HIGH (ref 3.87–5.11)
RDW: 14.2 % (ref 11.5–15.5)
WBC: 10 10*3/uL (ref 4.0–10.5)
nRBC: 0 % (ref 0.0–0.2)

## 2022-05-21 LAB — GLUCOSE, CAPILLARY
Glucose-Capillary: 119 mg/dL — ABNORMAL HIGH (ref 70–99)
Glucose-Capillary: 118 mg/dL — ABNORMAL HIGH (ref 70–99)

## 2022-05-21 LAB — BASIC METABOLIC PANEL
Anion gap: 13 (ref 5–15)
BUN: 14 mg/dL (ref 8–23)
CO2: 24 mmol/L (ref 22–32)
Calcium: 9.5 mg/dL (ref 8.9–10.3)
Chloride: 102 mmol/L (ref 98–111)
Creatinine, Ser: 0.69 mg/dL (ref 0.44–1.00)
GFR, Estimated: >60 mL/min (ref >60)
Glucose, Bld: 116 mg/dL — ABNORMAL HIGH (ref 70–99)
Potassium: 3.6 mmol/L (ref 3.5–5.1)
Sodium: 139 mmol/L (ref 135–145)

## 2022-05-21 MED ORDER — KETOROLAC TROMETHAMINE 30 MG/ML IJ SOLN
15.0000 mg | Freq: Four times a day (QID) | INTRAMUSCULAR | Status: AC | PRN
  Administered 2022-05-21 – 2022-05-25 (×4): 15 mg via INTRAVENOUS
  Filled 2022-05-21 (×4): qty 1

## 2022-05-21 MED ORDER — DEXAMETHASONE 4 MG PO TABS
6.0000 mg | ORAL_TABLET | Freq: Four times a day (QID) | ORAL | Status: DC
  Administered 2022-05-21 – 2022-05-22 (×4): 6 mg via ORAL
  Filled 2022-05-21 (×5): qty 2

## 2022-05-21 MED ORDER — SODIUM CHLORIDE 0.9 % IV SOLN: INTRAVENOUS | Status: AC

## 2022-05-21 MED ORDER — LEVETIRACETAM 500 MG PO TABS
500.0000 mg | ORAL_TABLET | Freq: Two times a day (BID) | ORAL | Status: DC
  Administered 2022-05-21: 500 mg via ORAL
  Filled 2022-05-21 (×2): qty 1

## 2022-05-21 NOTE — Progress Notes (Signed)
PT Cancellation Note  Patient Details Name: April Saunders MRN: [id number] DOB: 1955/12/03   Cancelled Treatment:    Reason Eval/Treat Not Completed: Fatigue/lethargy limiting ability to participate at this time. Pt given pain medicine around 10AM and was minimally responsive to sternal rub only at this time. Will continue to attempt when pt will be better able to participate in session.   West Carbo, PT, DPT   Acute Rehabilitation Department   Sandra Cockayne 05/21/2022, 2:00 PM

## 2022-05-21 NOTE — Progress Notes (Signed)
PROGRESS NOTE    April Saunders  GGY:[id number] DOB: 1956-08-20 DOA: 05/13/2022 PCP: Willene Hatchet, NP    Chief Complaint  Patient presents with   Weakness    Brief Narrative:  April Saunders is a 66 y.o. female with medical history significant of hypertension, hyperlipidemia, degenerative joint disease, obesity presented hospital with fatigue, weakness for 3 to 4 weeks with difficulty balancing himself  and falls.  In the ED, patient had left upper extremity deficit.  Further work-up in the ED showed tumor in the right deep gray matter with extensive vasogenic edema and possible lesion in the right cerebellum. Case was discussed with  neurosurgery.  Patient was then initiated on dexamethasone and Keppra and was admitted to the hospital for further evaluation and treatment.    After hospitalization, patient has been followed by neurosurgery.  CT scan of the brain brain protocol showed enhancing lesions in the brain.  CT scan of the chest abdomen and pelvis done 05/15/2022 without any evidence of lymphadenopathy   Assessment & Plan:   Principal Problem:   Brain tumor ([place]) Active Problems:   Hypertension   Dyslipidemia   Hypokalemia   #1 multifocal brain tumors with associated vasogenic edema left-sided weakness -Patient had presented with generalized weakness, fatigue, difficulty balancing herself as well as falls with left upper extremity deficit. -MRI brain showed multifocal infiltrative and hypercellular enhancing brain masses involving both cerebral hemispheres, brainstem and cerebellum with concerns for CNS lymphoma.  Extensive brain and brainstem edema with midline shift of 7 mm noted. 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 -CT abdomen chest and pelvis with no findings of lymphadenopathy suggest suggest lymphoma. -Patient seen in  consultation by neurosurgery who recommended Decadron and Keppra. -Patient subsequently underwent stereotactic brain biopsy per neurosurgery, Dr. Marcello Moores, 05/16/2022. 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 -Repeat CT done 05/17/2022 with expected blood products and gas around the right basal ganglia mass after biopsy.  Continued pronounced vasogenic edema, midline shift has increased to 7 mm. -Case discussed with neuro-oncology, Dr. Mickeal Skinner who is aware of patient and patient will be presented on tumor board on Monday, 05/21/2022 and outpatient follow-up will be scheduled for patient. -Continue current dose of high-dose IV Decadron and changed to oral Decadron.   -Per neurosurgery.  2.  Hyperlipidemia -Continue statin.    3.  Mild hypokalemia -Patient noted to be on chronic potassium supplementation prior to admission. -Potassium repleted currently at 3.6, magnesium at 2.0.  4.  Hypertension -BP improving.  Antihypertensive medications of HCTZ and amlodipine on hold.   -Patient noted to have been placed on a Cardene drip unsure as to why however patient noted the morning of 05/18/2022, to be hypotensive requiring fluid boluses.   -Hypotension improved, and patient started on Norvasc 5 mg daily and dose decreased to 2.5 mg daily..   -Follow.  5.  History of trigeminal neuralgia -Continue carbamazepine.    DVT prophylaxis: Lovenox>>>> SCDs Code Status: Full Family Communication: Updated patient,, no family at bedside.   Disposition: ??  CIR  Status is: Inpatient Remains inpatient appropriate because: Severity of illness   Consultants:  Neurosurgery: Dr. Fenton Malling, NP neurosurgery 05/13/2022 Curb sided neuro-oncology: Dr. Mickeal Skinner 05/19/2022  Procedures:  CT head 05/13/2022, 05/17/2022 CT chest abdomen and pelvis 05/15/2022 MRI brain 05/13/2022 CT BrainLab head 05/14/2022 Stereotactic brain biopsy application of cranial navigation procedure note per Dr.  Thomas neurosurgery 05/16/2022.   Antimicrobials:  None   Subjective: Patient sleeping deeply.  Just received some pain medications of IV Dilaudid and Tylenol.  Moves extremities to sternal rub only.  Protecting airway.   Objective: Vitals:   05/21/22 0320 05/21/22 0453 05/21/22 0721 05/21/22 1153  BP: 97/84  117/87 (!) 109/56  Pulse: 68  68 (!) 51  Resp: '19  18 10  '$ Temp: 98.7 F (37.1 C)  98.3 F (36.8 C) 97.8 F (36.6 C)  TempSrc: Oral   Oral  SpO2: 99%  100% 97%  Weight:  61.1 kg    Height:        Intake/Output Summary (Last 24 hours) at 05/21/2022 1223 Last data filed at 05/21/2022 0900 Gross per 24 hour  Intake 240 ml  Output 1350 ml  Net -1110 ml    Filed Weights   05/18/22 0500 05/20/22 0500 05/21/22 0453  Weight: 61.6 kg 62.3 kg 61.1 kg    Examination:  General exam: Sleeping deeply. Respiratory system: CTA B anterior lung fields.  No wheezes, no crackles, no rhonchi.   Cardiovascular system: RRR no murmurs rubs or gallops.  No JVD.  No lower extremity edema.  Gastrointestinal system: Abdomen is soft, nontender, nondistended, positive bowel sounds.  No rebound.  No guarding.   Central nervous system: Sleeping deeply.  Moving extremities spontaneously.  Extremities: Left upper extremity weakness improving.  Rigidity improved.  Skin: No rashes, lesions or ulcers Psychiatry: Judgement and insight unable to assess.  Mood and affect appropriate.    Data Reviewed: I have personally reviewed following labs and imaging studies  CBC: Recent Labs  Lab 05/17/22 0439 05/18/22 0339 05/19/22 0216 05/20/22 0332 05/21/22 0329  WBC 8.0 12.8* 12.4* 11.1* 10.0  NEUTROABS 7.1 11.5* 11.1* 9.5*  --   HGB 12.0 12.4 12.2 13.4 13.8  HCT 37.2 37.4 37.4 40.8 42.1  MCV 82.1 80.3 80.4 80.6 80.3  PLT 216 217 223 247 228     Basic Metabolic Panel: Recent Labs  Lab 05/15/22 0214 05/16/22 0238 05/17/22 0439 05/18/22 0339 05/19/22 0216 05/20/22 0332 05/21/22 0329   NA 142 142 139 142 143 138 139  K 3.3* 4.0 3.8 4.2 3.6 3.6 3.6  CL 105 106 104 106 105 102 102  CO2 '22 26 24 25 25 25 24  '$ GLUCOSE 78 104* 154* 144* 119* 124* 116*  BUN '12 9 8 13 13 12 14  '$ CREATININE 0.74 0.81 0.75 0.88 0.76 0.84 0.69  CALCIUM 9.8 9.8 9.5 9.7 9.5 9.6 9.5  MG 2.2 2.0  --  2.0  --   --   --      GFR: Estimated Creatinine Clearance: 58 mL/min (by C-G formula based on SCr of 0.69 mg/dL).  Liver Function Tests: No results for input(s): "AST", "ALT", "ALKPHOS", "BILITOT", "PROT", "ALBUMIN" in the last 168 hours.   CBG: Recent Labs  Lab 05/16/22 0843 05/18/22 0826 05/19/22 0717 05/20/22 0816 05/21/22 0722  GLUCAP 120* 153* 114* 114* 119*      No results found for this or any previous visit (from the past 240 hour(s)).       Radiology Studies: No results found.      Scheduled Meds:  amLODipine  2.5 mg Oral Daily   carbamazepine  200 mg Oral BID   Chlorhexidine Gluconate Cloth  6 each Topical Daily   dexamethasone  6 mg Oral Q6H   pantoprazole  40 mg Oral QHS   simvastatin  20 mg Oral Daily  Continuous Infusions:  sodium chloride 75 mL/hr at 05/19/22 2349   levETIRAcetam 500 mg (05/21/22 1150)     LOS: 8 days    Time spent: 35 minutes    Irine Seal, MD [facility]   To contact the attending provider between 7A-7P or the covering provider during after hours 7P-7A, please log into the web site [email or web address] and access using universal [place] password for that web site. If you do not have the password, please call the hospital operator.  05/21/2022, 12:23 PM

## 2022-05-21 NOTE — Progress Notes (Signed)
Inpatient Rehab Admissions Coordinator:  Attempted to discuss CIR with patient, however she is too sleepy. Will return tomorrow to meet with patient and discuss.   Amanda Cockayne, PT, [facility] Admissions Coordinator 05/21/22,3:00 PM

## 2022-05-21 NOTE — Care Management Important Message (Signed)
Important Message  Patient Details  Name: April Saunders MRN: [id number] Date of Birth: 1956-03-31   Medicare Important Message Given:  Yes     [name] 05/21/2022, 3:35 PM

## 2022-05-21 NOTE — Progress Notes (Signed)
SLP Cancellation Note  Patient Details Name: Temiloluwa Recchia MRN: [id number] DOB: 1956/07/11   Cancelled treatment:       Reason Eval/Treat Not Completed: Patient's level of consciousness. SLP unable to awaken pt.    Nereyda Bowler, Katherene Ponto 05/21/2022, 11:21 AM

## 2022-05-21 NOTE — Progress Notes (Signed)
Occupational Therapy Treatment Patient Details Name: April Saunders MRN: [id number] DOB: 1956-09-14 Today's Date: 05/21/2022   History of present illness The pt is a 66 yo female presenting 7/2 with increased weakness and falls in last 3 weeks. CT revealed a 2.5 cm hypercellular mass with extensive vasogenic edema centered at the right deep gray nuclei with possible 2nd lesion in the right cerebellum. Pt now s/p stereotactic brain biopsy on 7/5. PMH includes: DJD, HLD, HTN, and trigenimal neuralgia.   OT comments  Pt very lethargic today. Pt totalA +2 for bed mobility from supine <-> sitting EOB with zero sitting balance at EOB. Pt's body with forward lean and requiring modA to maxA with cues to realign. Pt opening eyes 2-3 seconds at a time x2 times with R gaze and midline gaze. Pt saying "hello" and "hi" following ability to make a fist and stretch hand out. Pt very limited by lethargy. Pt asked to wipe face after washcloth placed in L hand and pt used R hand to move across face x1 time with mitt on. Mitt taken off. Hand squeeze and opening hand x1 time with R hand; otherwise, pt not following commands. Pt requires continued OT skilled services. OT following acutely.   Recommendations for follow up therapy are one component of a multi-disciplinary discharge planning process, led by the attending physician.  Recommendations may be updated based on patient status, additional functional criteria and insurance authorization.    Follow Up Recommendations  Acute inpatient rehab (3hours/day)    Assistance Recommended at Discharge Frequent or constant Supervision/Assistance  Patient can return home with the following  Two people to help with walking and/or transfers;A lot of help with bathing/dressing/bathroom;Direct supervision/assist for medications management;Direct supervision/assist for financial management;Assist for transportation;Assistance with cooking/housework;Help with stairs or ramp for  entrance;Assistance with feeding   Equipment Recommendations  Wheelchair (measurements OT);Wheelchair cushion (measurements OT);Tub/shower bench;Hospital bed    Recommendations for Other Services Rehab consult    Precautions / Restrictions Precautions Precautions: Fall Restrictions Weight Bearing Restrictions: No       Mobility Bed Mobility Overal bed mobility: Needs Assistance Bed Mobility: Supine to Sit     Supine to sit: Total assist, +2 for safety/equipment     General bed mobility comments: pt with limited attempt to assist, totalA to complete movement and maintain balance sitting EOB    Transfers                   General transfer comment: unsafe due to level of arousal     Balance Overall balance assessment: Needs assistance Sitting-balance support: Single extremity supported, Feet supported Sitting balance-Leahy Scale: Poor     Standing balance support: Bilateral upper extremity supported Standing balance-Leahy Scale: Zero Standing balance comment: dependent on UE support and mod-maxA                           ADL either performed or assessed with clinical judgement   ADL Overall ADL's : Needs assistance/impaired                                     Functional mobility during ADLs: Total assistance;+2 for physical assistance;+2 for safety/equipment General ADL Comments: Pt asked to wipe face after washcloth placed in L hand and pt used R hand to move across face x1 time with mitt on. Mitt taken off. Hand squeeze and opening  hand x1 time with R hand; otherwise, pt not following commands.    Extremity/Trunk Assessment Upper Extremity Assessment Upper Extremity Assessment: LUE deficits/detail LUE Deficits / Details: patient holding her L arm in a flexed position, difficult to resistance versus active tone.  Not moving her L arm away from her body.            Vision   Vision Assessment?: Yes;Vision impaired- to be  further tested in functional context Tracking/Visual Pursuits: Impaired - to be further tested in functional context Visual Fields: Impaired-to be further tested in functional context Additional Comments: no consistently opening eyes for visual assessment- stare to R and stare to midline for 2-3 seconds 1x each.   Perception Perception Perception: Impaired   Praxis Praxis Praxis: Impaired    Cognition Arousal/Alertness: Lethargic (questioning if due to medications, was 4 hours after pain medicine) Behavior During Therapy: Flat affect Overall Cognitive Status: Difficult to assess                                 General Comments: Pt opening eyes 2-3 seconds at a time x2 times with R gaze and midline gaze. Pt saying "hello" and "hi" following ability to make a fist and stretch hand out. Pt very limited by lethargy.        Exercises      Shoulder Instructions       General Comments VSS on RA. Pt not blinking to threat.    Pertinent Vitals/ Pain       Pain Assessment Pain Assessment: Faces Faces Pain Scale: No hurt Pain Intervention(s): Monitored during session  Home Living                                          Prior Functioning/Environment              Frequency  Min 2X/week        Progress Toward Goals  OT Goals(current goals can now be found in the care plan section)  Progress towards OT goals: Progressing toward goals  Acute Rehab OT Goals Patient Stated Goal: to go to rehab OT Goal Formulation: Patient unable to participate in goal setting Time For Goal Achievement: 06/02/22 Potential to Achieve Goals: [place] Discharge plan remains appropriate    Co-evaluation    PT/OT/SLP Co-Evaluation/Treatment: Yes Reason for Co-Treatment: Necessary to address cognition/behavior during functional activity PT goals addressed during session: Mobility/safety with mobility OT goals addressed during session: ADL's and  self-care;Other (comment)      AM-PAC OT "6 Clicks" Daily Activity     Outcome Measure   Help from another person eating meals?: Total Help from another person taking care of personal grooming?: Total Help from another person toileting, which includes using toliet, bedpan, or urinal?: Total Help from another person bathing (including washing, rinsing, drying)?: Total Help from another person to put on and taking off regular upper body clothing?: Total Help from another person to put on and taking off regular lower body clothing?: Total 6 Click Score: 6    End of Session    OT Visit Diagnosis: Unsteadiness on feet (R26.81);Muscle weakness (generalized) (M62.81);Ataxia, unspecified (R27.0);Other symptoms and signs involving cognitive function;Hemiplegia and hemiparesis;Low vision, both eyes (H54.2) Hemiplegia - Right/Left: Left Hemiplegia - dominant/non-dominant: Non-Dominant Hemiplegia - caused by: Other cerebrovascular disease  Activity Tolerance Patient limited by lethargy   Patient Left in bed;with call bell/phone within reach;with bed alarm set   Nurse Communication Mobility status        Time: 1410-1435 OT Time Calculation (min): 25 min  Charges: OT General Charges $OT Visit: 1 Visit OT Treatments $Neuromuscular Re-education: 8-22 mins  Jefferey Pica, OTR/L Acute Rehabilitation Services Office: [phone]   [id number] 05/21/2022, 3:26 PM

## 2022-05-21 NOTE — Progress Notes (Signed)
Physical Therapy Treatment Patient Details Name: April Saunders MRN: [id number] DOB: [birth year] Today's Date: 05/21/2022   History of Present Illness The pt is a 66 yo female presenting 7/2 with increased weakness and falls in last 3 weeks. CT revealed a 2.5 cm hypercellular mass with extensive vasogenic edema centered at the right deep gray nuclei with possible 2nd lesion in the right cerebellum. Pt now s/p stereotactic brain biopsy on 7/5. PMH includes: DJD, HLD, HTN, and trigenimal neuralgia.    PT Comments    Attempted this afternoon to progress with OOB mobility, but pt remained lethargic and with only short bouts of alertness despite attempts to change position, max environmental and auditory stimulation. The pt stated "hello" initially upon arrival of therapists, but then did not answer any other questions or give any other verbal responses at this time. No visual tracking noted when pt did open her eyes. Will continue to benefit from skilled PT acutely and following d/c to maximize functional recovery.    Recommendations for follow up therapy are one component of a multi-disciplinary discharge planning process, led by the attending physician.  Recommendations may be updated based on patient status, additional functional criteria and insurance authorization.  Follow Up Recommendations  Acute inpatient rehab (3hours/day)     Assistance Recommended at Discharge Frequent or constant Supervision/Assistance  Patient can return home with the following A lot of help with walking and/or transfers;A lot of help with bathing/dressing/bathroom;Assistance with cooking/housework;Assistance with feeding;Direct supervision/assist for medications management;Direct supervision/assist for financial management;Assist for transportation;Help with stairs or ramp for entrance   Equipment Recommendations  Wheelchair (measurements PT);Wheelchair cushion (measurements PT)    Recommendations for Other Services        Precautions / Restrictions Precautions Precautions: Fall Restrictions Weight Bearing Restrictions: No     Mobility  Bed Mobility Overal bed mobility: Needs Assistance Bed Mobility: Supine to Sit     Supine to sit: Total assist, +2 for safety/equipment     General bed mobility comments: pt with limited attempt to assist, totalA to complete movement and maintain balance sitting EOB    Transfers                   General transfer comment: unsafe due to level of arousal        Balance Overall balance assessment: Needs assistance Sitting-balance support: Single extremity supported, Feet supported Sitting balance-Leahy Scale: Poor     Standing balance support: Bilateral upper extremity supported Standing balance-Leahy Scale: Zero Standing balance comment: dependent on UE support and mod-maxA                            Cognition Arousal/Alertness: Lethargic (questioning if due to medications, was 4 hours after pain medicine) Behavior During Therapy: Flat affect Overall Cognitive Status: Difficult to assess                                 General Comments: unable to assess at this time due to lack of response and lethargy, pt intermittently opening eyes to auditory stimulation, not maintainint eyes open or visually tracking at this time. Pt did not offer verbal response other than initial "hello" upon arrival of therapist        Exercises      General Comments General comments (skin integrity, edema, etc.): VSS on RA, pt not blinking to threat, intermittently more alert but not  maintaining      Pertinent Vitals/Pain Pain Assessment Pain Assessment: Faces Faces Pain Scale: No hurt Pain Intervention(s): Monitored during session     PT Goals (current goals can now be found in the care plan section) Acute Rehab PT Goals Patient Stated Goal: none stated PT Goal Formulation: Patient unable to participate in goal setting Time  For Goal Achievement: 06/02/22 Potential to Achieve Goals: Good Progress towards PT goals: Progressing toward goals    Frequency    Min 4X/week      PT Plan Current plan remains appropriate    Co-evaluation PT/OT/SLP Co-Evaluation/Treatment: Yes Reason for Co-Treatment: Necessary to address cognition/behavior during functional activity;For patient/therapist safety;To address functional/ADL transfers PT goals addressed during session: Mobility/safety with mobility;Balance;Strengthening/ROM        AM-PAC PT "6 Clicks" Mobility   Outcome Measure  Help needed turning from your back to your side while in a flat bed without using bedrails?: Total Help needed moving from lying on your back to sitting on the side of a flat bed without using bedrails?: Total Help needed moving to and from a bed to a chair (including a wheelchair)?: Total Help needed standing up from a chair using your arms (e.g., wheelchair or bedside chair)?: Total Help needed to walk in hospital room?: Total Help needed climbing 3-5 steps with a railing? : Total 6 Click Score: 6    End of Session Equipment Utilized During Treatment: Gait belt Activity Tolerance: Patient limited by lethargy Patient left: in bed;with bed alarm set;with call bell/phone within reach Nurse Communication: Mobility status PT Visit Diagnosis: Other abnormalities of gait and mobility (R26.89);Muscle weakness (generalized) (M62.81);Difficulty in walking, not elsewhere classified (R26.2)     Time: 2876-8115 PT Time Calculation (min) (ACUTE ONLY): 19 min  Charges:  $Therapeutic Activity: 8-22 mins                     West Carbo, PT, DPT   Acute Rehabilitation Department   Sandra Cockayne 05/21/2022, 3:10 PM

## 2022-05-21 NOTE — Progress Notes (Signed)
Subjective: Patient reports that she is doing well. She has no complaints at the moment. No acute events overnight.  Objective: Vital signs in last 24 hours: Temp:  [98.3 F (36.8 C)-99.1 F (37.3 C)] 98.3 F (36.8 C) (07/10 0721) Pulse Rate:  [60-68] 68 (07/10 0721) Resp:  [16-20] 18 (07/10 0721) BP: (97-175)/(62-87) 117/87 (07/10 0721) SpO2:  [98 %-100 %] 100 % (07/10 0721) Weight:  [61.1 kg] 61.1 kg (07/10 0453)  Intake/Output from previous day: 07/09 0701 - 07/10 0700 In: -  Out: 1000 [Urine:1000] Intake/Output this shift: No intake/output data recorded.  Physical Exam: Patient is drowsy but is alert and conversant. Eyes closed. She is oriented to person and place only. She is able to open her eyes to commands and follows commands consistently on the right. She attempts to follow commands on the left with a flicker of muscle. RUE4/5, RLE 4/5, LUE 2/5, LLE 3/5. PERLA. Incision is well approximated with no drainage, erythema, or edema.    Lab Results: Recent Labs    05/20/22 0332 05/21/22 0329  WBC 11.1* 10.0  HGB 13.4 13.8  HCT 40.8 42.1  PLT 247 228   BMET Recent Labs    05/20/22 0332 05/21/22 0329  NA 138 139  K 3.6 3.6  CL 102 102  CO2 25 24  GLUCOSE 124* 116*  BUN 12 14  CREATININE 0.84 0.69  CALCIUM 9.6 9.5    Studies/Results: No results found.  Assessment/Plan: 66 y.o. female who s/p stereotactic brain biopsy with Dr. Marcello Moores on 05/16/2022. She initially presented to the ED with c/o progressively worsening fatigue, generalized weakness, and balance abnormalities. Postop CT head revealed stable and expected appearance without any acute findings. MLS approximately 59m. Lethargy is slowly improving. She follows consistent commands and has good strength on the right. Left hemiparesis persists, although slight improvement and is attempting to follow commands. Continue supportive care.      -Keppra 500 mg BID -PT/OT/SLP -Seizure precautions     LOS: 8  days     JMarvis Moeller DNP, AGNP-C Neurosurgery Nurse Practitioner  [facility] [address], [place] [id number]: [phone]   F: [phone] 05/21/2022 7:56 AM

## 2022-05-22 ENCOUNTER — Inpatient Hospital Stay (HOSPITAL_COMMUNITY): Payer: Medicare Other

## 2022-05-22 DIAGNOSIS — I10 Essential (primary) hypertension: Secondary | ICD-10-CM | POA: Diagnosis not present

## 2022-05-22 DIAGNOSIS — D496 Neoplasm of unspecified behavior of brain: Secondary | ICD-10-CM | POA: Diagnosis not present

## 2022-05-22 DIAGNOSIS — E876 Hypokalemia: Secondary | ICD-10-CM | POA: Diagnosis not present

## 2022-05-22 DIAGNOSIS — E785 Hyperlipidemia, unspecified: Secondary | ICD-10-CM | POA: Diagnosis not present

## 2022-05-22 LAB — CBC WITH DIFFERENTIAL/PLATELET
Abs Immature Granulocytes: 0.05 10*3/uL (ref 0.00–0.07)
Basophils Absolute: 0 10*3/uL (ref 0.0–0.1)
Basophils Relative: 0 %
Eosinophils Absolute: 0 10*3/uL (ref 0.0–0.5)
Eosinophils Relative: 0 %
HCT: 38.2 % (ref 36.0–46.0)
Hemoglobin: 12.6 g/dL (ref 12.0–15.0)
Immature Granulocytes: 1 %
Lymphocytes Relative: 16 %
Lymphs Abs: 1.2 10*3/uL (ref 0.7–4.0)
MCH: 26.3 pg (ref 26.0–34.0)
MCHC: 33 g/dL (ref 30.0–36.0)
MCV: 79.6 fL — ABNORMAL LOW (ref 80.0–100.0)
Monocytes Absolute: 0.6 10*3/uL (ref 0.1–1.0)
Monocytes Relative: 7 %
Neutro Abs: 5.7 10*3/uL (ref 1.7–7.7)
Neutrophils Relative %: 76 %
Platelets: 195 10*3/uL (ref 150–400)
RBC: 4.8 MIL/uL (ref 3.87–5.11)
RDW: 13.9 % (ref 11.5–15.5)
WBC: 7.4 10*3/uL (ref 4.0–10.5)
nRBC: 0 % (ref 0.0–0.2)

## 2022-05-22 LAB — BASIC METABOLIC PANEL
Anion gap: 9 (ref 5–15)
BUN: 18 mg/dL (ref 8–23)
CO2: 24 mmol/L (ref 22–32)
Calcium: 8.8 mg/dL — ABNORMAL LOW (ref 8.9–10.3)
Chloride: 104 mmol/L (ref 98–111)
Creatinine, Ser: 0.77 mg/dL (ref 0.44–1.00)
GFR, Estimated: >60 mL/min (ref >60)
Glucose, Bld: 112 mg/dL — ABNORMAL HIGH (ref 70–99)
Potassium: 3.9 mmol/L (ref 3.5–5.1)
Sodium: 137 mmol/L (ref 135–145)

## 2022-05-22 LAB — GLUCOSE, CAPILLARY: Glucose-Capillary: 118 mg/dL — ABNORMAL HIGH (ref 70–99)

## 2022-05-22 MED ORDER — LEVETIRACETAM IN NACL 500 MG/100ML IV SOLN
500.0000 mg | Freq: Two times a day (BID) | INTRAVENOUS | Status: DC
  Administered 2022-05-22 – 2022-05-23 (×4): 500 mg via INTRAVENOUS
  Filled 2022-05-22 (×4): qty 100

## 2022-05-22 MED ORDER — DEXAMETHASONE SODIUM PHOSPHATE 10 MG/ML IJ SOLN
6.0000 mg | Freq: Four times a day (QID) | INTRAMUSCULAR | Status: DC
  Administered 2022-05-22 – 2022-06-01 (×41): 6 mg via INTRAVENOUS
  Filled 2022-05-22 (×41): qty 1

## 2022-05-22 MED ORDER — SODIUM CHLORIDE 0.9 % IV SOLN
INTRAVENOUS | Status: AC
Start: 2022-05-22 — End: 2022-05-23

## 2022-05-22 MED ORDER — PANTOPRAZOLE SODIUM 40 MG IV SOLR
40.0000 mg | INTRAVENOUS | Status: DC
  Administered 2022-05-22 – 2022-05-23 (×2): 40 mg via INTRAVENOUS
  Filled 2022-05-22 (×2): qty 10

## 2022-05-22 NOTE — Progress Notes (Signed)
Inpatient Rehab Admissions Coordinator:  Attempted to visit with patient, she is too lethargic. Called daughter, April Saunders, spoke with her about potential CIR admission. Discussed goals and expectations of CIR. Patient is not currently able to participate adequately at this time. We will continue to follow. Daughter did state that she does not want her mother to go to SNF and they will try to work out caregiver support. I am not going to pursue insurance auth at this time.   Amanda Cockayne, PT, [facility] Admissions Coordinator 05/22/22,11:28 AM

## 2022-05-22 NOTE — Progress Notes (Signed)
PROGRESS NOTE    April Saunders  MEQ:[id number] DOB: 1956/07/30 DOA: 05/13/2022 PCP: Willene Hatchet, NP    Chief Complaint  Patient presents with   Weakness    Brief Narrative:  April Saunders is a 66 y.o. female with medical history significant of hypertension, hyperlipidemia, degenerative joint disease, obesity presented hospital with fatigue, weakness for 3 to 4 weeks with difficulty balancing himself  and falls.  In the ED, patient had left upper extremity deficit.  Further work-up in the ED showed tumor in the right deep gray matter with extensive vasogenic edema and possible lesion in the right cerebellum. Case was discussed with  neurosurgery.  Patient was then initiated on dexamethasone and Keppra and was admitted to the hospital for further evaluation and treatment.    After hospitalization, patient has been followed by neurosurgery.  CT scan of the brain brain protocol showed enhancing lesions in the brain.  CT scan of the chest abdomen and pelvis done 05/15/2022 without any evidence of lymphadenopathy   Assessment & Plan:   Principal Problem:   Brain tumor ([place]) Active Problems:   Hypertension   Dyslipidemia   Hypokalemia   #1 multifocal brain tumors with associated vasogenic edema left-sided weakness -Patient had presented with generalized weakness, fatigue, difficulty balancing herself as well as falls with left upper extremity deficit. -MRI brain showed multifocal infiltrative and hypercellular enhancing brain masses involving both cerebral hemispheres, brainstem and cerebellum with concerns for CNS lymphoma.  Extensive brain and brainstem edema with midline shift of 7 mm noted. 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 -CT abdomen chest and pelvis with no findings of lymphadenopathy suggest suggest lymphoma. -Patient seen in  consultation by neurosurgery who recommended Decadron and Keppra. -Patient subsequently underwent stereotactic brain biopsy per neurosurgery, Dr. Marcello Moores, 05/16/2022. 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 -Repeat CT done 05/17/2022 with expected blood products and gas around the right basal ganglia mass after biopsy.  Continued pronounced vasogenic edema, midline shift has increased to 7 mm. -Case discussed with neuro-oncology, Dr. Mickeal Skinner who is aware of patient and patient will be presented on tumor board on Monday, 05/21/2022 and outpatient follow-up will be scheduled for patient. -Was on high-dose IV Decadron and changed to oral Decadron however patient lethargic/drowsy today and as such we will change oral Decadron back to IV Decadron. -Repeat head CT due to lethargy/drowsiness. -Per neurosurgery.  2.  Hyperlipidemia -Patient currently drowsy/lethargic today and unable to receive statin.   -When mentation improves statin to be resumed.   3.  Mild hypokalemia -Patient noted to be on chronic potassium supplementation prior to admission. -Potassium repleted currently at 3.9, magnesium at 2.0.  4.  Hypertension -BP improving.  Antihypertensive medications of HCTZ and amlodipine on hold.   -Patient noted to have been placed on a Cardene drip unsure as to why however patient noted the morning of 05/18/2022, to be hypotensive requiring fluid boluses.   -Hypotension improved, and patient started on Norvasc 5 mg daily and dose decreased to 2.5 mg daily. -Patient currently drowsy/lethargic and unable to get oral antihypertensive medications. -BP currently stable and will monitor for now. -Follow.  5.  History of trigeminal neuralgia -Continue carbamazepine.    DVT prophylaxis: Lovenox>>>> SCDs Code Status: Full Family Communication: Updated patient,, no family at bedside.   Disposition: ??  CIR  Status is: Inpatient Remains inpatient appropriate because:  Severity  of illness   Consultants:  Neurosurgery: Dr. Fenton Malling, NP neurosurgery 05/13/2022 Curb sided neuro-oncology: Dr. Mickeal Skinner 05/19/2022  Procedures:  CT head 05/13/2022, 05/17/2022, 05/22/2022 CT chest abdomen and pelvis 05/15/2022 MRI brain 05/13/2022 CT BrainLab head 05/14/2022 Stereotactic brain biopsy application of cranial navigation procedure note per Dr. Marcello Moores neurosurgery 05/16/2022.   Antimicrobials:  None   Subjective: Sleeping deeply.  Drowsy.  Moves extremities to noxious stimuli.    Objective: Vitals:   05/21/22 1952 05/21/22 2325 05/22/22 0335 05/22/22 0834  BP: 124/75 (!) 147/73 101/74 131/60  Pulse: 62 (!) 59 (!) 50 (!) 57  Resp: '16 14 15 16  '$ Temp: 98.9 F (37.2 C) 98.4 F (36.9 C) 98.5 F (36.9 C) 98.5 F (36.9 C)  TempSrc: Oral  Axillary Oral  SpO2: 97% 97% 98% 97%  Weight:   62 kg   Height:        Intake/Output Summary (Last 24 hours) at 05/22/2022 1038 Last data filed at 05/22/2022 0600 Gross per 24 hour  Intake 1267.1 ml  Output 900 ml  Net 367.1 ml    Filed Weights   05/20/22 0500 05/21/22 0453 05/22/22 0335  Weight: 62.3 kg 61.1 kg 62 kg    Examination:  General exam: Drowsy, sleeping deeply. Respiratory system: CTA B anterior lung fields.  No wheezes, no crackles, no rhonchi.   Cardiovascular system: Regular rate rhythm no murmurs rubs or gallops.  No JVD.  No lower extremity edema.  Gastrointestinal system: Abdomen is soft, nontender, nondistended, positive bowel sounds.  No rebound.  No guarding.   Central nervous system: Sleeping deeply.  Minimal response to noxious stimuli. Extremities: Left upper extremity weakness improving.  Rigidity improving.  Skin: No rashes, lesions or ulcers Psychiatry: Judgement and insight unable to assess.  Mood and affect appropriate.    Data Reviewed: I have personally reviewed following labs and imaging studies  CBC: Recent Labs  Lab 05/17/22 0439 05/18/22 0339 05/19/22 0216 05/20/22 0332  05/21/22 0329 05/22/22 0807  WBC 8.0 12.8* 12.4* 11.1* 10.0 7.4  NEUTROABS 7.1 11.5* 11.1* 9.5*  --  5.7  HGB 12.0 12.4 12.2 13.4 13.8 12.6  HCT 37.2 37.4 37.4 40.8 42.1 38.2  MCV 82.1 80.3 80.4 80.6 80.3 79.6*  PLT 216 217 223 247 228 195     Basic Metabolic Panel: Recent Labs  Lab 05/16/22 0238 05/17/22 0439 05/18/22 0339 05/19/22 0216 05/20/22 0332 05/21/22 0329 05/22/22 0807  NA 142   < > 142 143 138 139 137  K 4.0   < > 4.2 3.6 3.6 3.6 3.9  CL 106   < > 106 105 102 102 104  CO2 26   < > '25 25 25 24 24  '$ GLUCOSE 104*   < > 144* 119* 124* 116* 112*  BUN 9   < > '13 13 12 14 18  '$ CREATININE 0.81   < > 0.88 0.76 0.84 0.69 0.77  CALCIUM 9.8   < > 9.7 9.5 9.6 9.5 8.8*  MG 2.0  --  2.0  --   --   --   --    < > = values in this interval not displayed.     GFR: Estimated Creatinine Clearance: 58 mL/min (by C-G formula based on SCr of 0.77 mg/dL).  Liver Function Tests: No results for input(s): "AST", "ALT", "ALKPHOS", "BILITOT", "PROT", "ALBUMIN" in the last 168 hours.   CBG: Recent Labs  Lab 05/19/22 0717 05/20/22 0816 05/21/22 0722 05/21/22 1502 05/22/22 0728  GLUCAP 114*  114* 119* 118* 118*      No results found for this or any previous visit (from the past 240 hour(s)).       Radiology Studies: No results found.      Scheduled Meds:  amLODipine  2.5 mg Oral Daily   carbamazepine  200 mg Oral BID   Chlorhexidine Gluconate Cloth  6 each Topical Daily   dexamethasone  6 mg Oral Q6H   levETIRAcetam  500 mg Oral BID   pantoprazole  40 mg Oral QHS   simvastatin  20 mg Oral Daily   Continuous Infusions:  sodium chloride 75 mL/hr at 05/21/22 1929     LOS: 9 days    Time spent: 35 minutes    Irine Seal, MD [facility]   To contact the attending provider between 7A-7P or the covering provider during after hours 7P-7A, please log into the web site [email or web address] and access using universal [place] password for that web site.  If you do not have the password, please call the hospital operator.  05/22/2022, 10:38 AM

## 2022-05-22 NOTE — Progress Notes (Signed)
SLP Cancellation Note  Patient Details Name: April Saunders MRN: [id number] DOB: 1956/02/17   Cancelled treatment:       Reason Eval/Treat Not Completed: Fatigue/lethargy limiting ability to participate. Pt cannot be awakened for therapy with sternal rub or family members speaking to her. Pt apparently ate breakfast this am.    Lynann Beaver 05/22/2022, 12:30 PM

## 2022-05-22 NOTE — Progress Notes (Signed)
Subjective: Patient reports more lethargic this morning. She is able to follow commands on the right side with noxious stimuli.   Objective: Vital signs in last 24 hours: Temp:  [97.8 F (36.6 C)-98.9 F (37.2 C)] 98.5 F (36.9 C) (07/11 0335) Pulse Rate:  [47-62] 50 (07/11 0335) Resp:  [10-20] 15 (07/11 0335) BP: (101-147)/(56-75) 101/74 (07/11 0335) SpO2:  [97 %-100 %] 98 % (07/11 0335) Weight:  [62 kg] 62 kg (07/11 0335)  Intake/Output from previous day: 07/10 0701 - 07/11 0700 In: 1507.1 [P.O.:720; I.V.:787.1] Out: 1250 [Urine:1250] Intake/Output this shift: No intake/output data recorded.  Physical Exam: Patient is drowsy but is alert and conversant. Eyes closed. She is oriented to person and place only. She is able to open her eyes to commands and follows commands consistently on the right. She attempts to follow commands on the left with a flicker of muscle. RUE4/5, RLE 4/5, LUE 2/5, LLE 3/5. PERLA. Incision is well approximated with no drainage, erythema, or edema.  Lab Results: Recent Labs    05/20/22 0332 05/21/22 0329  WBC 11.1* 10.0  HGB 13.4 13.8  HCT 40.8 42.1  PLT 247 228   BMET Recent Labs    05/20/22 0332 05/21/22 0329  NA 138 139  K 3.6 3.6  CL 102 102  CO2 25 24  GLUCOSE 124* 116*  BUN 12 14  CREATININE 0.84 0.69  CALCIUM 9.6 9.5    Studies/Results: No results found.  Assessment/Plan: 66 y.o. female who s/p stereotactic brain biopsy with Dr. Marcello Moores on 05/16/2022. She initially presented to the ED with c/o progressively worsening fatigue, generalized weakness, and balance abnormalities. Postop CT head revealed stable and expected appearance without any acute findings. MLS approximately 47m. She is more lethargic this morning and took repeated stimulation to interact and follow commands. Right-sided strength is stable. Left hemiparesis persists. Continue supportive care.      -Keppra 500 mg BID -PT/OT/SLP -Seizure precautions     LOS: 9  days     JMarvis Moeller DNP, AGNP-C Neurosurgery Nurse Practitioner  [facility] [id number], [place] [id number]: [phone]   F: [phone] 05/22/2022 7:46 AM

## 2022-05-22 NOTE — Progress Notes (Signed)
Unable to administer scheduled morning meds due to pt being lethargic/drowsy.  Text page sent to Dr. Grandville Silos (attending) to advise.

## 2022-05-23 DIAGNOSIS — E785 Hyperlipidemia, unspecified: Secondary | ICD-10-CM | POA: Diagnosis not present

## 2022-05-23 DIAGNOSIS — E876 Hypokalemia: Secondary | ICD-10-CM | POA: Diagnosis not present

## 2022-05-23 DIAGNOSIS — I10 Essential (primary) hypertension: Secondary | ICD-10-CM | POA: Diagnosis not present

## 2022-05-23 DIAGNOSIS — D496 Neoplasm of unspecified behavior of brain: Secondary | ICD-10-CM | POA: Diagnosis not present

## 2022-05-23 LAB — RENAL FUNCTION PANEL
Albumin: 3.2 g/dL — ABNORMAL LOW (ref 3.5–5.0)
Anion gap: 10 (ref 5–15)
BUN: 15 mg/dL (ref 8–23)
CO2: 23 mmol/L (ref 22–32)
Calcium: 8.8 mg/dL — ABNORMAL LOW (ref 8.9–10.3)
Chloride: 105 mmol/L (ref 98–111)
Creatinine, Ser: 0.9 mg/dL (ref 0.44–1.00)
GFR, Estimated: >60 mL/min (ref >60)
Glucose, Bld: 114 mg/dL — ABNORMAL HIGH (ref 70–99)
Phosphorus: 2.7 mg/dL (ref 2.5–4.6)
Potassium: 3.9 mmol/L (ref 3.5–5.1)
Sodium: 138 mmol/L (ref 135–145)

## 2022-05-23 LAB — CBC
HCT: 38.7 % (ref 36.0–46.0)
Hemoglobin: 13.1 g/dL (ref 12.0–15.0)
MCH: 26.9 pg (ref 26.0–34.0)
MCHC: 33.9 g/dL (ref 30.0–36.0)
MCV: 79.5 fL — ABNORMAL LOW (ref 80.0–100.0)
Platelets: 209 10*3/uL (ref 150–400)
RBC: 4.87 MIL/uL (ref 3.87–5.11)
RDW: 13.9 % (ref 11.5–15.5)
WBC: 11.3 10*3/uL — ABNORMAL HIGH (ref 4.0–10.5)
nRBC: 0 % (ref 0.0–0.2)

## 2022-05-23 LAB — GLUCOSE, CAPILLARY: Glucose-Capillary: 123 mg/dL — ABNORMAL HIGH (ref 70–99)

## 2022-05-23 LAB — MAGNESIUM: Magnesium: 2.1 mg/dL (ref 1.7–2.4)

## 2022-05-23 NOTE — Progress Notes (Signed)
PROGRESS NOTE    April Saunders  TZG:[id number] DOB: [birth year] DOA: 05/13/2022 PCP: Willene Hatchet, NP   Brief Narrative:  April Saunders is a 66 y.o. female with medical history significant of hypertension, hyperlipidemia, degenerative joint disease, obesity presented hospital with fatigue, weakness for 3 to 4 weeks with difficulty balancing himself  and falls.  In the ED, patient had left upper extremity deficit.  Further work-up in the ED showed tumor in the right deep gray matter with extensive vasogenic edema and possible lesion in the right cerebellum. Case was discussed with  neurosurgery.  Patient was then initiated on dexamethasone and Keppra and was admitted to the hospital for further evaluation and treatment.   During hospitalization, patient has been followed by neurosurgery.  CT scan of the brain brain protocol showed enhancing lesions in the brain.  CT scan of the chest abdomen and pelvis done 05/15/2022 without any evidence of lymphadenopathy.  She underwent a stereotactic brain biopsy with Dr. Amado Coe on 05/16/2022 and CT of the head was done yesterday due to increased lethargy which was unremarkable.  Overall she is stable and neurosurgery feels that her strength is slightly improved and current plan is to have neuro-oncology see the patient as well as continuing Keppra 500 g p.o. twice daily and likely planning for inpatient rehab.  Assessment and plan Principal Problem:   Brain tumor [facility]) Active Problems:   Hypertension   Dyslipidemia   Hypokalemia   Multifocal Brain tumors with associated vasogenic edema and left sided weakness -Neurosurgery on board.   -Patient had presented with generalized weakness, fatigue, difficulty balancing herself as well as falls with left upper extremity deficit. -MRI shows multifocal somewhat infiltrative and hypercellular enhancing brain masses involving both cerebral hemispheres and brainstem and citrate cerebellum.  Possibility of CNS lymphoma.   -Extensive brain and brainstem edema with midline shift of 7 mm was reported.   -Continuing Dexamethasone IV 6 mg every 6 and levetiracetam 1000 mg IV every 12 as well as carbamazepine 200 mg p.o. twice daily.   -CT  brain lab protocol was then performed which showed multiple brightly enhancing mass lesions throughout the infratentorial and supratentorial brain most consistent with CNS lymphoma with vasogenic edema.   -Patient subsequently underwent stereotactic brain biopsy per neurosurgery, Dr. Marcello Moores, 05/16/2022. 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 -Repeat head CT scan without contrast on 05/22/2022 showed "Marked motion degradation. No additional complication evident following biopsy of the inferior basal ganglia lesion on the right. Maximal measurement today is 2.6 cm. The study is markedly  regarded by motion and assessment for subtle change is not  possible. Regional edema on the right causes mass effect with right-to-left shift estimated at 7 mm, similar to the prior exam.  No visible edema in the inferior cerebellum on the right or in the left temporal lobe." -CT scan of the chest abdomen and pelvis with no findings of lymphadenopathy to suggest lymphoma. We will follow neurosurgery recommendations. -Continue seizure precautions -Neurooncology to evaluate in the morning and my partner had discussed the case with neurooncology Dr. Mickeal Skinner who is aware the patient and patient was presented at the tumor board and Monday, 05/21/2022 with initial recommendations for outpatient follow-up but now will be seen in patient -Follow Neurosurgery and Neuro-Oncology Recc's    Hyperlipidemia. -Continue with Simvastatin 20 mg p.o. daily  Mild Hypokalemia. -Improved.  Patient's potassium is now 3.9 -Continue monitoring for the necessary -Mag level was 2.1 -Continue to monitor and  trend and repeat CMP in a.m.  Essential Hypertension. -Continue to hold  antihypertensives for now.   -Continue to monitor blood pressure protocol -Patient is on HCTZ and amlodipine at home. -Last blood pressure reading was 132/75  History of Trigeminal Neuralgia.   -Patient takes carbamazepine at home and this has been resumed  Leukocytosis -In the setting of steroid demargination given that she is on dexamethasone -WBC went from 7.4 is now 11.3 Continue to monitor for signs and symptoms of infection; no overt infection noted -Repeat CBC in a.m.  Hyperglycemia -In the setting of steroid demargination -Continue monitor and trend CBGs and they have been ranging from 112-124 daily on BMP/CMP and CBGs ranging from 114-123 on fingersticks -If necessary will place on sensitive NovoLog cycle insulin  DVT prophylaxis: Place and maintain sequential compression device Start: 05/17/22 0749 SCDs Start: 05/16/22 1718 SCDs Start: 05/13/22 1051    Code Status: Full Code Family Communication: No family currently at bedside  Disposition Plan:  Level of care: Progressive Status is: Inpatient Remains inpatient appropriate because: Needs further neurosurgery clearance and evaluation by neurooncology   Consultants:  Neurosurgery Neuro-oncology  Procedures:  CT head 05/13/2022, 05/17/2022 CT chest abdomen and pelvis 05/15/2022 MRI brain 05/13/2022 CT [facility] head 05/14/2022 Stereotactic brain biopsy application of cranial navigation procedure note per Dr. Marcello Moores neurosurgery 05/16/2022.  Antimicrobials:  Anti-infectives (From admission, onward)    Start     Dose/Rate Route Frequency Ordered Stop   05/16/22 2000  ceFAZolin (ANCEF) IVPB 1 g/50 mL premix        1 g 100 mL/hr over 30 Minutes Intravenous Every 8 hours 05/16/22 1717 05/17/22 1445   05/16/22 0745  ceFAZolin (ANCEF) IVPB 2g/100 mL premix        2 g 200 mL/hr over 30 Minutes Intravenous On call to O.R. 05/16/22 0350 05/16/22 1210       Subjective: Seen and examined at bedside and is doing okay and was  little confused.  Had a slight headache this morning.  Only oriented to herself and place.  Denies any chest pain or lightheadedness or dizziness.  No other concerns or complaints at this time.  Objective: Vitals:   05/23/22 0355 05/23/22 0745 05/23/22 1109 05/23/22 1508  BP: 123/63 140/73 132/75 (!) 145/68  Pulse: (!) 46 (!) 56 (!) 51 62  Resp: '14 16 11 16  '$ Temp: 98.4 F (36.9 C) 97.9 F (36.6 C) 98.4 F (36.9 C)   TempSrc: Axillary Oral Axillary Oral  SpO2: 97% 98% 98% 99%  Weight: 62.5 kg     Height:        Intake/Output Summary (Last 24 hours) at 05/23/2022 1514 Last data filed at 05/23/2022 1021 Gross per 24 hour  Intake 1115 ml  Output 1500 ml  Net -385 ml   Filed Weights   05/21/22 0453 05/22/22 0335 05/23/22 0355  Weight: 61.1 kg 62 kg 62.5 kg   Examination: Physical Exam:  Constitutional: WN/WD African-American female currently in no acute distress appears calm and oriented and awake x2 Respiratory: Diminished to auscultation bilaterally, no wheezing, rales, rhonchi or crackles. Normal respiratory effort and patient is not tachypenic. No accessory muscle use.  Unlabored breathing Cardiovascular: RRR, no murmurs / rubs / gallops. S1 and S2 auscultated. No extremity edema. Abdomen: Soft, non-tender, distended secondary body habitus. Bowel sounds positive.  GU: Deferred. Musculoskeletal: No clubbing / cyanosis of digits/nails.  No joint deformities noted Skin: No rashes, lesions, ulcers on limited skin evaluation. No induration; Warm and dry.  Neurologic: CN 2-12 grossly intact with no focal deficits. Psychiatric: Normal judgment and insight. Alert and oriented x 2  Data Reviewed: I have personally reviewed following labs and imaging studies  CBC: Recent Labs  Lab 05/17/22 0439 05/18/22 0339 05/19/22 0216 05/20/22 0332 05/21/22 0329 05/22/22 0807 05/23/22 0056  WBC 8.0 12.8* 12.4* 11.1* 10.0 7.4 11.3*  NEUTROABS 7.1 11.5* 11.1* 9.5*  --  5.7  --   HGB 12.0  12.4 12.2 13.4 13.8 12.6 13.1  HCT 37.2 37.4 37.4 40.8 42.1 38.2 38.7  MCV 82.1 80.3 80.4 80.6 80.3 79.6* 79.5*  PLT 216 217 223 247 228 195 810   Basic Metabolic Panel: Recent Labs  Lab 05/18/22 0339 05/19/22 0216 05/20/22 0332 05/21/22 0329 05/22/22 0807 05/23/22 0056  NA 142 143 138 139 137 138  K 4.2 3.6 3.6 3.6 3.9 3.9  CL 106 105 102 102 104 105  CO2 '25 25 25 24 24 23  '$ GLUCOSE 144* 119* 124* 116* 112* 114*  BUN '13 13 12 14 18 15  '$ CREATININE 0.88 0.76 0.84 0.69 0.77 0.90  CALCIUM 9.7 9.5 9.6 9.5 8.8* 8.8*  MG 2.0  --   --   --   --  2.1  PHOS  --   --   --   --   --  2.7   GFR: Estimated Creatinine Clearance: 51.6 mL/min (by C-G formula based on SCr of 0.9 mg/dL). Liver Function Tests: Recent Labs  Lab 05/23/22 0056  ALBUMIN 3.2*   No results for input(s): "LIPASE", "AMYLASE" in the last 168 hours. No results for input(s): "AMMONIA" in the last 168 hours. Coagulation Profile: No results for input(s): "INR", "PROTIME" in the last 168 hours. Cardiac Enzymes: No results for input(s): "CKTOTAL", "CKMB", "CKMBINDEX", "TROPONINI" in the last 168 hours. BNP (last 3 results) No results for input(s): "PROBNP" in the last 8760 hours. HbA1C: No results for input(s): "HGBA1C" in the last 72 hours. CBG: Recent Labs  Lab 05/20/22 0816 05/21/22 0722 05/21/22 1502 05/22/22 0728 05/23/22 0747  GLUCAP 114* 119* 118* 118* 123*   Lipid Profile: No results for input(s): "CHOL", "HDL", "LDLCALC", "TRIG", "CHOLHDL", "LDLDIRECT" in the last 72 hours. Thyroid Function Tests: No results for input(s): "TSH", "T4TOTAL", "FREET4", "T3FREE", "THYROIDAB" in the last 72 hours. Anemia Panel: No results for input(s): "VITAMINB12", "FOLATE", "FERRITIN", "TIBC", "IRON", "RETICCTPCT" in the last 72 hours. Sepsis Labs: No results for input(s): "PROCALCITON", "LATICACIDVEN" in the last 168 hours.  No results found for this or any previous visit (from the past 240 hour(s)).   Radiology  Studies: CT HEAD WO CONTRAST (5MM)  Result Date: 05/22/2022 CLINICAL DATA:  Recent diagnosis of B-cell lymphoma. Prior biopsy. Mental status changes. EXAM: CT HEAD WITHOUT CONTRAST TECHNIQUE: Contiguous axial images were obtained from the base of the skull through the vertex without intravenous contrast. RADIATION DOSE REDUCTION: This exam was performed according to the departmental dose-optimization program which includes automated exposure control, adjustment of the mA and/or kV according to patient size and/or use of iterative reconstruction technique. COMPARISON:  05/17/2022 FINDINGS: Brain: Today's study suffers from considerable motion degradation. There is resolution of the previously seen edema in the right cerebellum. Mass lesion at the inferior basal ganglia on the right with intrinsic hyperdensity measures 2.6 cm in diameter. No evidence of hemorrhagic complication post biopsy. Regional edema persists relating to this lesion. Previously see lesion of the left temporal lobe is not discernible. Right-to-left shift is approximately 7 mm. No evidence of obstructive hydrocephalus. Vascular:  No vascular finding. Skull: Negative otherwise. Sinuses/Orbits: Clear/normal Other: None IMPRESSION: Marked motion degradation. No additional complication evident following biopsy of the inferior basal ganglia lesion on the right. Maximal measurement today is 2.6 cm. The study is markedly degraded by motion and assessment for subtle change is not possible. Regional edema on the right causes mass effect with right-to-left shift estimated at 7 mm, similar to the prior exam. No visible edema in the inferior cerebellum on the right or in the left temporal lobe. Electronically Signed   By: Nelson Chimes M.D.   On: 05/22/2022 12:59    Scheduled Meds:  amLODipine  2.5 mg Oral Daily   carbamazepine  200 mg Oral BID   Chlorhexidine Gluconate Cloth  6 each Topical Daily   dexamethasone (DECADRON) injection  6 mg Intravenous  Q6H   pantoprazole (PROTONIX) IV  40 mg Intravenous Q24H   simvastatin  20 mg Oral Daily   Continuous Infusions:  sodium chloride 75 mL/hr at 05/22/22 2201   levETIRAcetam 500 mg (05/23/22 1110)    LOS: 10 days   April Saunders' \\Don Tiu'$ , DO [facility] Available via Epic secure chat 7am-7pm After these hours, please refer to coverage provider listed on [email or web address] 05/23/2022, 3:14 PM

## 2022-05-23 NOTE — Progress Notes (Signed)
Physical Therapy Treatment Patient Details Name: April Saunders MRN: [id number] DOB: 08/13/1956 Today's Date: 05/23/2022   History of Present Illness The pt is a 66 yo female presenting 7/2 with increased weakness and falls in last 3 weeks. CT revealed a 2.5 cm hypercellular mass with extensive vasogenic edema centered at the right deep gray nuclei with possible 2nd lesion in the right cerebellum. Pt now s/p stereotactic brain biopsy on 7/5. PMH includes: DJD, HLD, HTN, and trigenimal neuralgia.    PT Comments    Returned as pt's visitor alerting staff that the pt was attempting to get out of bed.  She was more alert this afternoon and verbalized need to use the bathroom, the pt was then assisted with mod-maxA to stand and pivot to [facility]. She continues to need assist to steady and to block L knee in stance, and up to modA to maintain seated balance at this time due to mild restlessness and poor safety awareness. The pt was again able to progress to taking small lateral steps to L with maxA to maintain upright and to facilitate wt shift for stepping. Will continue to benefit from skilled PT to progress functional strength, stability, and capacity for OOB mobility.    Recommendations for follow up therapy are one component of a multi-disciplinary discharge planning process, led by the attending physician.  Recommendations may be updated based on patient status, additional functional criteria and insurance authorization.  Follow Up Recommendations  Acute inpatient rehab (3hours/day)     Assistance Recommended at Discharge Frequent or constant Supervision/Assistance  Patient can return home with the following A lot of help with walking and/or transfers;A lot of help with bathing/dressing/bathroom;Assistance with cooking/housework;Assistance with feeding;Direct supervision/assist for medications management;Direct supervision/assist for financial management;Assist for transportation;Help with stairs or ramp  for entrance   Equipment Recommendations  Wheelchair (measurements PT);Wheelchair cushion (measurements PT)    Recommendations for Other Services       Precautions / Restrictions Precautions Precautions: Fall Restrictions Weight Bearing Restrictions: No     Mobility  Bed Mobility Overal bed mobility: Needs Assistance Bed Mobility: Supine to Sit, Sit to Supine     Supine to sit: Mod assist Sit to supine: Mod assist   General bed mobility comments: modA to initiate and complete trunk elevation but pt able to assist with LE and pull on therapist for trunk elevation, pt assisting to elevate LE to return to bed. also looked to L with max cues to seek out pillow to lay down trunk    Transfers Overall transfer level: Needs assistance Equipment used: 1 person hand held assist Transfers: Sit to/from Stand, Bed to chair/wheelchair/BSC Sit to Stand: Mod assist Stand pivot transfers: Max assist         General transfer comment: modA to manage initial stand with pt holding PT arms in front-facing transfer. maxA to pivot to [facility] with +2 to manage briefs for pt to use commode. modA to stand from [facility]    Ambulation/Gait Ambulation/Gait assistance: Max Web designer (Feet): 4 Feet Assistive device: 1 person hand held assist Gait Pattern/deviations: Step-to pattern, Decreased step length - left, Decreased stance time - left, Decreased stride length, Knee flexed in stance - left, Knees buckling       General Gait Details: small lateral steps to L from [facility] towards bed, needs maxA to maintain balance and facilitate wt shift to move each foot. blocking of L knee with stance.     Balance Overall balance assessment: Needs assistance Sitting-balance support:  Single extremity supported, Feet supported Sitting balance-Leahy Scale: Poor     Standing balance support: Bilateral upper extremity supported Standing balance-Leahy Scale: Zero Standing balance comment: dependent on UE  support and mod-maxA                            Cognition Arousal/Alertness: Lethargic, Awake/alert Behavior During Therapy: Flat affect, Restless Overall Cognitive Status: Difficult to assess Area of Impairment: Orientation, Attention, Memory, Following commands, Safety/judgement, Awareness, Problem solving                 Orientation Level: Disoriented to, Situation, Place (states she is not sure if she was at work, but is not on vacation.) Current Attention Level: Focused Memory: Decreased short-term memory Following Commands: Follows one step commands inconsistently Safety/Judgement: Decreased awareness of safety, Decreased awareness of deficits Awareness: Intellectual Problem Solving: Slow processing, Decreased initiation, Difficulty sequencing, Requires verbal cues, Requires tactile cues General Comments: pt following simple commands, maintaining eyes open and is more engaged this afternoon. talking with friend about appropriate topics, slightly impaired memory and situational awareness. able to follow only simple cues, often needs repeating due to significant distraction        Exercises Other Exercises Other Exercises: repeated sit-stand from EOB with mod-maxA from therapist, repeated sequential cues with each rep. 2x5    General Comments General comments (skin integrity, edema, etc.): VSS on RA      Pertinent Vitals/Pain Pain Assessment Pain Assessment: Faces Faces Pain Scale: Hurts even more Pain Location: L hip Pain Descriptors / Indicators: Grimacing, Discomfort Pain Intervention(s): Limited activity within patient's tolerance, Monitored during session, Repositioned, Heat applied     PT Goals (current goals can now be found in the care plan section) Acute Rehab PT Goals Patient Stated Goal: none stated PT Goal Formulation: Patient unable to participate in goal setting Time For Goal Achievement: 06/02/22 Potential to Achieve Goals: Good Progress  towards PT goals: Progressing toward goals    Frequency    Min 4X/week      PT Plan Current plan remains appropriate       AM-PAC PT "6 Clicks" Mobility   Outcome Measure  Help needed turning from your back to your side while in a flat bed without using bedrails?: Total Help needed moving from lying on your back to sitting on the side of a flat bed without using bedrails?: Total Help needed moving to and from a bed to a chair (including a wheelchair)?: Total Help needed standing up from a chair using your arms (e.g., wheelchair or bedside chair)?: Total Help needed to walk in hospital room?: Total Help needed climbing 3-5 steps with a railing? : Total 6 Click Score: 6    End of Session Equipment Utilized During Treatment: Gait belt Activity Tolerance: Patient limited by lethargy Patient left: in bed;with bed alarm set;with call bell/phone within reach;with nursing/sitter in room;with family/visitor present Nurse Communication: Mobility status PT Visit Diagnosis: Other abnormalities of gait and mobility (R26.89);Muscle weakness (generalized) (M62.81);Difficulty in walking, not elsewhere classified (R26.2)     Time: 1740-8144 PT Time Calculation (min) (ACUTE ONLY): 19 min  Charges:  $Therapeutic Activity: 8-22 mins                    West Carbo, PT, DPT   Acute Rehabilitation Department   Sandra Cockayne 05/23/2022, 3:53 PM

## 2022-05-23 NOTE — Progress Notes (Signed)
Physical Therapy Treatment Patient Details Name: April Saunders MRN: [id number] DOB: 1956-10-28 Today's Date: 05/23/2022   History of Present Illness The pt is a 66 yo female presenting 7/2 with increased weakness and falls in last 3 weeks. CT revealed a 2.5 cm hypercellular mass with extensive vasogenic edema centered at the right deep gray nuclei with possible 2nd lesion in the right cerebellum. Pt now s/p stereotactic brain biopsy on 7/5. PMH includes: DJD, HLD, HTN, and trigenimal neuralgia.    PT Comments    The pt presents with continued flat affect, but improved alertness and participation at this time. She was able to follow simple cues and discrete commands, but needs frequent repetition due to poor attention and significant distraction from lines/looking for heating pad. The pt was able to complete repeated sit-stands with mod-maxA of 1, but benefits from front-facing transfer that allows for bilateral knee blocking and assist to facilitate anterior translation of trunk. Increased cues for posture and upright positioning, especially with fatigue. The pt was able to initiate small lateral steps with max cues, maxA to maintain balance, and assist to facilitate wt shift. Continue to recommend acute inpatient rehab when medically stable for d/c.     Recommendations for follow up therapy are one component of a multi-disciplinary discharge planning process, led by the attending physician.  Recommendations may be updated based on patient status, additional functional criteria and insurance authorization.  Follow Up Recommendations  Acute inpatient rehab (3hours/day)     Assistance Recommended at Discharge Frequent or constant Supervision/Assistance  Patient can return home with the following A lot of help with walking and/or transfers;A lot of help with bathing/dressing/bathroom;Assistance with cooking/housework;Assistance with feeding;Direct supervision/assist for medications management;Direct  supervision/assist for financial management;Assist for transportation;Help with stairs or ramp for entrance   Equipment Recommendations  Wheelchair (measurements PT);Wheelchair cushion (measurements PT)    Recommendations for Other Services       Precautions / Restrictions Precautions Precautions: Fall Restrictions Weight Bearing Restrictions: No     Mobility  Bed Mobility Overal bed mobility: Needs Assistance Bed Mobility: Supine to Sit, Sit to Supine     Supine to sit: Mod assist Sit to supine: Max assist   General bed mobility comments: modA to initiate and complete trunk elevation but pt able to assist with LE and pull on therapist for trunk elevation, increased assist due to lack of efffort with return to supine    Transfers Overall transfer level: Needs assistance Equipment used: 1 person hand held assist Transfers: Sit to/from Stand Sit to Stand: Max assist, Mod assist           General transfer comment: repeated sit-stand with bilateral knee blocked and pt holding PT arms in front-facing transfer. completed x10 in session. bilateral knees and feet blocked    Ambulation/Gait Ambulation/Gait assistance: Max assist Gait Distance (Feet): 2 Feet Assistive device: 1 person hand held assist         General Gait Details: small lateral steps to L and R, needs maxA to maintain balance and facilitate wt shift to move each foot. blocking of L knee with stance.      Balance Overall balance assessment: Needs assistance Sitting-balance support: Single extremity supported, Feet supported Sitting balance-Leahy Scale: Poor     Standing balance support: Bilateral upper extremity supported Standing balance-Leahy Scale: Zero Standing balance comment: dependent on UE support and mod-maxA  Cognition Arousal/Alertness: Lethargic, Awake/alert Behavior During Therapy: Flat affect, Restless Overall Cognitive Status: Difficult to  assess Area of Impairment: Orientation, Attention, Memory, Following commands, Safety/judgement, Awareness, Problem solving                 Orientation Level: Disoriented to, Situation, Place Current Attention Level: Focused Memory: Decreased short-term memory Following Commands: Follows one step commands inconsistently Safety/Judgement: Decreased awareness of safety, Decreased awareness of deficits Awareness: Intellectual Problem Solving: Slow processing, Decreased initiation, Difficulty sequencing, Requires verbal cues, Requires tactile cues General Comments: pt following simple commands, maintaining eyes closed at times but is able to open and interact today. remembers therapist from prior session, but needed repeated cues that she does not have her heating pad from home at this time. pt remained restless and reaching for heating pad through session. able to follow only simple cues, often needs repeating due to significant distraction        Exercises Other Exercises Other Exercises: repeated sit-stand from EOB with mod-maxA from therapist, repeated sequential cues with each rep. 2x5    General Comments General comments (skin integrity, edema, etc.): VSS on RA      Pertinent Vitals/Pain Pain Assessment Pain Assessment: Faces Faces Pain Scale: Hurts little more Pain Location: L hip Pain Descriptors / Indicators: Grimacing, Discomfort Pain Intervention(s): Monitored during session, Repositioned, Heat applied     PT Goals (current goals can now be found in the care plan section) Acute Rehab PT Goals Patient Stated Goal: none stated PT Goal Formulation: Patient unable to participate in goal setting Time For Goal Achievement: 06/02/22 Potential to Achieve Goals: Good Progress towards PT goals: Progressing toward goals    Frequency    Min 4X/week      PT Plan Current plan remains appropriate       AM-PAC PT "6 Clicks" Mobility   Outcome Measure  Help needed  turning from your back to your side while in a flat bed without using bedrails?: Total Help needed moving from lying on your back to sitting on the side of a flat bed without using bedrails?: Total Help needed moving to and from a bed to a chair (including a wheelchair)?: Total Help needed standing up from a chair using your arms (e.g., wheelchair or bedside chair)?: Total Help needed to walk in hospital room?: Total Help needed climbing 3-5 steps with a railing? : Total 6 Click Score: 6    End of Session Equipment Utilized During Treatment: Gait belt Activity Tolerance: Patient limited by lethargy Patient left: in bed;with bed alarm set;with call bell/phone within reach Nurse Communication: Mobility status PT Visit Diagnosis: Other abnormalities of gait and mobility (R26.89);Muscle weakness (generalized) (M62.81);Difficulty in walking, not elsewhere classified (R26.2)     Time: 4010-2725 PT Time Calculation (min) (ACUTE ONLY): 20 min  Charges:  $Therapeutic Exercise: 8-22 mins                     West Carbo, PT, DPT   Acute Rehabilitation Department   Sandra Cockayne 05/23/2022, 12:16 PM

## 2022-05-23 NOTE — Progress Notes (Signed)
Subjective: Patient reports that "I'm feeling good." She has a mild headache. No acute events overnight.   Objective: Vital signs in last 24 hours: Temp:  [97.9 F (36.6 C)-98.7 F (37.1 C)] 97.9 F (36.6 C) (07/12 0745) Pulse Rate:  [46-58] 56 (07/12 0745) Resp:  [14-16] 16 (07/12 0745) BP: (123-153)/(60-73) 140/73 (07/12 0745) SpO2:  [97 %-98 %] 98 % (07/12 0745) Weight:  [62.5 kg] 62.5 kg (07/12 0355)  Intake/Output from previous day: 07/11 0701 - 07/12 0700 In: 875 [I.V.:675; IV Piggyback:200] Out: 1500 [Urine:1500] Intake/Output this shift: No intake/output data recorded.  Physical Exam: Patient is much more alert and interactive this morning. She is oriented to person and place only. She is able to open her eyes spontaneously. She is consistently following commands in BUE and BLE. RUE4/5, RLE 4/5, LUE 3/5, LLE 3/5. PERLA. Incision is well approximated with no drainage, erythema, or edema.    Lab Results: Recent Labs    05/22/22 0807 05/23/22 0056  WBC 7.4 11.3*  HGB 12.6 13.1  HCT 38.2 38.7  PLT 195 209   BMET Recent Labs    05/22/22 0807 05/23/22 0056  NA 137 138  K 3.9 3.9  CL 104 105  CO2 24 23  GLUCOSE 112* 114*  BUN 18 15  CREATININE 0.77 0.90  CALCIUM 8.8* 8.8*    Studies/Results: CT HEAD WO CONTRAST (5MM)  Result Date: 05/22/2022 CLINICAL DATA:  Recent diagnosis of B-cell lymphoma. Prior biopsy. Mental status changes. EXAM: CT HEAD WITHOUT CONTRAST TECHNIQUE: Contiguous axial images were obtained from the base of the skull through the vertex without intravenous contrast. RADIATION DOSE REDUCTION: This exam was performed according to the departmental dose-optimization program which includes automated exposure control, adjustment of the mA and/or kV according to patient size and/or use of iterative reconstruction technique. COMPARISON:  05/17/2022 FINDINGS: Brain: Today's study suffers from considerable motion degradation. There is resolution of the  previously seen edema in the right cerebellum. Mass lesion at the inferior basal ganglia on the right with intrinsic hyperdensity measures 2.6 cm in diameter. No evidence of hemorrhagic complication post biopsy. Regional edema persists relating to this lesion. Previously see lesion of the left temporal lobe is not discernible. Right-to-left shift is approximately 7 mm. No evidence of obstructive hydrocephalus. Vascular: No vascular finding. Skull: Negative otherwise. Sinuses/Orbits: Clear/normal Other: None IMPRESSION: Marked motion degradation. No additional complication evident following biopsy of the inferior basal ganglia lesion on the right. Maximal measurement today is 2.6 cm. The study is markedly degraded by motion and assessment for subtle change is not possible. Regional edema on the right causes mass effect with right-to-left shift estimated at 7 mm, similar to the prior exam. No visible edema in the inferior cerebellum on the right or in the left temporal lobe. Electronically Signed   By: Nelson Chimes M.D.   On: 05/22/2022 12:59    Assessment/Plan: 66 y.o. female who s/p stereotactic brain biopsy with Dr. Marcello Moores on 05/16/2022. CT head performed yesterday due to increase lethargy was grossly unremarkable. It was motion degraded and difficult to assess for minor changes. Overall stable. This morning she is much more alert and appropriately interactive. Her LUE and LLE strength has slightly improved from yesterday. Right-sided strength is stable. I called the patient's daughter and updated her on the patient's current status and tentative plan moving forward. Neuro oncology to see patient tomorrow. Continue supportive care.      -Keppra 500 mg BID -PT/OT/SLP -Seizure precautions  LOS: 10 days     Marvis Moeller, DNP, AGNP-C Neurosurgery Nurse Practitioner  [facility] [place] [address], [address], [place], [place] [address] P: [phone]    F:  [phone]  05/23/2022 8:01 AM

## 2022-05-24 DIAGNOSIS — D496 Neoplasm of unspecified behavior of brain: Secondary | ICD-10-CM | POA: Diagnosis not present

## 2022-05-24 DIAGNOSIS — I10 Essential (primary) hypertension: Secondary | ICD-10-CM | POA: Diagnosis not present

## 2022-05-24 DIAGNOSIS — E785 Hyperlipidemia, unspecified: Secondary | ICD-10-CM | POA: Diagnosis not present

## 2022-05-24 LAB — CBC WITH DIFFERENTIAL/PLATELET
Abs Immature Granulocytes: 0.07 10*3/uL (ref 0.00–0.07)
Basophils Absolute: 0 10*3/uL (ref 0.0–0.1)
Basophils Relative: 0 %
Eosinophils Absolute: 0 10*3/uL (ref 0.0–0.5)
Eosinophils Relative: 0 %
HCT: 35.5 % — ABNORMAL LOW (ref 36.0–46.0)
Hemoglobin: 12.1 g/dL (ref 12.0–15.0)
Immature Granulocytes: 1 %
Lymphocytes Relative: 7 %
Lymphs Abs: 0.9 10*3/uL (ref 0.7–4.0)
MCH: 26.7 pg (ref 26.0–34.0)
MCHC: 34.1 g/dL (ref 30.0–36.0)
MCV: 78.4 fL — ABNORMAL LOW (ref 80.0–100.0)
Monocytes Absolute: 0.5 10*3/uL (ref 0.1–1.0)
Monocytes Relative: 4 %
Neutro Abs: 11 10*3/uL — ABNORMAL HIGH (ref 1.7–7.7)
Neutrophils Relative %: 88 %
Platelets: 212 10*3/uL (ref 150–400)
RBC: 4.53 MIL/uL (ref 3.87–5.11)
RDW: 13.7 % (ref 11.5–15.5)
WBC: 12.4 10*3/uL — ABNORMAL HIGH (ref 4.0–10.5)
nRBC: 0 % (ref 0.0–0.2)

## 2022-05-24 LAB — COMPREHENSIVE METABOLIC PANEL
ALT: 43 U/L (ref 0–44)
AST: 27 U/L (ref 15–41)
Albumin: 3 g/dL — ABNORMAL LOW (ref 3.5–5.0)
Alkaline Phosphatase: 42 U/L (ref 38–126)
Anion gap: 9 (ref 5–15)
BUN: 12 mg/dL (ref 8–23)
CO2: 24 mmol/L (ref 22–32)
Calcium: 9 mg/dL (ref 8.9–10.3)
Chloride: 104 mmol/L (ref 98–111)
Creatinine, Ser: 0.67 mg/dL (ref 0.44–1.00)
GFR, Estimated: >60 mL/min (ref >60)
Glucose, Bld: 132 mg/dL — ABNORMAL HIGH (ref 70–99)
Potassium: 3.8 mmol/L (ref 3.5–5.1)
Sodium: 137 mmol/L (ref 135–145)
Total Bilirubin: 0.7 mg/dL (ref 0.3–1.2)
Total Protein: 5.5 g/dL — ABNORMAL LOW (ref 6.5–8.1)

## 2022-05-24 LAB — PHOSPHORUS: Phosphorus: 2.9 mg/dL (ref 2.5–4.6)

## 2022-05-24 LAB — MAGNESIUM: Magnesium: 1.9 mg/dL (ref 1.7–2.4)

## 2022-05-24 LAB — GLUCOSE, CAPILLARY: Glucose-Capillary: 136 mg/dL — ABNORMAL HIGH (ref 70–99)

## 2022-05-24 MED ORDER — PANTOPRAZOLE SODIUM 40 MG PO TBEC
40.0000 mg | DELAYED_RELEASE_TABLET | Freq: Every day | ORAL | Status: DC
  Administered 2022-05-24 – 2022-06-01 (×9): 40 mg via ORAL
  Filled 2022-05-24 (×9): qty 1

## 2022-05-24 MED ORDER — LEVETIRACETAM 500 MG PO TABS
500.0000 mg | ORAL_TABLET | Freq: Two times a day (BID) | ORAL | Status: DC
  Administered 2022-05-24 – 2022-06-01 (×17): 500 mg via ORAL
  Filled 2022-05-24 (×17): qty 1

## 2022-05-24 NOTE — Progress Notes (Signed)
PROGRESS NOTE    April Saunders  NUU:[id number] DOB: 12-18-1955 DOA: 05/13/2022 PCP: Willene Hatchet, NP   Brief Narrative:  April Saunders is a 66 y.o. female with medical history significant of hypertension, hyperlipidemia, degenerative joint disease, obesity presented hospital with fatigue, weakness for 3 to 4 weeks with difficulty balancing himself  and falls.  In the ED, patient had left upper extremity deficit.  Further work-up in the ED showed tumor in the right deep gray matter with extensive vasogenic edema and possible lesion in the right cerebellum. Case was discussed with  neurosurgery.  Patient was then initiated on dexamethasone and Keppra and was admitted to the hospital for further evaluation and treatment.   During hospitalization, patient has been followed by neurosurgery.  CT scan of the brain brain protocol showed enhancing lesions in the brain.  CT scan of the chest abdomen and pelvis done 05/15/2022 without any evidence of lymphadenopathy.  She underwent a stereotactic brain biopsy with Dr. Amado Coe on 05/16/2022 and CT of the head was done yesterday due to increased lethargy which was unremarkable.  Overall she is currently stable though her prognosis is extremely poor.  Neurosurgery felt that her strength is slightly improved and current plan is to have neuro-oncology see the patient as well as continuing Keppra 500 g p.o. twice daily and likely planning for inpatient rehab if she is a candidate.  Despite aggressive steroid dosing she continues to have periods of lethargy, confusion and hemiparesis and she is not able to stand, toilet or feed herself at this time and nor oncology evaluated and given that her functional status remains poor they are recommending a palliative care consult to help further with goals of care and disposition at this time but if her clinical status improves they can consider proceeding with staging and treatment planning.  Assessment and plan Principal  Problem:   Brain tumor [facility]) Active Problems:   Hypertension   Dyslipidemia   Hypokalemia   Multifocal Brain tumors with associated vasogenic edema and left sided weakness -Neurosurgery on board.   -Patient had presented with generalized weakness, fatigue, difficulty balancing herself as well as falls with left upper extremity deficit. -MRI shows multifocal somewhat infiltrative and hypercellular enhancing brain masses involving both cerebral hemispheres and brainstem and citrate cerebellum.  Possibility of CNS lymphoma.  -Extensive brain and brainstem edema with midline shift of 7 mm was reported.   -Continuing Dexamethasone IV 6 mg every 6 and levetiracetam 1000 mg IV every 12 as well as carbamazepine 200 mg p.o. twice daily.   -CT  brain lab protocol was then performed which showed multiple brightly enhancing mass lesions throughout the infratentorial and supratentorial brain most consistent with CNS lymphoma with vasogenic edema.   -Patient subsequently underwent stereotactic brain biopsy per neurosurgery, Dr. Marcello Moores, 05/16/2022. 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 -Repeat head CT scan without contrast on 05/22/2022 showed "Marked motion degradation. No additional complication evident following biopsy of the inferior basal ganglia lesion on the right. Maximal measurement today is 2.6 cm. The study is markedly  regarded by motion and assessment for subtle change is not  possible. Regional edema on the right causes mass effect with right-to-left shift estimated at 7 mm, similar to the prior exam.  No visible edema in the inferior cerebellum on the right or in the left temporal lobe." -CT scan of the chest abdomen and pelvis with no findings of lymphadenopathy to suggest lymphoma. We will follow neurosurgery recommendations. -Continue  seizure precautions -Neurooncology evaluated and unfortunately her status remains poor despite high-dose corticosteroids and  she is not independent with ADLs at this time.  Neurooncology recommends palliative care consultation and continuing steroids and supportive care but if her clinical status improves then they can consider proceeding with staging and treatment but overall the prognosis has been discussed with the patient's daughter by the neuro oncologist. -Follow Neurosurgery and Neuro-Oncology Recc's -Palliative care has been consulted for further goals of care discussion given that her prognosis is extremely poor and that she has not made very much progress at all   Hyperlipidemia. -Continue with Simvastatin 20 mg p.o. daily  Mild Hypokalemia. -Improved.  Patient's potassium is now 3.8 -Continue monitoring for the necessary -Mag level is now 1.9 -Continue to monitor and trend and repeat CMP in a.m.  Essential Hypertension. -Continue to hold antihypertensives for now.   -Continue to monitor blood pressure protocol -Patient is on HCTZ and amlodipine at home. -Last blood pressure reading was 112/53  History of Trigeminal Neuralgia.   -Patient takes carbamazepine at home and this has been resumed  Leukocytosis -In the setting of steroid demargination given that she is on dexamethasone -WBC went from 7.4 is now 11.3 yesterday and today is 12.4 Continue to monitor for signs and symptoms of infection; no overt infection noted -Repeat CBC in a.m.  Hyperglycemia -In the setting of steroid demargination -Continue monitor and trend CBGs and they have been ranging from 112-132 daily on BMP/CMP and CBGs ranging from 118-130 on fingersticks -If necessary will place on sensitive NovoLog cycle insulin   DVT prophylaxis: Place and maintain sequential compression device Start: 05/17/22 0749 SCDs Start: 05/16/22 1718 SCDs Start: 05/13/22 1051    Code Status: Full Code Family Communication: No family currently at bedside and was updated by the neuro oncologist Dr. Mickeal Skinner  Disposition Plan:  Level of care:  Progressive Status is: Inpatient Remains inpatient appropriate because: Needs further neurosurgical and neuro oncology clearance and if improved clinically.  CIR evaluating for potential candidacy   Consultants:  Neurosurgery Neuro-oncology  Procedures:  CT head 05/13/2022, 05/17/2022 CT chest abdomen and pelvis 05/15/2022 MRI brain 05/13/2022 CT BrainLab head 05/14/2022 Stereotactic brain biopsy application of cranial navigation procedure note per Dr. Marcello Moores neurosurgery 05/16/2022.  Antimicrobials:  Anti-infectives (From admission, onward)    Start     Dose/Rate Route Frequency Ordered Stop   05/16/22 2000  ceFAZolin (ANCEF) IVPB 1 g/50 mL premix        1 g 100 mL/hr over 30 Minutes Intravenous Every 8 hours 05/16/22 1717 05/17/22 1445   05/16/22 0745  ceFAZolin (ANCEF) IVPB 2g/100 mL premix        2 g 200 mL/hr over 30 Minutes Intravenous On call to O.R. 05/16/22 9622 05/16/22 1210       Subjective: Seen and examined at bedside and she was lethargic and confused still and very somnolent.  Has not been really interacting and not doing her ADLs.  No oncology evaluated and recommending palliative care for further goals of care discussion but if she improves and they can discuss staging and further work-up with treatment plan.  No other concerns or complaints at this time.  No family at bedside  Objective: Vitals:   05/24/22 0305 05/24/22 0705 05/24/22 1110 05/24/22 1509  BP: 113/67 (!) 165/72 134/68 (!) 112/53  Pulse: (!) 48 63 83 60  Resp: '16 15 14 15  '$ Temp: 98.7 F (37.1 C) 97.8 F (36.6 C) 97.6 F (36.4 C) 98.2  F (36.8 C)  TempSrc: Oral Oral Oral Oral  SpO2: 98% 94% 99% 99%  Weight: 62.7 kg     Height:        Intake/Output Summary (Last 24 hours) at 05/24/2022 1612 Last data filed at 05/24/2022 7253 Gross per 24 hour  Intake 480 ml  Output 2400 ml  Net -1920 ml   Filed Weights   05/22/22 0335 05/23/22 0355 05/24/22 0305  Weight: 62 kg 62.5 kg 62.7 kg    Examination: Physical Exam:  Constitutional: WN/WD African-American female currently was somnolent and drowsy Respiratory: Diminished to auscultation bilaterally with coarse breath sounds, no wheezing, rales, rhonchi or crackles. Normal respiratory effort and patient is not tachypenic. No accessory muscle use.  Unlabored breathing Cardiovascular: RRR, no murmurs / rubs / gallops. S1 and S2 auscultated. No extremity edema.  Abdomen: Soft, non-tender, distended secondary body habitus. Bowel sounds positive.  GU: Deferred. Musculoskeletal: No clubbing / cyanosis of digits/nails. No joint deformity upper and lower extremities.  Skin: No rashes, lesions, ulcers on limited skin evaluation. No induration; Warm and dry.  Neurologic: She is extremely somnolent and drowsy and does not really arouse to follow commands Psychiatric: Impaired judgment and insight  Data Reviewed: I have personally reviewed following labs and imaging studies  CBC: Recent Labs  Lab 05/18/22 0339 05/19/22 0216 05/20/22 0332 05/21/22 0329 05/22/22 0807 05/23/22 0056 05/24/22 0328  WBC 12.8* 12.4* 11.1* 10.0 7.4 11.3* 12.4*  NEUTROABS 11.5* 11.1* 9.5*  --  5.7  --  11.0*  HGB 12.4 12.2 13.4 13.8 12.6 13.1 12.1  HCT 37.4 37.4 40.8 42.1 38.2 38.7 35.5*  MCV 80.3 80.4 80.6 80.3 79.6* 79.5* 78.4*  PLT 217 223 247 228 195 209 664   Basic Metabolic Panel: Recent Labs  Lab 05/18/22 0339 05/19/22 0216 05/20/22 0332 05/21/22 0329 05/22/22 0807 05/23/22 0056 05/24/22 0328  NA 142   < > 138 139 137 138 137  K 4.2   < > 3.6 3.6 3.9 3.9 3.8  CL 106   < > 102 102 104 105 104  CO2 25   < > '25 24 24 23 24  '$ GLUCOSE 144*   < > 124* 116* 112* 114* 132*  BUN 13   < > '12 14 18 15 12  '$ CREATININE 0.88   < > 0.84 0.69 0.77 0.90 0.67  CALCIUM 9.7   < > 9.6 9.5 8.8* 8.8* 9.0  MG 2.0  --   --   --   --  2.1 1.9  PHOS  --   --   --   --   --  2.7 2.9   < > = values in this interval not displayed.   GFR: Estimated  Creatinine Clearance: 58 mL/min (by C-G formula based on SCr of 0.67 mg/dL). Liver Function Tests: Recent Labs  Lab 05/23/22 0056 05/24/22 0328  AST  --  27  ALT  --  43  ALKPHOS  --  42  BILITOT  --  0.7  PROT  --  5.5*  ALBUMIN 3.2* 3.0*   No results for input(s): "LIPASE", "AMYLASE" in the last 168 hours. No results for input(s): "AMMONIA" in the last 168 hours. Coagulation Profile: No results for input(s): "INR", "PROTIME" in the last 168 hours. Cardiac Enzymes: No results for input(s): "CKTOTAL", "CKMB", "CKMBINDEX", "TROPONINI" in the last 168 hours. BNP (last 3 results) No results for input(s): "PROBNP" in the last 8760 hours. HbA1C: No results for input(s): "HGBA1C" in the last 72  hours. CBG: Recent Labs  Lab 05/21/22 0722 05/21/22 1502 05/22/22 0728 05/23/22 0747 05/24/22 0742  GLUCAP 119* 118* 118* 123* 136*   Lipid Profile: No results for input(s): "CHOL", "HDL", "LDLCALC", "TRIG", "CHOLHDL", "LDLDIRECT" in the last 72 hours. Thyroid Function Tests: No results for input(s): "TSH", "T4TOTAL", "FREET4", "T3FREE", "THYROIDAB" in the last 72 hours. Anemia Panel: No results for input(s): "VITAMINB12", "FOLATE", "FERRITIN", "TIBC", "IRON", "RETICCTPCT" in the last 72 hours. Sepsis Labs: No results for input(s): "PROCALCITON", "LATICACIDVEN" in the last 168 hours.  No results found for this or any previous visit (from the past 240 hour(s)).   Radiology Studies: No results found.  Scheduled Meds:  amLODipine  2.5 mg Oral Daily   carbamazepine  200 mg Oral BID   Chlorhexidine Gluconate Cloth  6 each Topical Daily   dexamethasone (DECADRON) injection  6 mg Intravenous Q6H   levETIRAcetam  500 mg Oral BID   pantoprazole  40 mg Oral Daily   simvastatin  20 mg Oral Daily   Continuous Infusions:   LOS: 11 days   Raiford Noble, DO [facility] Available via Epic secure chat 7am-7pm After these hours, please refer to coverage provider listed on  [email or web address] 05/24/2022, 4:12 PM

## 2022-05-24 NOTE — Consult Note (Signed)
[facility] Consult Note  Patient Care Team: Willene Hatchet, NP as PCP - General (Nurse Practitioner)  CHIEF COMPLAINTS/PURPOSE OF CONSULTATION:  Primary CNS Lymphoma  HISTORY OF PRESENTING ILLNESS:  April Saunders 66 y.o. female presented with several weeks of progressive confusion and left sided weakness.  CNS imaging demonstrated multifocal enhancing lesions, mainly within right hemisphere.  She underwent stereotactic biopsy with Dr. Marcello Moores on 05/16/22; path demonstrated CNS Lymphoma.  Since surgery she has remained with periods of lethargy, confusion, hemiparesis despite aggressive steroid dosing.  She is not standing, toileting, or feeding herself at this time.  MEDICAL HISTORY:  Past Medical History:  Diagnosis Date   Abnormal MRI    brain   DJD (degenerative joint disease)    multiple joints   Hyperlipidemia    Hypertension    Lipoma    rt ankle   Obesity    Trigeminal neuralgia 07/05/2021    SURGICAL HISTORY: Past Surgical History:  Procedure Laterality Date   ABDOMINAL HYSTERECTOMY     ? ovaries   APPLICATION OF CRANIAL NAVIGATION Right 05/16/2022   Procedure: APPLICATION OF CRANIAL NAVIGATION;  Surgeon: Vallarie Mare, MD;  Location: [facility];  Service: Neurosurgery;  Laterality: Right;   BRAIN BIOPSY Right 05/16/2022   Procedure: STEREOTACTIC BRAIN BIOPSY;  Surgeon: Vallarie Mare, MD;  Location: [facility];  Service: Neurosurgery;  Laterality: Right;   COLONOSCOPY      SOCIAL HISTORY: Social History   Socioeconomic History   Marital status: Married    Spouse name: Quincy Simmonds   Number of children: 3   Years of education: 11th grade   Highest education level: Not on file  Occupational History   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 Three adult child live independently and nearby.   Social Determinants of Health   Financial Resource Strain: Not on file  Food Insecurity: Not on file  Transportation Needs: Not on file  Physical Activity: Not on file  Stress: Not on file  Social Connections: Not on file  Intimate Partner Violence: Not on file    FAMILY HISTORY: Family History  Problem Relation Age of Onset   Diabetes Mellitus I Mother    Hypertension Brother    Diabetes Mellitus I Brother    Esophageal cancer Brother    Colon cancer Neg Hx    Stomach cancer Neg Hx    Rectal cancer Neg Hx     ALLERGIES:  is allergic to atorvastatin calcium, chicken allergy, lipitor [atorvastatin], lisinopril, and pork-derived products.  MEDICATIONS:  Current Facility-Administered Medications  Medication Dose Route Frequency Provider Last Rate Last Admin   acetaminophen (TYLENOL) tablet 650 mg  650 mg Oral Q4H PRN Vallarie Mare, MD   650 mg at 05/24/22 1314   Or   acetaminophen (TYLENOL) suppository 650 mg  650 mg Rectal Q4H PRN Vallarie Mare, MD  acetaminophen (TYLENOL) tablet 500 mg  500 mg Oral Q6H PRN Myles Rosenthal A, MD   500 mg at 05/21/22 0911   albuterol (PROVENTIL) (2.5 MG/3ML) 0.083% nebulizer solution 2.5 mg  2.5 mg Nebulization Q2H PRN Clance Boll, MD       amLODipine (NORVASC) tablet 2.5 mg  2.5 mg Oral Daily Eugenie Filler, MD   2.5 mg at 05/24/22 1100   carbamazepine (TEGRETOL) tablet 200 mg  200 mg Oral BID Vallarie Mare, MD   200 mg at 05/24/22 1100   Chlorhexidine Gluconate Cloth 2 % PADS 6 each  6 each Topical Daily Vallarie Mare, MD   6 each at 05/22/22 1117   dexamethasone (DECADRON) injection 6  mg  6 mg Intravenous Q6H Eugenie Filler, MD   6 mg at 05/24/22 1105   HYDROcodone-acetaminophen (NORCO/VICODIN) 5-325 MG per tablet 1 tablet  1 tablet Oral Q4H PRN Vallarie Mare, MD   1 tablet at 05/23/22 1929   ketorolac (TORADOL) 30 MG/ML injection 15 mg  15 mg Intravenous Q6H PRN Eugenie Filler, MD   15 mg at 05/23/22 2355   levETIRAcetam (KEPPRA) tablet 500 mg  500 mg Oral BID Sheikh, Omair Schaller, DO   500 mg at 05/24/22 1100   LORazepam (ATIVAN) injection 2 mg  2 mg Intravenous Q5 min PRN Vallarie Mare, MD   2 mg at 05/21/22 2358   ondansetron (ZOFRAN) tablet 4 mg  4 mg Oral Q6H PRN Clance Boll, MD       Or   ondansetron [facility]) injection 4 mg  4 mg Intravenous Q6H PRN Clance Boll, MD       ondansetron [facility]) tablet 4 mg  4 mg Oral Q4H PRN Vallarie Mare, MD       Or   ondansetron [facility]) injection 4 mg  4 mg Intravenous Q4H PRN Vallarie Mare, MD       pantoprazole (PROTONIX) EC tablet 40 mg  40 mg Oral Daily Raiford Noble Huckabay, DO   40 mg at 05/24/22 1100   promethazine (PHENERGAN) tablet 12.5-25 mg  12.5-25 mg Oral Q4H PRN Vallarie Mare, MD       simvastatin (ZOCOR) tablet 20 mg  20 mg Oral Daily Vallarie Mare, MD   20 mg at 05/24/22 1100    REVIEW OF SYSTEMS:   Limited by AMS   PHYSICAL EXAMINATION: Vitals:   05/24/22 0705 05/24/22 1110  BP: (!) 165/72 134/68  Pulse: 63 83  Resp: 15 14  Temp: 97.8 F (36.6 C) 97.6 F (36.4 C)  SpO2: 94% 99%   KPS: 60. General: Drowsy, in restraings Head: normal EENT: No conjunctival injection or scleral icterus. Oral mucosa moist Lungs: Resp effort normal Cardiac: Regular rate and rhythm Abdomen: Soft, non-distended abdomen Skin: No rashes cyanosis or petechiae. Extremities: No clubbing or edema  NEUROLOGIC EXAM: Mental Status: Drowsy, doesn't maintain alertness with stimulation. Oriented to self and environment. Language is responsive to some simple questions. Cranial  Nerves: Visual acuity is grossly normal. Visual fields are full. Extra-ocular movements intact. No ptosis. Face is symmetric, tongue midline. Motor: Left arm withdraws minimally, though has some antigravity tone when stimulated.  Leg withdraws against gravity R>L. Sensory: Intact to light touch and temperature Gait: Non ambulatory   LABORATORY DATA:  I have reviewed the data as listed Lab Results  Component Value Date   WBC 12.4 (H) 05/24/2022   HGB 12.1 05/24/2022   HCT  35.5 (L) 05/24/2022   MCV 78.4 (L) 05/24/2022   PLT 212 05/24/2022   Recent Labs    05/13/22 0017 05/15/22 0214 05/22/22 0807 05/23/22 0056 05/24/22 0328  NA 141   < > 137 138 137  K 4.0   < > 3.9 3.9 3.8  CL 106   < > 104 105 104  CO2 22   < > '24 23 24  '$ GLUCOSE 126*   < > 112* 114* 132*  BUN 14   < > '18 15 12  '$ CREATININE 0.88   < > 0.77 0.90 0.67  CALCIUM 10.6*   < > 8.8* 8.8* 9.0  GFRNONAA >60   < > >60 >60 >60  PROT 7.9  --   --   --  5.5*  ALBUMIN 4.5  --   --  3.2* 3.0*  AST 27  --   --   --  27  ALT 22  --   --   --  43  ALKPHOS 51  --   --   --  42  BILITOT 1.7*  --   --   --  0.7   < > = values in this interval not displayed.    RADIOGRAPHIC STUDIES: I have personally reviewed the radiological images as listed and agreed with the findings in the report. CT HEAD WO CONTRAST (5MM)  Result Date: 05/22/2022 CLINICAL DATA:  Recent diagnosis of B-cell lymphoma. Prior biopsy. Mental status changes. EXAM: CT HEAD WITHOUT CONTRAST TECHNIQUE: Contiguous axial images were obtained from the base of the skull through the vertex without intravenous contrast. RADIATION DOSE REDUCTION: This exam was performed according to the departmental dose-optimization program which includes automated exposure control, adjustment of the mA and/or kV according to patient size and/or use of iterative reconstruction technique. COMPARISON:  05/17/2022 FINDINGS: Brain: Today's study suffers from considerable motion degradation.  There is resolution of the previously seen edema in the right cerebellum. Mass lesion at the inferior basal ganglia on the right with intrinsic hyperdensity measures 2.6 cm in diameter. No evidence of hemorrhagic complication post biopsy. Regional edema persists relating to this lesion. Previously see lesion of the left temporal lobe is not discernible. Right-to-left shift is approximately 7 mm. No evidence of obstructive hydrocephalus. Vascular: No vascular finding. Skull: Negative otherwise. Sinuses/Orbits: Clear/normal Other: None IMPRESSION: Marked motion degradation. No additional complication evident following biopsy of the inferior basal ganglia lesion on the right. Maximal measurement today is 2.6 cm. The study is markedly degraded by motion and assessment for subtle change is not possible. Regional edema on the right causes mass effect with right-to-left shift estimated at 7 mm, similar to the prior exam. No visible edema in the inferior cerebellum on the right or in the left temporal lobe. Electronically Signed   By: Nelson Chimes M.D.   On: 05/22/2022 12:59   CT HEAD WO CONTRAST  Result Date: 05/17/2022 CLINICAL DATA:  CNS neoplasm follow-up EXAM: CT HEAD WITHOUT CONTRAST TECHNIQUE: Contiguous axial images were obtained from the base of the skull through the vertex without intravenous contrast. RADIATION DOSE REDUCTION: This exam was performed according to the departmental dose-optimization program which includes automated exposure control, adjustment of the mA and/or kV according to patient size and/or use of iterative reconstruction technique. COMPARISON:  Head CT from 3 days ago FINDINGS: Brain: 2.3 cm high-density mass centered at the right basal ganglia with expected superimposed blood products and gas after biopsy. Continued profound regional edema with midline shift measuring 7  mm, increased. There may be developing entrapment of the left lateral ventricle, but no measurable change from prior CT.  No cortical infarct or extra-axial collection noted. Underestimated parenchymal masses when compared to prior postcontrast imaging. Vascular: No hyperdense vessel or unexpected calcification. Skull: Normal. Negative for fracture or focal lesion. Sinuses/Orbits: No acute finding. IMPRESSION: Expected blood products and gas around the right basal ganglia mass after biopsy. Continued pronounced vasogenic edema, midline shift has increased to 7 mm. Electronically Signed   By: Jorje Guild M.D.   On: 05/17/2022 05:34   CT CHEST ABDOMEN PELVIS W CONTRAST  Result Date: 05/15/2022 CLINICAL DATA:  Brain neoplasm. Question lymphoma. Staging. * Tracking Code: BO * EXAM: CT CHEST, ABDOMEN, AND PELVIS WITH CONTRAST TECHNIQUE: Multidetector CT imaging of the chest, abdomen and pelvis was performed following the standard protocol during bolus administration of intravenous contrast. RADIATION DOSE REDUCTION: This exam was performed according to the departmental dose-optimization program which includes automated exposure control, adjustment of the mA and/or kV according to patient size and/or use of iterative reconstruction technique. CONTRAST:  15m OMNIPAQUE IOHEXOL 300 MG/ML  SOLN COMPARISON:  None Available. FINDINGS: CT CHEST FINDINGS Cardiovascular: The heart size is normal. No substantial pericardial effusion. Mediastinum/Nodes: No mediastinal lymphadenopathy. There is no hilar lymphadenopathy. The esophagus has normal imaging features. There is no axillary lymphadenopathy. Lungs/Pleura: 2 mm right lower lobe pulmonary nodule identified on 59/4. No suspicious pulmonary nodule or mass. No focal airspace consolidation. No pleural effusion. Dependent atelectasis noted in both lower lobes. Musculoskeletal: No worrisome lytic or sclerotic osseous abnormality. CT ABDOMEN PELVIS FINDINGS Hepatobiliary: 8 mm hypodensity in the left liver is too small to characterize but likely benign. There is no evidence for gallstones,  gallbladder wall thickening, or pericholecystic fluid. No intrahepatic or extrahepatic biliary dilation. Pancreas: No focal mass lesion. No dilatation of the main duct. No intraparenchymal cyst. No peripancreatic edema. Spleen: No splenomegaly. No focal mass lesion. Adrenals/Urinary Tract: No adrenal nodule or mass. Kidneys unremarkable. No evidence for hydroureter. The urinary bladder appears normal for the degree of distention. Dependent contrast in the bladder lumen compatible with infused head CT yesterday. Stomach/Bowel: Stomach is distended with fluid and gas. Duodenum is normally positioned as is the ligament of Treitz. No small bowel wall thickening. No small bowel dilatation. The terminal ileum is normal. The appendix is normal. No gross colonic mass. No colonic wall thickening. Vascular/Lymphatic: No abdominal aortic aneurysm. No abdominal aortic atherosclerotic calcification. There is no gastrohepatic or hepatoduodenal ligament lymphadenopathy. No retroperitoneal or mesenteric lymphadenopathy. No pelvic sidewall lymphadenopathy. Reproductive: Unremarkable. Other: No intraperitoneal free fluid. Musculoskeletal: No worrisome lytic or sclerotic osseous abnormality. IMPRESSION: 1. No lymphadenopathy or other findings to suggest lymphoma in the chest, abdomen, or pelvis. 2. 2 mm right lower lobe pulmonary nodule. No follow-up needed if patient is low-risk.This recommendation follows the consensus statement: Guidelines for Management of Incidental Pulmonary Nodules Detected on CT Images: From the Fleischner Society 2017; Radiology 2017; 284:228-243. 3. 8 mm hypodensity in the left liver is too small to characterize but likely benign. Electronically Signed   By: EMisty StanleyM.D.   On: 05/15/2022 12:42   CT BRAINLAB HEAD WO/W CONTRAST (1MM)  Result Date: 05/14/2022 CLINICAL DATA:  Brain tumor.  Stealth protocol. EXAM: CT HEAD WITHOUT AND WITH CONTRAST TECHNIQUE: Contiguous axial images were obtained from the  base of the skull through the vertex without and with intravenous contrast. RADIATION DOSE REDUCTION: This exam was performed according to the departmental dose-optimization program which  includes automated exposure control, adjustment of the mA and/or kV according to patient size and/or use of iterative reconstruction technique. CONTRAST:  18m OMNIPAQUE IOHEXOL 300 MG/ML  SOLN COMPARISON:  CT and MRI yesterday. FINDINGS: Brain: Enhancing 11 mm lesion within the right cerebellum with surrounding edema. Enhancing 2.3 cm in diameter mass with the epicenter at the inferior right basal ganglia with extensive surrounding edema. Indistinct enhancing mass of the posterior mesial temporal lobe on the left, unable to accurately measure by CT. Mild associated edema. Subtle foci of enhancement of the dorsal mid brain and right corona radiata, better shown by MRI. Edema associated with the dominant right basal ganglia lesion is responsible for mass effect and right to left midline shift of 3 mm. No ventricular trapping. No extra-axial fluid collection. Vascular: No primary vascular lesion. Skull: Normal Sinuses/Orbits: Clear/normal Other: None IMPRESSION: Multiple brightly enhancing mass lesions throughout the infratentorial and supratentorial brain as outlined above, most consistent with CNS lymphoma. Lesions are associated with vasogenic edema. The dominant lesion in the right basal ganglia, maximal dimension 2.3 cm by CT, results in mass effect with right-to-left shift of 3 mm. Electronically Signed   By: MNelson ChimesM.D.   On: 05/14/2022 15:53   EEG adult  Result Date: 05/13/2022 YLora Havens MD     05/13/2022 10:25 PM Patient Name: April RumlerMRN: [id number] Attending: PLora HavensReferring Physician/Provider: TClance Boll MD Date: 05/13/2022 Duration: 22.11 mins Patient history: 89yoF with multifocal brain tumors with associated vasogenic edema and left sided weakness. EEG to evaluate for  seizure Level of alertness: Awake AEDs during EEG study: LEV Technical aspects: This EEG study was done with scalp electrodes positioned according to the 10-20 International system of electrode placement. Electrical activity was acquired at a sampling rate of '500Hz'$  and reviewed with a high frequency filter of '70Hz'$  and a low frequency filter of '1Hz'$ . EEG data were recorded continuously and digitally stored. Description: The posterior dominant rhythm consists of 8-9 Hz activity of moderate voltage (25-35 uV) seen predominantly in posterior head regions, symmetric and reactive to eye opening and eye closing. EEG showed continuous generalized and lateralized right hemisphere polymorphic sharply contoured 3 to 6 Hz theta-delta slowing admixed with 15 to 18 Hz, 2-3 uV beta activity in left hemisphere. Hyperventilation and photic stimulation were not performed.   ABNORMALITY - Continuous slow, generalized and lateralized right hemisphere IMPRESSION: This study is suggestive of cortical dysfunction arising from right hemisphere likely secondary to underlying structural abnormality. Additionally there is mild to moderate diffuse encephalopathy, nonspecific etiology. No seizures or epileptiform discharges were seen throughout the recording. PLora Saunders  MR Brain W and Wo Contrast  Result Date: 05/13/2022 CLINICAL DATA:  66year old female with altered mental status and evidence of 2.5 cm right hemisphere mass with vasogenic edema on noncontrast head CT. EXAM: MRI HEAD WITHOUT AND WITH CONTRAST TECHNIQUE: Multiplanar, multiecho pulse sequences of the brain and surrounding structures were obtained without and with intravenous contrast. CONTRAST:  611mGADAVIST GADOBUTROL 1 MMOL/ML IV SOLN COMPARISON:  Noncontrast head CT 0408 hours today. FINDINGS: Study is intermittently degraded by motion artifact despite repeated imaging attempts. Brain: In the right hemisphere centered at the basal ganglia an oval T2 dark mass with  conspicuous decreased diffusion (series 6, image 21) is homogeneously enhancing and approximately 2.6 cm long axis. Confluent surrounding T2 and FLAIR hyperintensity in a vasogenic edema pattern. But there is also evidence of nearby abnormal petechial enhancement in  the right corona radiata border in the corpus callosum (series 17, image 15). Diffusion might be abnormal in the body of the corpus callosum nearby (series 5, image 79). And in the left temporal lobe there is 17 mm area of rounded masslike enhancement which also has abnormal diffusion and abuts the ventricle there (series 16, image 17). Furthermore, there is evidence of abnormal enhancement in the dorsal brainstem on series 16, image 12 with confluent asymmetric brainstem edema (series 10, images 12 and 14). And lastly, there is an 11 mm enhancing oval mass in the central right cerebellum with restricted diffusion and regional cerebellar edema. No areas of dural thickening identified. Intracranial mass effect with leftward midline shift of 7 mm at the septum pellucidum. Effaced right lateral ventricle. No ventriculomegaly, intraventricular debris, definite transependymal edema, or ependymal enhancement. No acute intracranial hemorrhage identified. No restricted diffusion suggestive of acute infarction. Pituitary poorly visualized. Cervicomedullary junction appears to remain normal. Vascular: Major intracranial vascular flow voids are grossly preserved. Skull and upper cervical spine: Not well evaluated due to motion. Sinuses/Orbits: Negative. Other: Visible scalp and face appear negative. IMPRESSION: 1. Multifocal, but also somewhat infiltrative and hypercellular enhancing brain masses involving both cerebral hemispheres, the brainstem, and the right cerebellum. Favor CNS Lymphoma. Multicentric high-grade Glioma and metastatic disease are possible but felt less likely. 2. Extensive associated brain and brainstem edema. Intracranial mass effect with  leftward midline shift of 7 mm. 3. Recommend Neuro-Oncology and Neurosurgery consultation. Electronically Signed   By: Genevie Ann M.D.   On: 05/13/2022 09:20   CT HEAD WO CONTRAST (5MM)  Addendum Date: 05/13/2022   ADDENDUM REPORT: 05/13/2022 05:39 ADDENDUM: Study discussed by telephone with Dr. Joseph Berkshire on 05/13/2022 at 0527 hours. Electronically Signed   By: Genevie Ann M.D.   On: 05/13/2022 05:39   Result Date: 05/13/2022 CLINICAL DATA:  66 year old female turned mental status. EXAM: CT HEAD WITHOUT CONTRAST TECHNIQUE: Contiguous axial images were obtained from the base of the skull through the vertex without intravenous contrast. RADIATION DOSE REDUCTION: This exam was performed according to the departmental dose-optimization program which includes automated exposure control, adjustment of the mA and/or kV according to patient size and/or use of iterative reconstruction technique. COMPARISON:  Brain MRI 02/08/2010. FINDINGS: Brain: Round and fairly circumscribed slightly hyperdense lesion centered at the right deep gray nuclei is nearly 2.5 cm diameter on series 2, image 17. That area was unremarkable on the 2011 CT. And there is a large volume of surrounding white matter hypodensity in the right hemisphere which most resembles vasogenic edema. Regional mass effect including effaced right lateral ventricle. Leftward midline shift of 7 mm. Edema appears to track into the right midbrain, brainstem on series 5, image 42. Effaced suprasellar cistern. Other basilar cisterns are patent. Questionably also asymmetric edema in the deep right cerebellum series 2 image 10. But no 2nd mass lesion is evident. No convincing acute intracranial hemorrhage or cortically based infarct. The left occipital horn is mildly dilated, and brainstem edema does appear to affect the level of the cerebral aqueduct. Mild if any transependymal edema at this time. Vascular: No suspicious intracranial vascular hyperdensity. Skull: No  acute or suspicious osseous lesion identified. Sinuses/Orbits: Visualized paranasal sinuses and mastoids are clear. Other: Visualized orbits and scalp soft tissues are within normal limits. IMPRESSION: 1. 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 2.5 cm hypercellular appearing mass centered at the right deep gray nuclei. Questionable 2nd lesion in  the right cerebellum. Recommend MRI Head without and with contrast to further characterize. 2. 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 Electronically Signed: By: Genevie Ann M.D. On: 05/13/2022 05:10   DG Knee 3 Views Left  Result Date: 05/08/2022 CLINICAL DATA:  Primary osteoarthritis of both knees EXAM: LEFT KNEE - 3 VIEW; RIGHT KNEE - 3 VIEW COMPARISON:  None Available. FINDINGS: Right knee: No acute fracture. Mild tricompartmental degenerative changes appear most advanced in the medial joint compartment. Patellar enthesophyte of the quadriceps insertion. No knee joint effusion. Soft tissues are unremarkable. Left knee: No acute fracture. Moderate tricompartmental degenerative changes more advanced in the medial and patellofemoral joint compartments. Bulky patellar enthesophytes. No knee joint effusion. Soft tissues are unremarkable. IMPRESSION: Bilateral knees: Left-greater-than-right tricompartmental degenerative changes of the knees as described. Electronically Signed   By: Albin Felling M.D.   On: 05/08/2022 15:11   DG Knee 3 Views Right  Result Date: 05/08/2022 CLINICAL DATA:  Primary osteoarthritis of both knees EXAM: LEFT KNEE - 3 VIEW; RIGHT KNEE - 3 VIEW COMPARISON:  None Available. FINDINGS: Right knee: No acute fracture. Mild tricompartmental degenerative changes appear most advanced in the medial joint compartment. Patellar enthesophyte of the quadriceps  insertion. No knee joint effusion. Soft tissues are unremarkable. Left knee: No acute fracture. Moderate tricompartmental degenerative changes more advanced in the medial and patellofemoral joint compartments. Bulky patellar enthesophytes. No knee joint effusion. Soft tissues are unremarkable. IMPRESSION: Bilateral knees: Left-greater-than-right tricompartmental degenerative changes of the knees as described. Electronically Signed   By: Albin Felling M.D.   On: 05/08/2022 15:11   DG Chest 2 View  Result Date: 05/08/2022 CLINICAL DATA:  66 year old female with chest pain EXAM: CHEST - 2 VIEW COMPARISON:  None Available. FINDINGS: Cardiomediastinal silhouette within normal limits in size and contour. No evidence of central vascular congestion. No interlobular septal thickening. No pneumothorax or pleural effusion. Coarsened interstitial markings, with no confluent airspace disease. No acute displaced fracture. Degenerative changes of the spine. IMPRESSION: No active cardiopulmonary disease. Electronically Signed   By: Corrie Mckusick D.O.   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  Unfortunately her functional status remains poor despite high dose corticosteroids.  She is not independent at this time with any activities of daily living.  We reviewed pathology and prognosis with her daughter over the phone.  She understands that even with treatment, average survival in patients over 50 with impaired functional status is ~1 year.  This is per [facility] RPA class prognosis tool from 2006.  At this time our recommendation is to continue steroids, give best supportive care.  If her clinical status improves we can consider proceeding with staging and treatment planning.   In meantime, palliative care consult would be appropriate to help further with goals of care  and disposition.  All questions were answered. The patient knows to call the clinic with any problems, questions or concerns.  The total time spent in the encounter was  60 minutes  and more than 50% was on counseling and review of test results     Ventura Sellers, MD 05/24/2022 3:18 PM

## 2022-05-24 NOTE — Progress Notes (Signed)
Inpatient Rehabilitation Admissions Coordinator   I await palliative consult before proceeding with plans for a potential CIR admit. [address] pending in the meantime, but GOC discussion with family is needed before proceeding further given oncology consult recommendations.  Danne Baxter, RN, MSN Rehab Admissions Coordinator [phone] 05/24/2022 9:50 PM

## 2022-05-24 NOTE — Progress Notes (Signed)
Occupational Therapy Treatment Patient Details Name: April Saunders MRN: [id number] DOB: [birth year] Today's Date: 05/24/2022   History of present illness The pt is a 66 yo female presenting 7/2 with increased weakness and falls in last 3 weeks. CT revealed a 2.5 cm hypercellular mass with extensive vasogenic edema centered at the right deep gray nuclei with possible 2nd lesion in the right cerebellum. Pt now s/p stereotactic brain biopsy on 7/5. PMH includes: DJD, HLD, HTN, and trigenimal neuralgia.   OT comments  Pt progressing well.  Pt following simple commands; very concerned when turning whether to go L or R and wanting to try to get it right. Pt asked to find clock to see what time it is and pt able to find clock, but stating incorrect time. Pt more alert today and more consistently following 1 step commands ~75% accuracy. Pt MaxA +1/ modA +2 for stand pivot transfers using RUE for hand held assist and LUE supported at elbow with heavy assist for LLE to weight shift to take steps. Pt's OT session focused on following simple commands, transfers and ADL functional mobility as pt was more alert. Pt attempted to use BSC, but was unable to relieve self. Pt making great improvements with OOB ADL and functional tasks. OT following acutely.   Recommendations for follow up therapy are one component of a multi-disciplinary discharge planning process, led by the attending physician.  Recommendations may be updated based on patient status, additional functional criteria and insurance authorization.    Follow Up Recommendations  Acute inpatient rehab (3hours/day)    Assistance Recommended at Discharge Frequent or constant Supervision/Assistance  Patient can return home with the following  Two people to help with walking and/or transfers;A lot of help with bathing/dressing/bathroom;Direct supervision/assist for medications management;Direct supervision/assist for financial management;Assist for  transportation;Assistance with cooking/housework;Help with stairs or ramp for entrance;Assistance with feeding   Equipment Recommendations  Wheelchair (measurements OT);Wheelchair cushion (measurements OT);Tub/shower bench;Hospital bed    Recommendations for Other Services      Precautions / Restrictions Precautions Precautions: Fall Precaution Comments: incontinent of urine Restrictions Weight Bearing Restrictions: No       Mobility Bed Mobility Overal bed mobility: Needs Assistance Bed Mobility: Supine to Sit, Sit to Supine     Supine to sit: Mod assist Sit to supine: Mod assist   General bed mobility comments: modA to initiate and complete trunk elevation but pt able to assist with LE and pull on therapist for trunk elevation, pt assisting to elevate LE to return to bed. also looked to L with max cues to seek out pillow to lay down trunk    Transfers Overall transfer level: Needs assistance Equipment used: 1 person hand held assist Transfers: Sit to/from Stand, Bed to chair/wheelchair/BSC Sit to Stand: Mod assist Stand pivot transfers: Max assist         General transfer comment: modA to manage initial stand with pt holding PT arms in front-facing transfer. maxA to pivot to [facility] with +2 to manage briefs for pt to use commode. modA to stand from [place] Overall balance assessment: Needs assistance Sitting-balance support: Single extremity supported, Feet supported Sitting balance-Leahy Scale: Poor     Standing balance support: Bilateral upper extremity supported Standing balance-Leahy Scale: Zero Standing balance comment: dependent on UE support and mod-maxA                           ADL either performed or  assessed with clinical judgement   ADL Overall ADL's : Needs assistance/impaired                         Toilet Transfer: Maximal assistance;Stand-pivot;BSC/3in1 Toilet Transfer Details (indicate cue type and reason): MaxA +1/  modA +2 for stand pivot transfers using RUE for hand held assist and LUE supported at elbow with heavy assist for LLE to weight shift to take steps.         Functional mobility during ADLs: Moderate assistance;+2 for physical assistance;+2 for safety/equipment;Maximal assistance;Cueing for safety;Cueing for sequencing General ADL Comments: Pt's OT session focused on following simple commands, transfers and ADL functional mobility as pt was more alert. Pt attempted to use BSC, but was unable to relieve self. Pt ambulating with modA+2 and heavy almost  maxA on L side for weightshifting and managing LLE to take steps.    Extremity/Trunk Assessment Upper Extremity Assessment Upper Extremity Assessment: LUE deficits/detail LUE Deficits / Details: Holding in flexed position; able to extend to -10* elbow extension with PROM; wrist and hand can be positioned, but minimal AROM noted. LUE Coordination: decreased fine motor;decreased gross motor   Lower Extremity Assessment Lower Extremity Assessment: Defer to PT evaluation;LLE deficits/detail LLE Deficits / Details: weakness        Vision   Vision Assessment?: Yes;Vision impaired- to be further tested in functional context Eye Alignment: Impaired (comment) Ocular Range of Motion: Impaired-to be further tested in functional context Additional Comments: L eye staying mostly closed; R eye appears, WFLs. Pt unable to see clock on wall with both eyes open. Pt unable to fully attend to task for specific visual testing.   Perception     Praxis      Cognition Arousal/Alertness: Lethargic, Awake/alert Behavior During Therapy: Flat affect, Restless Overall Cognitive Status: Difficult to assess Area of Impairment: Orientation, Attention, Memory, Following commands, Safety/judgement, Awareness, Problem solving                 Orientation Level: Disoriented to, Situation, Place Current Attention Level: Focused Memory: Decreased short-term  memory Following Commands: Follows one step commands inconsistently Safety/Judgement: Decreased awareness of safety, Decreased awareness of deficits Awareness: Intellectual Problem Solving: Slow processing, Decreased initiation, Difficulty sequencing, Requires verbal cues, Requires tactile cues General Comments: Pt following simple commands; very concerned when turning whether to go L or R and wanting to try to get it right. Pt asked to find clock to see what time it is and pt able to find clock, but stating incorrect time. Pt more alert today and more consistently following 1 step commands ~75% accuracy. Pt's R eye open for most of session; L eye intermittently.        Exercises      Shoulder Instructions       General Comments VSS on RA. R handed mitt reapplied.    Pertinent Vitals/ Pain       Pain Assessment Pain Assessment: Faces Faces Pain Scale: Hurts even more Breathing: normal Negative Vocalization: none Facial Expression: smiling or inexpressive Body Language: relaxed Consolability: no need to console PAINAD Score: 0 Facial Expression: Relaxed, neutral Body Movements: Protection Muscle Tension: Relaxed Compliance with ventilator (intubated pts.): N/A Vocalization (extubated pts.): N/A CPOT Total: 1 Pain Location: L hip Pain Descriptors / Indicators: Grimacing, Discomfort Pain Intervention(s): Monitored during session, Repositioned, Heat applied  Home Living  Prior Functioning/Environment              Frequency  Min 2X/week        Progress Toward Goals  OT Goals(current goals can now be found in the care plan section)  Progress towards OT goals: Progressing toward goals  Acute Rehab OT Goals Patient Stated Goal: to go to rehab (unable to state) OT Goal Formulation: Patient unable to participate in goal setting Potential to Achieve Goals: Good  Plan Discharge plan remains appropriate     Co-evaluation    PT/OT/SLP Co-Evaluation/Treatment: Yes Reason for Co-Treatment: Complexity of the patient's impairments (multi-system involvement);Necessary to address cognition/behavior during functional activity;To address functional/ADL transfers PT goals addressed during session: Mobility/safety with mobility;Balance;Strengthening/ROM OT goals addressed during session: ADL's and self-care;Strengthening/ROM      AM-PAC OT "6 Clicks" Daily Activity     Outcome Measure   Help from another person eating meals?: Total Help from another person taking care of personal grooming?: A Lot Help from another person toileting, which includes using toliet, bedpan, or urinal?: Total Help from another person bathing (including washing, rinsing, drying)?: Total Help from another person to put on and taking off regular upper body clothing?: A Lot Help from another person to put on and taking off regular lower body clothing?: Total 6 Click Score: 8    End of Session Equipment Utilized During Treatment: Gait belt  OT Visit Diagnosis: Unsteadiness on feet (R26.81);Muscle weakness (generalized) (M62.81);Ataxia, unspecified (R27.0);Other symptoms and signs involving cognitive function;Hemiplegia and hemiparesis;Low vision, both eyes (H54.2) Hemiplegia - Right/Left: Left Hemiplegia - dominant/non-dominant: Non-Dominant Hemiplegia - caused by: Other cerebrovascular disease   Activity Tolerance Patient limited by fatigue   Patient Left in bed;with call bell/phone within reach;with bed alarm set   Nurse Communication Mobility status        Time: 9476-5465 OT Time Calculation (min): 35 min  Charges: OT General Charges $OT Visit: 1 Visit OT Treatments $Cognitive Funtion inital: Initial 15 mins  Jefferey Pica, OTR/L Acute Rehabilitation Services Office: [phone]   [id number] V CBSWH 05/24/2022, 10:51 AM

## 2022-05-24 NOTE — Progress Notes (Addendum)
Speech Language Pathology Treatment: Dysphagia;Cognitive-Linquistic  Patient Details Name: April Saunders MRN: [id number] DOB: Mar 29, 1956 Today's Date: 05/24/2022 Time: 7948-0165 SLP Time Calculation (min) (ACUTE ONLY): 16 min  Assessment / Plan / Recommendation Clinical Impression  Pt answered personal information questions with orientation including self, place only.  Able to state daughter's names and min information otherwise.  Pt able to follow simple directives, but alertness level impeded session.  Pt grimacing in pain stating "my hip hurts", but eyes closed with mod verbal/tactile cues provided during brief intake of puree/thin liquids via straw.  Nursing/ST provided mod cueing for intake safety and repositioning prior to intake.  Recommend continuing ST in acute setting for diet tolerance/cognitive impairment as pt able.   HPI HPI: Pt is a 66 y.o. female who presented with fatigue, weakness for 3 to 4 weeks with progression of generalized weakness , and diffuclty with balance. In the ED, patient had left upper extremity deficit. MRI brain 7/2: Multifocal, but also somewhat infiltrative and hypercellular enhancing brain masses involving both cerebral hemispheres, the brainstem, and the right cerebellum. Neurosurgery recommended Decadron and Keppra. Pt s/p  stereotactic brain biopsy 7/5. PMH: HLD, HTN, DJD, obesity, trigeminal neuralgia.ST evaluated on 05/17/22 for swallowing with Dysphagia 3/thin liquids recommended; f/u Speech/language evaluation completed on 05/18/22 with deficits noted with cognition.  ST f/u for diet tolerance/cog tx, but d/t pt waxing/waning regarding alertness level, tx session have been unable to be completed since 05/18/22.      SLP Plan  Continue with current plan of care      Recommendations for follow up therapy are one component of a multi-disciplinary discharge planning process, led by the attending physician.  Recommendations may be updated based on patient status,  additional functional criteria and insurance authorization.    Recommendations  Diet recommendations: Dysphagia 3 (mechanical soft);Thin liquid Liquids provided via: Straw Medication Administration: Whole meds with puree Supervision: Staff to assist with self feeding;Full supervision/cueing for compensatory strategies Compensations: Slow rate;Small sips/bites;Minimize environmental distractions Postural Changes and/or Swallow Maneuvers: Seated upright 90 degrees                Oral Care Recommendations: Oral care BID;Staff/trained caregiver to provide oral care Follow Up Recommendations: Other (comment) (TBD) Assistance recommended at discharge: Frequent or constant Supervision/Assistance SLP Visit Diagnosis: Dysphagia, unspecified (R13.10) Plan: Continue with current plan of care           Elvina Sidle, M.S., CCC-SLP  05/24/2022, 1:36 PM

## 2022-05-24 NOTE — TOC Progression Note (Signed)
Transition of Care [facility]) - Progression Note    Patient Details  Name: April Saunders MRN: [id number] Date of Birth: 01-18-1956  Transition of Care [facility]) CM/SW [place], RN Phone [phone]  05/24/2022, 4:03 PM  Clinical Narrative:    TOC continues to follow. Patient is being considered for CIR. Barbara with CIR to open case with [facility] with hope to admit patient to CIR. Neuro oncology consulted with recs for palliative consult.         Expected Discharge Plan and Services                                                 Social Determinants of Health (SDOH) Interventions    Readmission Risk Interventions     No data to display

## 2022-05-24 NOTE — Progress Notes (Signed)
Physical Therapy Treatment Patient Details Name: April Saunders MRN: [id number] DOB: [birth year] Today's Date: 05/24/2022   History of Present Illness The pt is a 66 yo female presenting 7/2 with increased weakness and falls in last 3 weeks. CT revealed a 2.5 cm hypercellular mass with extensive vasogenic edema centered at the right deep gray nuclei with possible 2nd lesion in the right cerebellum. Pt now s/p stereotactic brain biopsy on 7/5. PMH includes: DJD, HLD, HTN, and trigenimal neuralgia.    PT Comments    The pt was agreeable to session with focus on OOB mobility and gait progression, improved alertness and responsiveness to conversation today. The pt continues to need up to modA to maintain static sitting and manage bed mobility due to poor motor control, strength, and awareness. Falling frequently to L without assist. The pt was able to complete transfers with modA of 2 and blocking of LLE, and progressed to short bouts of gait in the room with maxA to L side and modA to R UE. The pt  needed blocking of L knee with stance, assist to facilitate wt shift to R for steps, and mod-maxA to support for balance. Continue to recommend acute inpatient rehab when medically stable for d/c.     Recommendations for follow up therapy are one component of a multi-disciplinary discharge planning process, led by the attending physician.  Recommendations may be updated based on patient status, additional functional criteria and insurance authorization.  Follow Up Recommendations  Acute inpatient rehab (3hours/day)     Assistance Recommended at Discharge Frequent or constant Supervision/Assistance  Patient can return home with the following A lot of help with walking and/or transfers;A lot of help with bathing/dressing/bathroom;Assistance with cooking/housework;Assistance with feeding;Direct supervision/assist for medications management;Direct supervision/assist for financial management;Assist for  transportation;Help with stairs or ramp for entrance   Equipment Recommendations  Wheelchair (measurements PT);Wheelchair cushion (measurements PT)    Recommendations for Other Services       Precautions / Restrictions Precautions Precautions: Fall Precaution Comments: incontinent of urine Restrictions Weight Bearing Restrictions: No     Mobility  Bed Mobility Overal bed mobility: Needs Assistance Bed Mobility: Supine to Sit, Sit to Supine     Supine to sit: Mod assist Sit to supine: Mod assist   General bed mobility comments: modA to initiate and complete trunk elevation but pt able to assist with LE and pull on therapist for trunk elevation, pt assisting to elevate LE to return to bed. also looked to L with max cues to seek out pillow to lay down trunk    Transfers Overall transfer level: Needs assistance Equipment used: 1 person hand held assist, 2 person hand held assist Transfers: Sit to/from Stand, Bed to chair/wheelchair/BSC Sit to Stand: Mod assist Stand pivot transfers: +2 physical assistance, Mod assist         General transfer comment: modA to manage initial stand with pt holding PT arms in front-facing transfer. maxA to pivot to [facility] with +2 to manage briefs for pt to use commode. modA to stand from [facility]    Ambulation/Gait Ambulation/Gait assistance: Max assist, +2 physical assistance Gait Distance (Feet): 4 Feet (+ 12 + 12 ft) Assistive device: 2 person hand held assist Gait Pattern/deviations: Step-to pattern, Step-through pattern, Decreased stride length, Decreased weight shift to left, Knee flexed in stance - left, Knees buckling, Shuffle       General Gait Details: pt with poor coordination of movement of LLE, maxA to maintain balance, block L knee in stance,  and facilitate wt shift to clear LLE.      Balance Overall balance assessment: Needs assistance Sitting-balance support: Single extremity supported, Feet supported Sitting balance-Leahy  Scale: Poor     Standing balance support: Bilateral upper extremity supported Standing balance-Leahy Scale: Zero Standing balance comment: dependent on UE support and mod-maxA                            Cognition Arousal/Alertness: Lethargic, Awake/alert Behavior During Therapy: Flat affect, Restless Overall Cognitive Status: Difficult to assess Area of Impairment: Orientation, Attention, Memory, Following commands, Safety/judgement, Awareness, Problem solving                 Orientation Level: Disoriented to, Situation, Place Current Attention Level: Focused Memory: Decreased short-term memory Following Commands: Follows one step commands inconsistently Safety/Judgement: Decreased awareness of safety, Decreased awareness of deficits Awareness: Intellectual Problem Solving: Slow processing, Decreased initiation, Difficulty sequencing, Requires verbal cues, Requires tactile cues General Comments: Pt following simple commands; very concerned when turning whether to go L or R and wanting to try to get it right. Pt asked to find clock to see what time it is and pt able to find clock, but stating incorrect time. Pt more alert today and more consistently following 1 step commands ~75% accuracy. Pt's R eye open for most of session; L eye intermittently.        Exercises      General Comments General comments (skin integrity, edema, etc.): VSS on RA, R handed mitt reapplied      Pertinent Vitals/Pain Pain Assessment Pain Assessment: Faces Faces Pain Scale: Hurts even more Breathing: normal Negative Vocalization: none Facial Expression: smiling or inexpressive Body Language: relaxed Consolability: no need to console PAINAD Score: 0 Pain Location: L hip Pain Descriptors / Indicators: Grimacing, Discomfort Pain Intervention(s): Limited activity within patient's tolerance, Monitored during session, Repositioned, Heat applied     PT Goals (current goals can now be  found in the care plan section) Acute Rehab PT Goals Patient Stated Goal: none stated PT Goal Formulation: Patient unable to participate in goal setting Time For Goal Achievement: 06/02/22 Potential to Achieve Goals: Good Progress towards PT goals: Progressing toward goals    Frequency    Min 4X/week      PT Plan Current plan remains appropriate    Co-evaluation PT/OT/SLP Co-Evaluation/Treatment: Yes Reason for Co-Treatment: For patient/therapist safety;Necessary to address cognition/behavior during functional activity;To address functional/ADL transfers PT goals addressed during session: Mobility/safety with mobility;Balance;Strengthening/ROM OT goals addressed during session: ADL's and self-care;Strengthening/ROM      AM-PAC PT "6 Clicks" Mobility   Outcome Measure  Help needed turning from your back to your side while in a flat bed without using bedrails?: Total Help needed moving from lying on your back to sitting on the side of a flat bed without using bedrails?: Total Help needed moving to and from a bed to a chair (including a wheelchair)?: Total Help needed standing up from a chair using your arms (e.g., wheelchair or bedside chair)?: Total Help needed to walk in hospital room?: Total Help needed climbing 3-5 steps with a railing? : Total 6 Click Score: 6    End of Session Equipment Utilized During Treatment: Gait belt Activity Tolerance: Patient limited by lethargy Patient left: in bed;with bed alarm set;with call bell/phone within reach;with nursing/sitter in room;with family/visitor present Nurse Communication: Mobility status PT Visit Diagnosis: Other abnormalities of gait and mobility (R26.89);Muscle weakness (generalized) (M62.81);Difficulty  in walking, not elsewhere classified (R26.2)     Time: 5284-1324 PT Time Calculation (min) (ACUTE ONLY): 32 min  Charges:  $Gait Training: 8-22 mins                     [place], PT, DPT   Acute Rehabilitation  Department   Sandra Cockayne 05/24/2022, 10:54 AM

## 2022-05-24 NOTE — Progress Notes (Signed)
PHARMACIST - PHYSICIAN COMMUNICATION  DR:   Alfredia Ferguson  CONCERNING: IV to Oral Route Change Policy  RECOMMENDATION: This patient is receiving Protonix and Keppra by the intravenous route.  Based on criteria approved by the Pharmacy and Therapeutics Committee, the intravenous medication(s) is/are being converted to the equivalent oral dose form(s).   DESCRIPTION: These criteria include: The patient is eating (either orally or via tube) and/or has been taking other orally administered medications for a least 24 hours The patient has no evidence of active gastrointestinal bleeding or impaired GI absorption (gastrectomy, short bowel, patient on TNA or NPO).  If you have questions about this conversion, please contact the Pharmacy Department  '[]'$   [phone] )  [facility] '[]'$   [phone] )  [facility] '[x]'$   [phone] )  [facility] '[]'$   [phone] )  [facility] '[]'$   [phone] )  [place], [facility] 05/24/2022 8:03 AM

## 2022-05-24 NOTE — Progress Notes (Signed)
Inpatient Rehabilitation Admissions Coordinator   I will open case with [place] for possible admit to CIR if approved.  Danne Baxter, RN, MSN Rehab Admissions Coordinator [phone] 05/24/2022 1:51 PM

## 2022-05-25 DIAGNOSIS — Z7189 Other specified counseling: Secondary | ICD-10-CM

## 2022-05-25 DIAGNOSIS — I10 Essential (primary) hypertension: Secondary | ICD-10-CM | POA: Diagnosis not present

## 2022-05-25 DIAGNOSIS — D496 Neoplasm of unspecified behavior of brain: Secondary | ICD-10-CM | POA: Diagnosis not present

## 2022-05-25 DIAGNOSIS — E785 Hyperlipidemia, unspecified: Secondary | ICD-10-CM | POA: Diagnosis not present

## 2022-05-25 LAB — COMPREHENSIVE METABOLIC PANEL
ALT: 55 U/L — ABNORMAL HIGH (ref 0–44)
AST: 40 U/L (ref 15–41)
Albumin: 3.1 g/dL — ABNORMAL LOW (ref 3.5–5.0)
Alkaline Phosphatase: 44 U/L (ref 38–126)
Anion gap: 10 (ref 5–15)
BUN: 17 mg/dL (ref 8–23)
CO2: 26 mmol/L (ref 22–32)
Calcium: 8.9 mg/dL (ref 8.9–10.3)
Chloride: 100 mmol/L (ref 98–111)
Creatinine, Ser: 0.73 mg/dL (ref 0.44–1.00)
GFR, Estimated: >60 mL/min (ref >60)
Glucose, Bld: 124 mg/dL — ABNORMAL HIGH (ref 70–99)
Potassium: 4.2 mmol/L (ref 3.5–5.1)
Sodium: 136 mmol/L (ref 135–145)
Total Bilirubin: 1 mg/dL (ref 0.3–1.2)
Total Protein: 5.5 g/dL — ABNORMAL LOW (ref 6.5–8.1)

## 2022-05-25 LAB — CBC WITH DIFFERENTIAL/PLATELET
Abs Immature Granulocytes: 0.1 10*3/uL — ABNORMAL HIGH (ref 0.00–0.07)
Basophils Absolute: 0 10*3/uL (ref 0.0–0.1)
Basophils Relative: 0 %
Eosinophils Absolute: 0 10*3/uL (ref 0.0–0.5)
Eosinophils Relative: 0 %
HCT: 38.9 % (ref 36.0–46.0)
Hemoglobin: 13.2 g/dL (ref 12.0–15.0)
Immature Granulocytes: 1 %
Lymphocytes Relative: 8 %
Lymphs Abs: 0.9 10*3/uL (ref 0.7–4.0)
MCH: 26.7 pg (ref 26.0–34.0)
MCHC: 33.9 g/dL (ref 30.0–36.0)
MCV: 78.6 fL — ABNORMAL LOW (ref 80.0–100.0)
Monocytes Absolute: 0.6 10*3/uL (ref 0.1–1.0)
Monocytes Relative: 5 %
Neutro Abs: 10.9 10*3/uL — ABNORMAL HIGH (ref 1.7–7.7)
Neutrophils Relative %: 86 %
Platelets: 163 10*3/uL (ref 150–400)
RBC: 4.95 MIL/uL (ref 3.87–5.11)
RDW: 13.8 % (ref 11.5–15.5)
WBC: 12.6 10*3/uL — ABNORMAL HIGH (ref 4.0–10.5)
nRBC: 0 % (ref 0.0–0.2)

## 2022-05-25 LAB — PHOSPHORUS: Phosphorus: 3.2 mg/dL (ref 2.5–4.6)

## 2022-05-25 LAB — MAGNESIUM: Magnesium: 1.9 mg/dL (ref 1.7–2.4)

## 2022-05-25 LAB — GLUCOSE, CAPILLARY: Glucose-Capillary: 114 mg/dL — ABNORMAL HIGH (ref 70–99)

## 2022-05-25 NOTE — Consult Note (Signed)
Consultation Note Date: 05/25/2022   Patient Name: April Saunders  DOB: 03/18/1956  MRN: [id number]  Age / Sex: 66 y.o., female  PCP: Willene Hatchet, NP Referring Physician: Kerney Elbe, DO  Reason for Consultation: [place]ollings discussion  HPI/Patient Profile: 66 y.o. female  with past medical history of hypertension hyperlipidemia degenerative joint disease obesity admitted on 05/13/2022 with fatigue and weakness ongoing for 3 to 4 weeks with difficulty balancing herself and falls.  CT of the brain showed lesions concerning for malignancy.  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  She has been started on steroids and Keppra.  Was seen by neuro-oncology.  Their current recommendation is to continue steroids and supportive care, however her clinical functional status is poor and she is not a current candidate for treatment planning.  Palliative medicine consulted for goals of care.  Primary Decision Maker NEXT OF KIN husband April Saunders, and children  Discussion: Chart reviewed including labs, progress notes from this and current admissions as well as care everywhere, imaging. Noted initial plan has been for Yee to go to CIR in efforts to increase functional status so that she can begin treatment.  However she has been very lethargic, she is not toileting herself, she is eating very little. Evaluated patient she was somnolent, resting well.  Unable to participate in goals of care conversation. Her husband April Saunders and a close friend April Saunders were at bedside.  Introduced palliative medicine.   We discussed the need to address goals of care and the plan going forward for Marg. April Saunders expressed that there were other family members who would like to be involved in our discussion. Plan was made to meet with multiple family members tomorrow around 1 PM.      SUMMARY OF  RECOMMENDATIONS -Continue current plan of care - Plan to meet with multiple family members tomorrow at 1 PM  Code Status/Advance Care Planning: DNR   Prognosis:   Unable to determine  Discharge Planning: To Be Determined  Primary Diagnoses: Present on Admission:  Brain tumor ([place])  Dyslipidemia  Hypertension   Review of Systems  Physical Exam  Vital Signs: BP 129/79 (BP Location: Left Arm)   Pulse 60   Temp 98.3 F (36.8 C) (Oral)   Resp 16   Ht '5\' 3"'$  (1.6 m)   Wt 61 kg   SpO2 98%   BMI 23.82 kg/m  Pain Scale: PAINAD   Pain Score: Asleep   SpO2: SpO2: 98 % O2 Device:SpO2: 98 % O2 Flow Rate: .   IO: Intake/output summary:  Intake/Output Summary (Last 24 hours) at 05/25/2022 1644 Last data filed at 05/25/2022 1000 Gross per 24 hour  Intake 480 ml  Output 900 ml  Net -420 ml    LBM: Last BM Date :  (PTA) Baseline Weight: Weight: 64.4 kg Most recent weight: Weight: 61 kg       Thank you for this consult. Palliative medicine will continue to follow and assist as needed.  Time Total: 60 minutes  Greater than 50%  of this time was spent counseling and coordinating care related to the above assessment and plan.  Signed by: Mariana Kaufman, AGNP-C Palliative Medicine    Please contact Palliative Medicine Team phone at [phone] for questions and concerns.  For individual provider: See Shea Evans

## 2022-05-25 NOTE — Progress Notes (Signed)
Physical Therapy Treatment Patient Details Name: April Saunders MRN: [id number] DOB: [birth year] Today's Date: 05/25/2022   History of Present Illness The pt is a 66 yo female presenting 7/2 with increased weakness and falls in last 3 weeks. CT revealed a 2.5 cm hypercellular mass with extensive vasogenic edema centered at the right deep gray nuclei with possible 2nd lesion in the right cerebellum. Pt now s/p stereotactic brain biopsy on 7/5. PMH includes: DJD, HLD, HTN, and trigenimal neuralgia.    PT Comments    The pt was seen for continued mobility progression today, continues to need mod-maxA at times depending on amount of cues given. She was able to manage pivot to [facility] and short bout of stepping towards L at this time, but progressively needed increased assist due to posterior lean and difficulty with advancement of LLE. The pt's cognition and mobility continues to fluctuate, but will continue to benefit from skilled PT to progress functional capacity for OOB mobility and safety with transfers.     Recommendations for follow up therapy are one component of a multi-disciplinary discharge planning process, led by the attending physician.  Recommendations may be updated based on patient status, additional functional criteria and insurance authorization.  Follow Up Recommendations  Acute inpatient rehab (3hours/day)     Assistance Recommended at Discharge Frequent or constant Supervision/Assistance  Patient can return home with the following A lot of help with walking and/or transfers;A lot of help with bathing/dressing/bathroom;Assistance with cooking/housework;Assistance with feeding;Direct supervision/assist for medications management;Direct supervision/assist for financial management;Assist for transportation;Help with stairs or ramp for entrance   Equipment Recommendations  Wheelchair (measurements PT);Wheelchair cushion (measurements PT)    Recommendations for Other Services        Precautions / Restrictions Precautions Precautions: Fall Precaution Comments: incontinent of urine Restrictions Weight Bearing Restrictions: No     Mobility  Bed Mobility Overal bed mobility: Needs Assistance Bed Mobility: Supine to Sit, Sit to Supine     Supine to sit: Mod assist Sit to supine: Mod assist   General bed mobility comments: modA to initiate and complete trunk elevation but pt able to assist with LE and pull on therapist for trunk elevation, pt assisting to elevate LE to return to bed. also looked to L with max cues to seek out pillow to lay down trunk    Transfers Overall transfer level: Needs assistance Equipment used: 1 person hand held assist Transfers: Sit to/from Stand, Bed to chair/wheelchair/BSC Sit to Stand: Mod assist, Max assist Stand pivot transfers: Mod assist, Max assist         General transfer comment: mod-maxA depending on cues given. with max cues, pt able to assist more and complete with modA. with slightly less cues, pt falling back and does not correct or initiate standing    Ambulation/Gait Ambulation/Gait assistance: Max assist Gait Distance (Feet): 3 Feet Assistive device: 1 person hand held assist Gait Pattern/deviations: Step-to pattern, Step-through pattern, Decreased stride length, Decreased weight shift to left, Knee flexed in stance - left, Knees buckling, Shuffle       General Gait Details: pt with poor coordination of movement of LLE, maxA to maintain balance, block L knee in stance, and facilitate wt shift to clear LLE.       Balance Overall balance assessment: Needs assistance Sitting-balance support: Single extremity supported, Feet supported Sitting balance-Leahy Scale: Poor     Standing balance support: Bilateral upper extremity supported Standing balance-Leahy Scale: Zero Standing balance comment: dependent on UE support and mod-maxA  Cognition Arousal/Alertness:  Lethargic, Awake/alert Behavior During Therapy: Flat affect, Restless Overall Cognitive Status: Difficult to assess Area of Impairment: Orientation, Attention, Memory, Following commands, Safety/judgement, Awareness, Problem solving                 Orientation Level: Disoriented to, Situation, Place Current Attention Level: Focused Memory: Decreased short-term memory Following Commands: Follows one step commands inconsistently Safety/Judgement: Decreased awareness of safety, Decreased awareness of deficits Awareness: Intellectual Problem Solving: Slow processing, Decreased initiation, Difficulty sequencing, Requires verbal cues, Requires tactile cues General Comments: Pt following simple commands; more alert today, but did ask for visitors repeatedly after being told they were not in room. Pt needing increased cues for sequencing moblity        Exercises      General Comments General comments (skin integrity, edema, etc.): VSS on RA      Pertinent Vitals/Pain Pain Assessment Pain Assessment: Faces Faces Pain Scale: Hurts little more Pain Location: L hip Pain Descriptors / Indicators: Grimacing, Discomfort Pain Intervention(s): Monitored during session, Limited activity within patient's tolerance, Repositioned     PT Goals (current goals can now be found in the care plan section) Acute Rehab PT Goals Patient Stated Goal: none stated PT Goal Formulation: Patient unable to participate in goal setting Time For Goal Achievement: 06/02/22 Potential to Achieve Goals: Good Progress towards PT goals: Progressing toward goals    Frequency    Min 4X/week      PT Plan Current plan remains appropriate    Co-evaluation PT/OT/SLP Co-Evaluation/Treatment: Yes            AM-PAC PT "6 Clicks" Mobility   Outcome Measure  Help needed turning from your back to your side while in a flat bed without using bedrails?: Total Help needed moving from lying on your back to  sitting on the side of a flat bed without using bedrails?: Total Help needed moving to and from a bed to a chair (including a wheelchair)?: Total Help needed standing up from a chair using your arms (e.g., wheelchair or bedside chair)?: Total Help needed to walk in hospital room?: Total Help needed climbing 3-5 steps with a railing? : Total 6 Click Score: 6    End of Session Equipment Utilized During Treatment: Gait belt Activity Tolerance: Patient limited by lethargy Patient left: in bed;with bed alarm set;with call bell/phone within reach;with family/visitor present Nurse Communication: Mobility status PT Visit Diagnosis: Other abnormalities of gait and mobility (R26.89);Muscle weakness (generalized) (M62.81);Difficulty in walking, not elsewhere classified (R26.2)     Time: 1696-7893 PT Time Calculation (min) (ACUTE ONLY): 25 min  Charges:  $Therapeutic Exercise: 8-22 mins $Therapeutic Activity: 8-22 mins                     West Carbo, PT, DPT   Acute Rehabilitation Department   Sandra Cockayne 05/25/2022, 6:09 PM

## 2022-05-25 NOTE — Progress Notes (Signed)
PROGRESS NOTE    April Saunders  DZH:[id number] DOB: 1956/09/21 DOA: 05/13/2022 PCP: April Hatchet, NP   Brief Narrative:  April Saunders is a 66 y.o. female with medical history significant of hypertension, hyperlipidemia, degenerative joint disease, obesity presented hospital with fatigue, weakness for 3 to 4 weeks with difficulty balancing himself  and falls.  In the ED, patient had left upper extremity deficit.  Further work-up in the ED showed tumor in the right deep gray matter with extensive vasogenic edema and possible lesion in the right cerebellum. Case was discussed with  neurosurgery.  Patient was then initiated on dexamethasone and Keppra and was admitted to the hospital for further evaluation and treatment.   During hospitalization, patient has been followed by neurosurgery.  CT scan of the brain brain protocol showed enhancing lesions in the brain.  CT scan of the chest abdomen and pelvis done 05/15/2022 without any evidence of lymphadenopathy.  She underwent a stereotactic brain biopsy with Dr. Amado Saunders on 05/16/2022 and CT of the head was done yesterday due to increased lethargy which was unremarkable.  Overall she is currently stable though her prognosis is extremely poor.  Neurosurgery felt that her strength is slightly improved and current plan is to have neuro-oncology see the patient as well as continuing Keppra 500 g p.o. twice daily and likely planning for inpatient rehab if she is a candidate.  Despite aggressive steroid dosing she continues to have periods of lethargy, confusion and hemiparesis and she is not able to stand, toilet or feed herself at this time and nor oncology evaluated and given that her functional status remains poor they are recommending a palliative care consult to help further with goals of care and disposition at this time but if her clinical status improves they can consider proceeding with staging and treatment planning.  Currently awaiting palliative care  discussions.  Assessment and plan Principal Problem:   Brain tumor [facility]) Active Problems:   Hypertension   Dyslipidemia   Hypokalemia   Multifocal Brain tumors with associated vasogenic edema and left sided weakness -Neurosurgery on board.   -Patient had presented with generalized weakness, fatigue, difficulty balancing herself as well as falls with left upper extremity deficit. -MRI shows multifocal somewhat infiltrative and hypercellular enhancing brain masses involving both cerebral hemispheres and brainstem and citrate cerebellum.  Possibility of CNS lymphoma.  -Extensive brain and brainstem edema with midline shift of 7 mm was reported.   -Continuing Dexamethasone IV 6 mg every 6 and levetiracetam 1000 mg IV every 12 as well as carbamazepine 200 mg p.o. twice daily.   -CT  brain lab protocol was then performed which showed multiple brightly enhancing mass lesions throughout the infratentorial and supratentorial brain most consistent with CNS lymphoma with vasogenic edema.   -Patient subsequently underwent stereotactic brain biopsy per neurosurgery, Dr. Marcello Saunders, 05/16/2022. 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 -Repeat head CT scan without contrast on 05/22/2022 showed "Marked motion degradation. No additional complication evident following biopsy of the inferior basal ganglia lesion on the right. Maximal measurement today is 2.6 cm. The study is markedly  regarded by motion and assessment for subtle change is not  possible. Regional edema on the right causes mass effect with right-to-left shift estimated at 7 mm, similar to the prior exam.  No visible edema in the inferior cerebellum on the right or in the left temporal lobe." -CT scan of the chest abdomen and pelvis with no findings of lymphadenopathy to suggest lymphoma.  We will follow neurosurgery recommendations. -Continue seizure precautions -Neurooncology evaluated and unfortunately her status  remains poor despite high-dose corticosteroids and she is not independent with ADLs at this time.  Neurooncology recommends palliative care consultation and continuing steroids and supportive care but if her clinical status improves then they can consider proceeding with staging and treatment but overall the prognosis has been discussed with the patient's daughter by the neuro oncologist. -Follow Neurosurgery and Neuro-Oncology Recc's -Palliative care has been consulted for further goals of care discussion given that her prognosis is extremely poor and that she has not made very much progress at all   Hyperlipidemia. -Continue with Simvastatin 20 mg p.o. daily  Mild Hypokalemia. -Improved.  Patient's potassium is now 4.2 -Continue monitoring for the necessary -Mag level is now 1.9 -Continue to monitor and trend and repeat CMP in a.m.  Essential Hypertension. -Continue to hold antihypertensives for now.   -Continue to monitor blood pressure protocol -Patient is on HCTZ and amlodipine at home. -Last blood pressure reading was 104/60  History of Trigeminal Neuralgia.   -Patient takes carbamazepine at home and this has been resumed  Leukocytosis -In the setting of steroid demargination given that she is on dexamethasone -WBC went from 7.4 -> 11.3 -> 12.4 -> 12.6 Continue to monitor for signs and symptoms of infection; no overt infection noted -Repeat CBC in a.m.  Hypoalbuminemia -Patient's Albumin Level went from 3.2 -> 3.0 -> 3.1 -Continue to Monitor and Trend -Repeat CMP in the AM   Hyperglycemia -In the setting of steroid demargination -Continue monitor and trend CBGs and they have been ranging from 112-132 daily on BMP/CMP and CBGs ranging from 114-136 on fingersticks -If necessary will place on sensitive NovoLog cycle insulin   DVT prophylaxis: Place and maintain sequential compression device Start: 05/17/22 0749 SCDs Start: 05/16/22 1718 SCDs Start: 05/13/22 1051    Code  Status: Full Code Family Communication: No family present at bedside  Disposition Plan:  Level of care: Progressive Status is: Inpatient Remains inpatient appropriate because: Pending further Palliative Care Discussions and Improvement    Consultants:  Neuro-Surgery Neuro-Oncology Palliative Care Medicine  Procedures:  CT head 05/13/2022, 05/17/2022 CT chest abdomen and pelvis 05/15/2022 MRI brain 05/13/2022 CT BrainLab head 05/14/2022 Stereotactic brain biopsy application of cranial navigation procedure note per Dr. Marcello Saunders neurosurgery 05/16/2022.  Antimicrobials:  Anti-infectives (From admission, onward)    Start     Dose/Rate Route Frequency Ordered Stop   05/16/22 2000  ceFAZolin (ANCEF) IVPB 1 g/50 mL premix        1 g 100 mL/hr over 30 Minutes Intravenous Every 8 hours 05/16/22 1717 05/17/22 1445   05/16/22 0745  ceFAZolin (ANCEF) IVPB 2g/100 mL premix        2 g 200 mL/hr over 30 Minutes Intravenous On call to O.R. 05/16/22 8588 05/16/22 1210       Subjective: Seen and examined at bedside and she was a little bit more interactive today but still remains lethargic.  Has not been really doing her ADLs and was unable to participate in SLP evaluation.  Currently awaiting palliative care for goals of care discussion.  No family currently bedside.  She denies any concerns or complaints at this time.  Objective: Vitals:   05/25/22 0309 05/25/22 0500 05/25/22 0709 05/25/22 1110  BP:   115/66 104/60  Pulse:   (!) 48 (!) 52  Resp:   14 13  Temp: 98 F (36.7 C)  98 F (36.7 C) 97.7 F (36.5  C)  TempSrc: Oral  Axillary Oral  SpO2: 99%  99% 98%  Weight:  61 kg    Height:        Intake/Output Summary (Last 24 hours) at 05/25/2022 1427 Last data filed at 05/25/2022 1000 Gross per 24 hour  Intake 480 ml  Output 900 ml  Net -420 ml   Filed Weights   05/23/22 0355 05/24/22 0305 05/25/22 0500  Weight: 62.5 kg 62.7 kg 61 kg   Examination: Physical Exam:  Constitutional: WN/WD  African-American female currently in NAD and appears calm and resting Respiratory: Diminished to auscultation bilaterally, no wheezing, rales, rhonchi or crackles. Normal respiratory effort and patient is not tachypenic. No accessory muscle use.  Cardiovascular: RRR, no murmurs / rubs / gallops. S1 and S2 auscultated. No extremity edema. 2+ pedal pulses. No carotid bruits.  Unlabored breathing Abdomen: Soft, non-tender, slightly distended secondary to body habitus. Bowel sounds positive.  GU: Deferred. Musculoskeletal: No clubbing / cyanosis of digits/nails. No joint deformity upper and lower extremities. Skin: No rashes, lesions, ulcers on limited skin evaluation. No induration; Warm and dry.  Neurologic: She is a little bit more interactive today but still remains a little sleepy and drowsy  Data Reviewed: I have personally reviewed following labs and imaging studies  CBC: Recent Labs  Lab 05/19/22 0216 05/20/22 0332 05/21/22 0329 05/22/22 0807 05/23/22 0056 05/24/22 0328 05/25/22 0336  WBC 12.4* 11.1* 10.0 7.4 11.3* 12.4* 12.6*  NEUTROABS 11.1* 9.5*  --  5.7  --  11.0* 10.9*  HGB 12.2 13.4 13.8 12.6 13.1 12.1 13.2  HCT 37.4 40.8 42.1 38.2 38.7 35.5* 38.9  MCV 80.4 80.6 80.3 79.6* 79.5* 78.4* 78.6*  PLT 223 247 228 195 209 212 269   Basic Metabolic Panel: Recent Labs  Lab 05/21/22 0329 05/22/22 0807 05/23/22 0056 05/24/22 0328 05/25/22 0336  NA 139 137 138 137 136  K 3.6 3.9 3.9 3.8 4.2  CL 102 104 105 104 100  CO2 '24 24 23 24 26  '$ GLUCOSE 116* 112* 114* 132* 124*  BUN '14 18 15 12 17  '$ CREATININE 0.69 0.77 0.90 0.67 0.73  CALCIUM 9.5 8.8* 8.8* 9.0 8.9  MG  --   --  2.1 1.9 1.9  PHOS  --   --  2.7 2.9 3.2   GFR: Estimated Creatinine Clearance: 58 mL/min (by C-G formula based on SCr of 0.73 mg/dL). Liver Function Tests: Recent Labs  Lab 05/23/22 0056 05/24/22 0328 05/25/22 0336  AST  --  27 40  ALT  --  43 55*  ALKPHOS  --  42 44  BILITOT  --  0.7 1.0  PROT   --  5.5* 5.5*  ALBUMIN 3.2* 3.0* 3.1*   No results for input(s): "LIPASE", "AMYLASE" in the last 168 hours. No results for input(s): "AMMONIA" in the last 168 hours. Coagulation Profile: No results for input(s): "INR", "PROTIME" in the last 168 hours. Cardiac Enzymes: No results for input(s): "CKTOTAL", "CKMB", "CKMBINDEX", "TROPONINI" in the last 168 hours. BNP (last 3 results) No results for input(s): "PROBNP" in the last 8760 hours. HbA1C: No results for input(s): "HGBA1C" in the last 72 hours. CBG: Recent Labs  Lab 05/21/22 1502 05/22/22 0728 05/23/22 0747 05/24/22 0742 05/25/22 0739  GLUCAP 118* 118* 123* 136* 114*   Lipid Profile: No results for input(s): "CHOL", "HDL", "LDLCALC", "TRIG", "CHOLHDL", "LDLDIRECT" in the last 72 hours. Thyroid Function Tests: No results for input(s): "TSH", "T4TOTAL", "FREET4", "T3FREE", "THYROIDAB" in the last 72 hours. Anemia  Panel: No results for input(s): "VITAMINB12", "FOLATE", "FERRITIN", "TIBC", "IRON", "RETICCTPCT" in the last 72 hours. Sepsis Labs: No results for input(s): "PROCALCITON", "LATICACIDVEN" in the last 168 hours.  No results found for this or any previous visit (from the past 240 hour(s)).   Radiology Studies: No results found.   Scheduled Meds:  amLODipine  2.5 mg Oral Daily   carbamazepine  200 mg Oral BID   dexamethasone (DECADRON) injection  6 mg Intravenous Q6H   levETIRAcetam  500 mg Oral BID   pantoprazole  40 mg Oral Daily   simvastatin  20 mg Oral Daily   Continuous Infusions:   LOS: 12 days   Raiford Noble, DO [facility] Available via Epic secure chat 7am-7pm After these hours, please refer to coverage provider listed on [email or web address] 05/25/2022, 2:27 PM

## 2022-05-25 NOTE — Progress Notes (Signed)
SLP Cancellation Note  Patient Details Name: Moira Umholtz MRN: [id number] DOB: 1956-08-14   Cancelled treatment:       Reason Eval/Treat Not Completed: Fatigue/lethargy limiting ability to participate; pt nodded, but unable to arouse for PO intake; alertness level continues to fluctuate; ST will continue efforts while in acute setting.   Elvina Sidle, M.S., [place] 05/25/2022, 12:22 PM

## 2022-05-26 DIAGNOSIS — Z515 Encounter for palliative care: Secondary | ICD-10-CM

## 2022-05-26 DIAGNOSIS — Z7189 Other specified counseling: Secondary | ICD-10-CM

## 2022-05-26 DIAGNOSIS — E785 Hyperlipidemia, unspecified: Secondary | ICD-10-CM | POA: Diagnosis not present

## 2022-05-26 DIAGNOSIS — D496 Neoplasm of unspecified behavior of brain: Secondary | ICD-10-CM | POA: Diagnosis not present

## 2022-05-26 DIAGNOSIS — I10 Essential (primary) hypertension: Secondary | ICD-10-CM | POA: Diagnosis not present

## 2022-05-26 LAB — COMPREHENSIVE METABOLIC PANEL
ALT: 57 U/L — ABNORMAL HIGH (ref 0–44)
AST: 28 U/L (ref 15–41)
Albumin: 3.4 g/dL — ABNORMAL LOW (ref 3.5–5.0)
Alkaline Phosphatase: 50 U/L (ref 38–126)
Anion gap: 10 (ref 5–15)
BUN: 16 mg/dL (ref 8–23)
CO2: 27 mmol/L (ref 22–32)
Calcium: 9.3 mg/dL (ref 8.9–10.3)
Chloride: 99 mmol/L (ref 98–111)
Creatinine, Ser: 0.76 mg/dL (ref 0.44–1.00)
GFR, Estimated: >60 mL/min (ref >60)
Glucose, Bld: 138 mg/dL — ABNORMAL HIGH (ref 70–99)
Potassium: 4 mmol/L (ref 3.5–5.1)
Sodium: 136 mmol/L (ref 135–145)
Total Bilirubin: 0.4 mg/dL (ref 0.3–1.2)
Total Protein: 6.4 g/dL — ABNORMAL LOW (ref 6.5–8.1)

## 2022-05-26 LAB — CBC WITH DIFFERENTIAL/PLATELET
Abs Immature Granulocytes: 0.1 10*3/uL — ABNORMAL HIGH (ref 0.00–0.07)
Basophils Absolute: 0 10*3/uL (ref 0.0–0.1)
Basophils Relative: 0 %
Eosinophils Absolute: 0 10*3/uL (ref 0.0–0.5)
Eosinophils Relative: 0 %
HCT: 41.6 % (ref 36.0–46.0)
Hemoglobin: 13.9 g/dL (ref 12.0–15.0)
Immature Granulocytes: 1 %
Lymphocytes Relative: 6 %
Lymphs Abs: 0.8 10*3/uL (ref 0.7–4.0)
MCH: 26.6 pg (ref 26.0–34.0)
MCHC: 33.4 g/dL (ref 30.0–36.0)
MCV: 79.7 fL — ABNORMAL LOW (ref 80.0–100.0)
Monocytes Absolute: 0.4 10*3/uL (ref 0.1–1.0)
Monocytes Relative: 3 %
Neutro Abs: 12.1 10*3/uL — ABNORMAL HIGH (ref 1.7–7.7)
Neutrophils Relative %: 90 %
Platelets: 242 10*3/uL (ref 150–400)
RBC: 5.22 MIL/uL — ABNORMAL HIGH (ref 3.87–5.11)
RDW: 13.8 % (ref 11.5–15.5)
WBC: 13.4 10*3/uL — ABNORMAL HIGH (ref 4.0–10.5)
nRBC: 0 % (ref 0.0–0.2)

## 2022-05-26 LAB — GLUCOSE, CAPILLARY: Glucose-Capillary: 120 mg/dL — ABNORMAL HIGH (ref 70–99)

## 2022-05-26 LAB — PHOSPHORUS: Phosphorus: 3.5 mg/dL (ref 2.5–4.6)

## 2022-05-26 LAB — MAGNESIUM: Magnesium: 2.1 mg/dL (ref 1.7–2.4)

## 2022-05-26 NOTE — Progress Notes (Signed)
Daily Progress Note   Patient Name: April Saunders       Date: 05/26/2022 DOB: August 30, 1956  Age: 66 y.o. MRN#: [id number] Attending Physician: Kerney Elbe, DO Primary Care Physician: Willene Hatchet, NP Admit Date: 05/13/2022  Reason for Consultation/Follow-up: Establishing goals of care  Patient Profile/HPI: 66 y.o. female  with past medical history of hypertension hyperlipidemia degenerative joint disease obesity admitted on 05/13/2022 with fatigue and weakness ongoing for 3 to 4 weeks with difficulty balancing herself and falls.  CT of the brain showed lesions concerning for malignancy.  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  She has been started on steroids and Keppra.  Was seen by neuro-oncology.  Their current recommendation is to continue steroids and supportive care, however her clinical functional status is poor and she is not a current candidate for treatment planning.  Palliative medicine consulted for goals of care.  Subjective: Chart reviewed including labs, progress notes from this and current admissions as well as care everywhere, imaging.  Noted she was able to get up to bedside commode today with PT.  Her daughter reports that she also participated some in her bathing and is waking up a bit more and eating a bit more. I met with her daughter [name] at the bedside.  April Saunders had offered for other family members to be present but no one wanted to attend.   Brief life review was conducted-patient has 3 children and several grandchildren.  She was working at [employer] prior to admission.  Spending time with her family is very important.  She enjoys cooking for her family. We discussed patient's diagnosis and prognosis.  [name] tells me she is  aware that patient has been diagnosed with brain tumor.  She understands that the tumor is inoperable.  She also understands that right now April Saunders cannot undergo chemotherapy for this tumor due to her functional status. We discussed goals of care.  [name] shares that she and her family are very faith oriented.  They have high hopes that with some rehab April Saunders will improve in her functional status and be able to undergo some treatment for her cancer in efforts to prolong her life. [name] is aware of the risk that patient may not improve even with going to therapy.  We discussed what goals of  care would be if [place] continue to decline.  April Saunders notes that they would want for April Saunders to be in an inpatient setting such as a hospital or a hospice house.  However they are not ready to transition her to hospice at this time.  They would like to continue the steroids and full supportive care.  And continue with plans for CIR.  Review of Systems  Unable to perform ROS: Mental status change     Physical Exam Vitals and nursing note reviewed.  Constitutional:      Appearance: She is ill-appearing.  Cardiovascular:     Rate and Rhythm: Normal rate.  Pulmonary:     Effort: Pulmonary effort is normal.  Skin:    General: Skin is warm and dry.     Coloration: Skin is pale.  Neurological:     Mental Status: She is disoriented.     Comments: Lethargic but does arouse to my voice and with some stimulation she is able to answer some questions             Vital Signs: BP 139/69 (BP Location: Left Arm)   Pulse 65   Temp 98.1 F (36.7 C) (Axillary)   Resp 14   Ht 5' 3"  (1.6 m)   Wt 61 kg   SpO2 100%   BMI 23.82 kg/m  SpO2: SpO2: 100 % O2 Device: O2 Device: Room Air O2 Flow Rate:    Intake/output summary:  Intake/Output Summary (Last 24 hours) at 05/26/2022 1608 Last data filed at 05/26/2022 1330 Gross per 24 hour  Intake 720 ml  Output 650 ml  Net 70 ml   LBM: Last BM Date :  05/23/22 Baseline Weight: Weight: 64.4 kg Most recent weight: Weight: 61 kg       Palliative Assessment/Data: PPS 30%      Patient Active Problem List   Diagnosis Date Noted   Hypokalemia 05/15/2022   Brain tumor ([place]) 05/13/2022   Trigeminal neuralgia 07/05/2021   Prediabetes 07/06/2020   Diverticulosis 07/06/2020   Dyslipidemia 12/30/2018   Hypertension    Obesity     Palliative Care Assessment & Plan    Assessment/Recommendations/Plan  Continue current plan of care for now Family is interested in pursuing CIR with goal of improving patient's functional status and allowing her to begin chemotherapy PMT will continue to follow   Code Status: Full code  Prognosis:  Unable to determine  Discharge Planning: To Be Determined  Care plan was discussed with patient's daughter [name]  Thank you for allowing the Palliative Medicine Team to assist in the care of this patient.  Total time:  60 mins  Greater than 50%  of this time was spent counseling and coordinating care related to the above assessment and plan.  Mariana Kaufman, AGNP-C Palliative Medicine   Please contact Palliative Medicine Team phone at [phone] for questions and concerns.

## 2022-05-26 NOTE — Progress Notes (Signed)
PROGRESS NOTE    April Saunders  XTK:[id number] DOB: 01-Dec-1955 DOA: 05/13/2022 PCP: Willene Hatchet, NP   Brief Narrative:  April Saunders is a 66 y.o. female with medical history significant of hypertension, hyperlipidemia, degenerative joint disease, obesity presented hospital with fatigue, weakness for 3 to 4 weeks with difficulty balancing himself  and falls.  In the ED, patient had left upper extremity deficit.  Further work-up in the ED showed tumor in the right deep gray matter with extensive vasogenic edema and possible lesion in the right cerebellum. Case was discussed with  neurosurgery.  Patient was then initiated on dexamethasone and Keppra and was admitted to the hospital for further evaluation and treatment.   During hospitalization, patient has been followed by neurosurgery.  CT scan of the brain brain protocol showed enhancing lesions in the brain.  CT scan of the chest abdomen and pelvis done 05/15/2022 without any evidence of lymphadenopathy.  She underwent a stereotactic brain biopsy with Dr. Amado Coe on 05/16/2022 and CT of the head was done yesterday due to increased lethargy which was unremarkable.  Overall she is currently stable though her prognosis is extremely poor.  Neurosurgery felt that her strength is slightly improved and current plan is to have neuro-oncology see the patient as well as continuing Keppra 500 g p.o. twice daily and likely planning for inpatient rehab if she is a candidate.  Despite aggressive steroid dosing she continues to have periods of lethargy, confusion and hemiparesis and she is not able to stand, toilet or feed herself at this time and nor oncology evaluated and given that her functional status remains poor they are recommending a palliative care consult to help further with goals of care and disposition at this time but if her clinical status improves they can consider proceeding with staging and treatment planning.  Currently awaiting palliative care  discussions current plan is to have family meeting today at 1 PM.  Assessment and plan Principal Problem:   Brain tumor [facility]) Active Problems:   Hypertension   Dyslipidemia   Hypokalemia   Multifocal Brain tumors with associated vasogenic edema and left sided weakness -Neurosurgery on board.   -Patient had presented with generalized weakness, fatigue, difficulty balancing herself as well as falls with left upper extremity deficit. -MRI shows multifocal somewhat infiltrative and hypercellular enhancing brain masses involving both cerebral hemispheres and brainstem and citrate cerebellum.  Possibility of CNS lymphoma.  -Extensive brain and brainstem edema with midline shift of 7 mm was reported.   -Continuing Dexamethasone IV 6 mg every 6 and levetiracetam 1000 mg IV every 12 as well as carbamazepine 200 mg p.o. twice daily.   -CT  brain lab protocol was then performed which showed multiple brightly enhancing mass lesions throughout the infratentorial and supratentorial brain most consistent with CNS lymphoma with vasogenic edema.   -Patient subsequently underwent stereotactic brain biopsy per neurosurgery, Dr. Marcello Moores, 05/16/2022. 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 -Repeat head CT scan without contrast on 05/22/2022 showed "Marked motion degradation. No additional complication evident following biopsy of the inferior basal ganglia lesion on the right. Maximal measurement today is 2.6 cm. The study is markedly  regarded by motion and assessment for subtle change is not  possible. Regional edema on the right causes mass effect with right-to-left shift estimated at 7 mm, similar to the prior exam.  No visible edema in the inferior cerebellum on the right or in the left temporal lobe." -CT scan of the chest  abdomen and pelvis with no findings of lymphadenopathy to suggest lymphoma. We will follow neurosurgery recommendations. -Continue seizure precautions -She  has some left-sided weakness and contractures and deficits -Neurooncology evaluated and unfortunately her status remains poor despite high-dose corticosteroids and she is not independent with ADLs at this time.  Neurooncology recommends palliative care consultation and continuing steroids and supportive care but if her clinical status improves then they can consider proceeding with staging and treatment but overall the prognosis has been discussed with the patient's daughter by the neuro oncologist. -Follow Neurosurgery and Neuro-Oncology Recc's -Palliative care has been consulted for further goals of care discussion given that her prognosis is extremely poor and that she has not made very much progress at all but today she was much more awake and alert   Hyperlipidemia. -Continue with Simvastatin 20 mg p.o. daily  Mild Hypokalemia. -Improved.  Patient's potassium is now 4.0 -Continue monitoring for the necessary -Mag level is now 2.1 -Continue to monitor and trend and repeat CMP in a.m.  Essential Hypertension. -Continue to hold antihypertensives for now.   -Continue to monitor blood pressure protocol -Patient is on HCTZ and amlodipine at home. -Last blood pressure reading was 142/72  History of Trigeminal Neuralgia.   -Patient takes carbamazepine at home and this has been resumed  Leukocytosis -In the setting of steroid demargination given that she is on dexamethasone -WBC went from 7.4 -> 11.3 -> 12.4 -> 12.6 -> 13.4 Continue to monitor for signs and symptoms of infection; no overt infection noted -Repeat CBC in a.m.  Elevated ALT -Mild.  Patient's ALT went from 43 -> 55 -> 57 -Continue to monitor and trend and repeat CMP in a.m.  Hypoalbuminemia -Patient's Albumin Level went from 3.2 -> 3.0 -> 3.1 -> 3.4 -Continue to Monitor and Trend -Repeat CMP in the AM   Hyperglycemia -In the setting of steroid demargination -Continue monitor and trend CBGs and they have been ranging  from 112-132 daily on BMP/CMP and CBGs ranging from 114-136 on fingersticks -If necessary will place on sensitive NovoLog cycle insulin   DVT prophylaxis: Place and maintain sequential compression device Start: 05/17/22 0749 SCDs Start: 05/16/22 1718 SCDs Start: 05/13/22 1051    Code Status: Full Code Family Communication: Discussed with daughter at bedside  Disposition Plan:  Level of care: Progressive Status is: Inpatient Remains inpatient appropriate because: Needs some form of rehabilitation or other CIR versus SNF and needs further goals of care discussion with palliative   Consultants:  Neurosurgery Neuro-oncology  Procedures:  CT head 05/13/2022, 05/17/2022 CT chest abdomen and pelvis 05/15/2022 MRI brain 05/13/2022 CT Brain[facility] head 05/14/2022 Stereotactic brain biopsy application of cranial navigation procedure note per Dr. Marcello Moores neurosurgery 05/16/2022.  Antimicrobials:  Anti-infectives (From admission, onward)    Start     Dose/Rate Route Frequency Ordered Stop   05/16/22 2000  ceFAZolin (ANCEF) IVPB 1 g/50 mL premix        1 g 100 mL/hr over 30 Minutes Intravenous Every 8 hours 05/16/22 1717 05/17/22 1445   05/16/22 0745  ceFAZolin (ANCEF) IVPB 2g/100 mL premix        2 g 200 mL/hr over 30 Minutes Intravenous On call to O.R. 05/16/22 6283 05/16/22 1210       Subjective: Seen and examined at bedside and she is much more awake and alert today compared to yesterday.  Daughter states that she ate all her breakfast.  Currently awaiting palliative care goals of care discussion meeting with the family at 36  PM.  No nausea or vomiting.  No other concerns or complaints this time.  Objective: Vitals:   05/25/22 2300 05/26/22 0258 05/26/22 0748 05/26/22 1121  BP: (!) 156/76 (!) 154/60 (!) 142/72 (!) 142/72  Pulse: 61 (!) 54 63 63  Resp: '19 10 17 14  '$ Temp: 98.4 F (36.9 C) 98.3 F (36.8 C) 98 F (36.7 C) 97.7 F (36.5 C)  TempSrc: Oral Oral Oral Oral  SpO2: 97% 97% 100% 100%   Weight:      Height:        Intake/Output Summary (Last 24 hours) at 05/26/2022 1416 Last data filed at 05/26/2022 0915 Gross per 24 hour  Intake 480 ml  Output 950 ml  Net -470 ml   Filed Weights   05/23/22 0355 05/24/22 0305 05/25/22 0500  Weight: 62.5 kg 62.7 kg 61 kg   Examination: Physical Exam:  Constitutional: WN/WD African-American female currently no acute distress appears calm Respiratory: Diminished to auscultation bilaterally with some coarse breath sounds, no wheezing, rales, rhonchi or crackles. Normal respiratory effort and patient is not tachypenic. No accessory muscle use.  Cardiovascular: RRR, no murmurs / rubs / gallops. S1 and S2 auscultated. No extremity edema.  Abdomen: Soft, non-tender, non-distended. Bowel sounds positive.  GU: Deferred. Musculoskeletal: No clubbing / cyanosis of digits/nails. No joint deformity upper and lower extremities.  Neurologic: She has left-sided deficits as it is flaccid and some slight contractures Psychiatric: She is a bit more awake and alert today  Data Reviewed: I have personally reviewed following labs and imaging studies  CBC: Recent Labs  Lab 05/20/22 0332 05/21/22 0329 05/22/22 0807 05/23/22 0056 05/24/22 0328 05/25/22 0336 05/26/22 0231  WBC 11.1*   < > 7.4 11.3* 12.4* 12.6* 13.4*  NEUTROABS 9.5*  --  5.7  --  11.0* 10.9* 12.1*  HGB 13.4   < > 12.6 13.1 12.1 13.2 13.9  HCT 40.8   < > 38.2 38.7 35.5* 38.9 41.6  MCV 80.6   < > 79.6* 79.5* 78.4* 78.6* 79.7*  PLT 247   < > 195 209 212 163 242   < > = values in this interval not displayed.   Basic Metabolic Panel: Recent Labs  Lab 05/22/22 0807 05/23/22 0056 05/24/22 0328 05/25/22 0336 05/26/22 0231  NA 137 138 137 136 136  K 3.9 3.9 3.8 4.2 4.0  CL 104 105 104 100 99  CO2 '24 23 24 26 27  '$ GLUCOSE 112* 114* 132* 124* 138*  BUN '18 15 12 17 16  '$ CREATININE 0.77 0.90 0.67 0.73 0.76  CALCIUM 8.8* 8.8* 9.0 8.9 9.3  MG  --  2.1 1.9 1.9 2.1  PHOS  --  2.7  2.9 3.2 3.5   GFR: Estimated Creatinine Clearance: 58 mL/min (by C-G formula based on SCr of 0.76 mg/dL). Liver Function Tests: Recent Labs  Lab 05/23/22 0056 05/24/22 0328 05/25/22 0336 05/26/22 0231  AST  --  27 40 28  ALT  --  43 55* 57*  ALKPHOS  --  42 44 50  BILITOT  --  0.7 1.0 0.4  PROT  --  5.5* 5.5* 6.4*  ALBUMIN 3.2* 3.0* 3.1* 3.4*   No results for input(s): "LIPASE", "AMYLASE" in the last 168 hours. No results for input(s): "AMMONIA" in the last 168 hours. Coagulation Profile: No results for input(s): "INR", "PROTIME" in the last 168 hours. Cardiac Enzymes: No results for input(s): "CKTOTAL", "CKMB", "CKMBINDEX", "TROPONINI" in the last 168 hours. BNP (last 3 results) No results  for input(s): "PROBNP" in the last 8760 hours. HbA1C: No results for input(s): "HGBA1C" in the last 72 hours. CBG: Recent Labs  Lab 05/22/22 0728 05/23/22 0747 05/24/22 0742 05/25/22 0739 05/26/22 0745  GLUCAP 118* 123* 136* 114* 120*   Lipid Profile: No results for input(s): "CHOL", "HDL", "LDLCALC", "TRIG", "CHOLHDL", "LDLDIRECT" in the last 72 hours. Thyroid Function Tests: No results for input(s): "TSH", "T4TOTAL", "FREET4", "T3FREE", "THYROIDAB" in the last 72 hours. Anemia Panel: No results for input(s): "VITAMINB12", "FOLATE", "FERRITIN", "TIBC", "IRON", "RETICCTPCT" in the last 72 hours. Sepsis Labs: No results for input(s): "PROCALCITON", "LATICACIDVEN" in the last 168 hours.  No results found for this or any previous visit (from the past 240 hour(s)).   Radiology Studies: No results found.   Scheduled Meds:  amLODipine  2.5 mg Oral Daily   carbamazepine  200 mg Oral BID   dexamethasone (DECADRON) injection  6 mg Intravenous Q6H   levETIRAcetam  500 mg Oral BID   pantoprazole  40 mg Oral Daily   simvastatin  20 mg Oral Daily   Continuous Infusions:   LOS: 13 days   Raiford Noble, DO [facility] Available via Epic secure chat 7am-7pm After these  hours, please refer to coverage provider listed on [email or web address] 05/26/2022, 2:16 PM

## 2022-05-27 DIAGNOSIS — E785 Hyperlipidemia, unspecified: Secondary | ICD-10-CM | POA: Diagnosis not present

## 2022-05-27 DIAGNOSIS — D496 Neoplasm of unspecified behavior of brain: Secondary | ICD-10-CM | POA: Diagnosis not present

## 2022-05-27 DIAGNOSIS — I10 Essential (primary) hypertension: Secondary | ICD-10-CM | POA: Diagnosis not present

## 2022-05-27 LAB — MAGNESIUM: Magnesium: 2.2 mg/dL (ref 1.7–2.4)

## 2022-05-27 LAB — CBC WITH DIFFERENTIAL/PLATELET
Abs Immature Granulocytes: 0.13 10*3/uL — ABNORMAL HIGH (ref 0.00–0.07)
Basophils Absolute: 0 10*3/uL (ref 0.0–0.1)
Basophils Relative: 0 %
Eosinophils Absolute: 0 10*3/uL (ref 0.0–0.5)
Eosinophils Relative: 0 %
HCT: 40.1 % (ref 36.0–46.0)
Hemoglobin: 14 g/dL (ref 12.0–15.0)
Immature Granulocytes: 1 %
Lymphocytes Relative: 8 %
Lymphs Abs: 1.2 10*3/uL (ref 0.7–4.0)
MCH: 26.8 pg (ref 26.0–34.0)
MCHC: 34.9 g/dL (ref 30.0–36.0)
MCV: 76.8 fL — ABNORMAL LOW (ref 80.0–100.0)
Monocytes Absolute: 0.9 10*3/uL (ref 0.1–1.0)
Monocytes Relative: 6 %
Neutro Abs: 13.6 10*3/uL — ABNORMAL HIGH (ref 1.7–7.7)
Neutrophils Relative %: 85 %
Platelets: 249 10*3/uL (ref 150–400)
RBC: 5.22 MIL/uL — ABNORMAL HIGH (ref 3.87–5.11)
RDW: 13.6 % (ref 11.5–15.5)
WBC: 15.8 10*3/uL — ABNORMAL HIGH (ref 4.0–10.5)
nRBC: 0 % (ref 0.0–0.2)

## 2022-05-27 LAB — COMPREHENSIVE METABOLIC PANEL
ALT: 60 U/L — ABNORMAL HIGH (ref 0–44)
AST: 25 U/L (ref 15–41)
Albumin: 3.5 g/dL (ref 3.5–5.0)
Alkaline Phosphatase: 51 U/L (ref 38–126)
Anion gap: 12 (ref 5–15)
BUN: 18 mg/dL (ref 8–23)
CO2: 25 mmol/L (ref 22–32)
Calcium: 9.3 mg/dL (ref 8.9–10.3)
Chloride: 97 mmol/L — ABNORMAL LOW (ref 98–111)
Creatinine, Ser: 0.71 mg/dL (ref 0.44–1.00)
GFR, Estimated: >60 mL/min (ref >60)
Glucose, Bld: 118 mg/dL — ABNORMAL HIGH (ref 70–99)
Potassium: 4.2 mmol/L (ref 3.5–5.1)
Sodium: 134 mmol/L — ABNORMAL LOW (ref 135–145)
Total Bilirubin: 0.5 mg/dL (ref 0.3–1.2)
Total Protein: 6.4 g/dL — ABNORMAL LOW (ref 6.5–8.1)

## 2022-05-27 LAB — PHOSPHORUS: Phosphorus: 3.1 mg/dL (ref 2.5–4.6)

## 2022-05-27 LAB — GLUCOSE, CAPILLARY: Glucose-Capillary: 130 mg/dL — ABNORMAL HIGH (ref 70–99)

## 2022-05-27 NOTE — Progress Notes (Signed)
PROGRESS NOTE    April Saunders  VFI:[id number] DOB: [birth year] DOA: 05/13/2022 PCP: Willene Hatchet, NP   Brief Narrative:  April Saunders is a 66 y.o. female with medical history significant of hypertension, hyperlipidemia, degenerative joint disease, obesity presented hospital with fatigue, weakness for 3 to 4 weeks with difficulty balancing himself  and falls.  In the ED, patient had left upper extremity deficit.  Further work-up in the ED showed tumor in the right deep gray matter with extensive vasogenic edema and possible lesion in the right cerebellum. Case was discussed with  neurosurgery.  Patient was then initiated on dexamethasone and Keppra and was admitted to the hospital for further evaluation and treatment.   During hospitalization, patient has been followed by neurosurgery.  CT scan of the brain brain protocol showed enhancing lesions in the brain.  CT scan of the chest abdomen and pelvis done 05/15/2022 without any evidence of lymphadenopathy.  She underwent a stereotactic brain biopsy with Dr. Amado Coe on 05/16/2022 and CT of the head was done yesterday due to increased lethargy which was unremarkable.  Overall she is currently stable though her prognosis is extremely poor.  Neurosurgery felt that her strength is slightly improved and current plan is to have neuro-oncology see the patient as well as continuing Keppra 500 g p.o. twice daily and likely planning for inpatient rehab if she is a candidate.  Despite aggressive steroid dosing she continues to have periods of lethargy, confusion and hemiparesis and she is not able to stand, toilet or feed herself at this time and nor oncology evaluated and given that her functional status remains poor they are recommending a palliative care consult to help further with goals of care and disposition at this time but if her clinical status improves they can consider proceeding with staging and treatment planning.  Palliative care met with the patient's  family and they want to continue the current plan of care for now and family is interested in pursuing CIR with the goal of improving patient's functional status and allowing her to begin chemotherapy versus radiation therapy.  Currently she remains a little withdrawn  Assessment and plan Principal Problem:   Brain tumor [facility]) Active Problems:   Hypertension   Dyslipidemia   Hypokalemia   Multifocal Brain tumors with associated vasogenic edema and left sided weakness -Neurosurgery on board.   -Patient had presented with generalized weakness, fatigue, difficulty balancing herself as well as falls with left upper extremity deficit. -MRI shows multifocal somewhat infiltrative and hypercellular enhancing brain masses involving both cerebral hemispheres and brainstem and citrate cerebellum.  Possibility of CNS lymphoma.  -Extensive brain and brainstem edema with midline shift of 7 mm was reported.   -Continuing Dexamethasone IV 6 mg every 6 and levetiracetam 1000 mg IV every 12 as well as carbamazepine 200 mg p.o. twice daily.   -CT  brain lab protocol was then performed which showed multiple brightly enhancing mass lesions throughout the infratentorial and supratentorial brain most consistent with CNS lymphoma with vasogenic edema.   -Patient subsequently underwent stereotactic brain biopsy per neurosurgery, Dr. Marcello Moores, 05/16/2022. 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 -Repeat head CT scan without contrast on 05/22/2022 showed "Marked motion degradation. No additional complication evident following biopsy of the inferior basal ganglia lesion on the right. Maximal measurement today is 2.6 cm. The study is markedly  regarded by motion and assessment for subtle change is not  possible. Regional edema on the right causes mass effect  with right-to-left shift estimated at 7 mm, similar to the prior exam.  No visible edema in the inferior cerebellum on the right or in  the left temporal lobe." -CT scan of the chest abdomen and pelvis with no findings of lymphadenopathy to suggest lymphoma. We will follow neurosurgery recommendations. -Continue seizure precautions -She has some left-sided weakness and contractures and deficits -Neurooncology evaluated and unfortunately her status remains poor despite high-dose corticosteroids and she is not independent with ADLs at this time.  Neurooncology recommends palliative care consultation and continuing steroids and supportive care but if her clinical status improves then they can consider proceeding with staging and treatment but overall the prognosis has been discussed with the patient's daughter by the neuro oncologist. -Follow Neurosurgery and Neuro-Oncology Recc's -Palliative care has been consulted for further goals of care discussion given that her prognosis is extremely poor and that she has not made very much progress but family continues to remain interested in pursuing CIR with a goal of improving the patient's functional status to allow her to get further worked up with staging and treatment   Hyperlipidemia. -Continue with Simvastatin 20 mg p.o. daily  Mild Hypokalemia. -Improved.  Patient's potassium is now 4.2 -Continue monitoring for the necessary -Mag level is now 2.2 -Continue to monitor and trend and repeat CMP in a.m.  Essential Hypertension. -Continue to hold antihypertensives for now.   -Continue to monitor blood pressure protocol -Patient is on HCTZ and amlodipine at home. -Last blood pressure reading was on the softer side at 100/62  History of Trigeminal Neuralgia.   -Patient takes carbamazepine at home and this has been resumed  Hyponatremia -Patient's sodium has dropped to 134 and is slightly low -Continue to monitor and trend and repeat CMP in a.m. and if necessary will place on gentle IV fluids  Leukocytosis -In the setting of steroid demargination given that she is on  dexamethasone -WBC went from 7.4 -> 11.3 -> 12.4 -> 12.6 -> 13.4 and is gone up to 15.8 Continue to monitor for signs and symptoms of infection; no overt infection noted -Repeat CBC in a.m.  Elevated ALT -Mild.  Patient's ALT went from 43 -> 55 -> 57 and slowly trended up to 60 -Continue to monitor and trend and repeat CMP in a.m.  Hypoalbuminemia -Patient's Albumin Level went from 3.2 -> 3.0 -> 3.1 -> 3.4 and today is 3.5 -Continue to Monitor and Trend -Repeat CMP in the AM   Hyperglycemia -In the setting of steroid demargination -Continue monitor and trend CBGs and they have been ranging from 112-132 daily on BMP/CMP and CBGs ranging from 114-136 on fingersticks -If necessary will place on sensitive NovoLog cycle insulin  DVT prophylaxis: Place and maintain sequential compression device Start: 05/17/22 0749 SCDs Start: 05/16/22 1718 SCDs Start: 05/13/22 1051    Code Status: Full Code Family Communication: No family currently at bedside  Disposition Plan:  Level of care: Progressive Status is: Inpatient Remains inpatient appropriate because: Needs CIR versus SNF to improve her functional status.  Currently and has not made much progress and remains medically stable   Consultants:  Neurosurgery Neuro-oncology Palliative care medicine  Procedures:  CT head 05/13/2022, 05/17/2022 CT chest abdomen and pelvis 05/15/2022 MRI brain 05/13/2022 CT BrainLab head 05/14/2022 Stereotactic brain biopsy application of cranial navigation procedure note per Dr. Marcello Moores neurosurgery 05/16/2022  Antimicrobials:  Anti-infectives (From admission, onward)    Start     Dose/Rate Route Frequency Ordered Stop   05/16/22 2000  ceFAZolin (ANCEF)  IVPB 1 g/50 mL premix        1 g 100 mL/hr over 30 Minutes Intravenous Every 8 hours 05/16/22 1717 05/17/22 1445   05/16/22 0745  ceFAZolin (ANCEF) IVPB 2g/100 mL premix        2 g 200 mL/hr over 30 Minutes Intravenous On call to O.R. 05/16/22 1779 05/16/22 1210        Subjective: Seen and examined at bedside and she is more withdrawn today and wanting to sleep.  She answers yes and no questions but does not acknowledge me and does not want to open her eyes because she wants to rest.  After goals of care discussion this still and appears to be therapy for trying to improve her functional status.  She denies any nausea or vomiting denies any other concerns or complaints at this time.  Objective: Vitals:   05/26/22 2321 05/27/22 0321 05/27/22 0500 05/27/22 0706  BP: (!) 142/82 (!) 174/75  100/62  Pulse: (!) 54 64  (!) 50  Resp: 19 14  16   Temp: 98.6 F (37 C) 98 F (36.7 C)    TempSrc: Oral Oral    SpO2: 100% 94%  100%  Weight:   61 kg   Height:        Intake/Output Summary (Last 24 hours) at 05/27/2022 1042 Last data filed at 05/26/2022 2347 Gross per 24 hour  Intake 240 ml  Output 650 ml  Net -410 ml   Filed Weights   05/24/22 0305 05/25/22 0500 05/27/22 0500  Weight: 62.7 kg 61 kg 61 kg    Examination: Physical Exam:  Constitutional: WN/WD African-American female currently no acute distress appears calm and somnolent and drowsy wanting to sleep Respiratory: Diminished to auscultation bilaterally with slightly coarse breath sounds, no wheezing, rales, rhonchi or crackles. Normal respiratory effort and patient is not tachypenic. No accessory muscle use.  Unlabored breathing and not on any supplemental oxygen via nasal cannula Cardiovascular: RRR, no murmurs / rubs / gallops. S1 and S2 auscultated. No extremity edema.  Abdomen: Soft, non-tender, non-distended.  Bowel sounds positive.  GU: Deferred. Musculoskeletal: No clubbing / cyanosis of digits/nails. No joint deformity upper and lower extremities. Skin: No rashes, lesions, ulcers on limited skin evaluation. No induration; Warm and dry.  Neurologic: She continues to have some left-sided deficits and left-sided flaccid and has some slight contractures Psychiatric: Impaired judgment  and insight and she is more withdrawn and somnolent and only nods yes or no to my questioning  Data Reviewed: I have personally reviewed following labs and imaging studies  CBC: Recent Labs  Lab 05/22/22 0807 05/23/22 0056 05/24/22 0328 05/25/22 0336 05/26/22 0231 05/27/22 0248  WBC 7.4 11.3* 12.4* 12.6* 13.4* 15.8*  NEUTROABS 5.7  --  11.0* 10.9* 12.1* 13.6*  HGB 12.6 13.1 12.1 13.2 13.9 14.0  HCT 38.2 38.7 35.5* 38.9 41.6 40.1  MCV 79.6* 79.5* 78.4* 78.6* 79.7* 76.8*  PLT 195 209 212 163 242 390   Basic Metabolic Panel: Recent Labs  Lab 05/23/22 0056 05/24/22 0328 05/25/22 0336 05/26/22 0231 05/27/22 0248  NA 138 137 136 136 134*  K 3.9 3.8 4.2 4.0 4.2  CL 105 104 100 99 97*  CO2 23 24 26 27 25   GLUCOSE 114* 132* 124* 138* 118*  BUN 15 12 17 16 18   CREATININE 0.90 0.67 0.73 0.76 0.71  CALCIUM 8.8* 9.0 8.9 9.3 9.3  MG 2.1 1.9 1.9 2.1 2.2  PHOS 2.7 2.9 3.2 3.5 3.1   GFR:  Estimated Creatinine Clearance: 58 mL/min (by C-G formula based on SCr of 0.71 mg/dL). Liver Function Tests: Recent Labs  Lab 05/23/22 0056 05/24/22 0328 05/25/22 0336 05/26/22 0231 05/27/22 0248  AST  --  27 40 28 25  ALT  --  43 55* 57* 60*  ALKPHOS  --  42 44 50 51  BILITOT  --  0.7 1.0 0.4 0.5  PROT  --  5.5* 5.5* 6.4* 6.4*  ALBUMIN 3.2* 3.0* 3.1* 3.4* 3.5   No results for input(s): "LIPASE", "AMYLASE" in the last 168 hours. No results for input(s): "AMMONIA" in the last 168 hours. Coagulation Profile: No results for input(s): "INR", "PROTIME" in the last 168 hours. Cardiac Enzymes: No results for input(s): "CKTOTAL", "CKMB", "CKMBINDEX", "TROPONINI" in the last 168 hours. BNP (last 3 results) No results for input(s): "PROBNP" in the last 8760 hours. HbA1C: No results for input(s): "HGBA1C" in the last 72 hours. CBG: Recent Labs  Lab 05/23/22 0747 05/24/22 0742 05/25/22 0739 05/26/22 0745 05/27/22 0751  GLUCAP 123* 136* 114* 120* 130*   Lipid Profile: No results for  input(s): "CHOL", "HDL", "LDLCALC", "TRIG", "CHOLHDL", "LDLDIRECT" in the last 72 hours. Thyroid Function Tests: No results for input(s): "TSH", "T4TOTAL", "FREET4", "T3FREE", "THYROIDAB" in the last 72 hours. Anemia Panel: No results for input(s): "VITAMINB12", "FOLATE", "FERRITIN", "TIBC", "IRON", "RETICCTPCT" in the last 72 hours. Sepsis Labs: No results for input(s): "PROCALCITON", "LATICACIDVEN" in the last 168 hours.  No results found for this or any previous visit (from the past 240 hour(s)).   Radiology Studies: No results found.   Scheduled Meds:  amLODipine  2.5 mg Oral Daily   carbamazepine  200 mg Oral BID   dexamethasone (DECADRON) injection  6 mg Intravenous Q6H   levETIRAcetam  500 mg Oral BID   pantoprazole  40 mg Oral Daily   simvastatin  20 mg Oral Daily   Continuous Infusions:   LOS: 14 days   Raiford Noble, DO [facility] Available via Epic secure chat 7am-7pm After these hours, please refer to coverage provider listed on [email or web address] 05/27/2022, 10:42 AM

## 2022-05-28 ENCOUNTER — Encounter (HOSPITAL_COMMUNITY): Payer: Self-pay

## 2022-05-28 DIAGNOSIS — D496 Neoplasm of unspecified behavior of brain: Secondary | ICD-10-CM | POA: Diagnosis not present

## 2022-05-28 DIAGNOSIS — E785 Hyperlipidemia, unspecified: Secondary | ICD-10-CM | POA: Diagnosis not present

## 2022-05-28 DIAGNOSIS — I10 Essential (primary) hypertension: Secondary | ICD-10-CM | POA: Diagnosis not present

## 2022-05-28 LAB — COMPREHENSIVE METABOLIC PANEL
ALT: 64 U/L — ABNORMAL HIGH (ref 0–44)
AST: 26 U/L (ref 15–41)
Albumin: 3.5 g/dL (ref 3.5–5.0)
Alkaline Phosphatase: 55 U/L (ref 38–126)
Anion gap: 11 (ref 5–15)
BUN: 15 mg/dL (ref 8–23)
CO2: 26 mmol/L (ref 22–32)
Calcium: 9.6 mg/dL (ref 8.9–10.3)
Chloride: 96 mmol/L — ABNORMAL LOW (ref 98–111)
Creatinine, Ser: 0.66 mg/dL (ref 0.44–1.00)
GFR, Estimated: >60 mL/min (ref >60)
Glucose, Bld: 127 mg/dL — ABNORMAL HIGH (ref 70–99)
Potassium: 4.2 mmol/L (ref 3.5–5.1)
Sodium: 133 mmol/L — ABNORMAL LOW (ref 135–145)
Total Bilirubin: 0.7 mg/dL (ref 0.3–1.2)
Total Protein: 6.6 g/dL (ref 6.5–8.1)

## 2022-05-28 LAB — CBC WITH DIFFERENTIAL/PLATELET
Abs Immature Granulocytes: 0.11 10*3/uL — ABNORMAL HIGH (ref 0.00–0.07)
Basophils Absolute: 0 10*3/uL (ref 0.0–0.1)
Basophils Relative: 0 %
Eosinophils Absolute: 0 10*3/uL (ref 0.0–0.5)
Eosinophils Relative: 0 %
HCT: 43.7 % (ref 36.0–46.0)
Hemoglobin: 14.7 g/dL (ref 12.0–15.0)
Immature Granulocytes: 1 %
Lymphocytes Relative: 6 %
Lymphs Abs: 0.9 10*3/uL (ref 0.7–4.0)
MCH: 26.5 pg (ref 26.0–34.0)
MCHC: 33.6 g/dL (ref 30.0–36.0)
MCV: 78.9 fL — ABNORMAL LOW (ref 80.0–100.0)
Monocytes Absolute: 0.7 10*3/uL (ref 0.1–1.0)
Monocytes Relative: 5 %
Neutro Abs: 13.6 10*3/uL — ABNORMAL HIGH (ref 1.7–7.7)
Neutrophils Relative %: 88 %
Platelets: 265 10*3/uL (ref 150–400)
RBC: 5.54 MIL/uL — ABNORMAL HIGH (ref 3.87–5.11)
RDW: 13.7 % (ref 11.5–15.5)
WBC: 15.3 10*3/uL — ABNORMAL HIGH (ref 4.0–10.5)
nRBC: 0 % (ref 0.0–0.2)

## 2022-05-28 LAB — PHOSPHORUS: Phosphorus: 3.4 mg/dL (ref 2.5–4.6)

## 2022-05-28 LAB — GLUCOSE, CAPILLARY
Glucose-Capillary: 130 mg/dL — ABNORMAL HIGH (ref 70–99)
Glucose-Capillary: 128 mg/dL — ABNORMAL HIGH (ref 70–99)

## 2022-05-28 LAB — MAGNESIUM: Magnesium: 2.3 mg/dL (ref 1.7–2.4)

## 2022-05-28 NOTE — Progress Notes (Signed)
Physical Therapy Treatment Patient Details Name: April Saunders MRN: [id number] DOB: 09/28/1956 Today's Date: 05/28/2022   History of Present Illness The pt is a 66 yo female presenting 7/2 with increased weakness and falls in last 3 weeks. CT revealed a 2.5 cm hypercellular mass with extensive vasogenic edema centered at the right deep gray nuclei with possible 2nd lesion in the right cerebellum. Pt now s/p stereotactic brain biopsy on 7/5. PMH includes: DJD, HLD, HTN, and trigenimal neuralgia.    PT Comments    The pt is agreeable to session, but requires max cues and redirection due to high levels of restlessness and distractibility.  The pt was able to complete bed mobility with max cues and modA, but requires minG-modA to maintain sitting balance due to frequent LOB in all directions without continued cues to sit upright. The pt completed x3 sit-stand transfers for pericare and LE strengthening, stands with narrow BOS and requires increased assist and cues to improve posture and positioning in standing. Pt typically maintains L lateral lean, L knee in flexion, and narrow BOS. She continues to need wt shift facilitation to advance LLE for gait, remains limited at this time by fatigue and complaints of being too cold to continue.    Recommendations for follow up therapy are one component of a multi-disciplinary discharge planning process, led by the attending physician.  Recommendations may be updated based on patient status, additional functional criteria and insurance authorization.  Follow Up Recommendations  Acute inpatient rehab (3hours/day)     Assistance Recommended at Discharge Frequent or constant Supervision/Assistance  Patient can return home with the following A lot of help with walking and/or transfers;A lot of help with bathing/dressing/bathroom;Assistance with cooking/housework;Assistance with feeding;Direct supervision/assist for medications management;Direct supervision/assist  for financial management;Assist for transportation;Help with stairs or ramp for entrance   Equipment Recommendations  Wheelchair (measurements PT);Wheelchair cushion (measurements PT)    Recommendations for Other Services       Precautions / Restrictions Precautions Precautions: Fall Precaution Comments: incontinent of urine Restrictions Weight Bearing Restrictions: No     Mobility  Bed Mobility Overal bed mobility: Needs Assistance Bed Mobility: Supine to Sit, Sit to Supine     Supine to sit: Mod assist Sit to supine: Mod assist   General bed mobility comments: modA to initiate and complete trunk elevation but pt able to assist with LE and pull on therapist for trunk elevation, pt assisting to elevate LE to return to bed. also looked to L with max cues to seek out pillow to lay down trunk    Transfers Overall transfer level: Needs assistance Equipment used: 1 person hand held assist Transfers: Sit to/from Stand Sit to Stand: Mod assist, Max assist           General transfer comment: mod-maxA depending on cues given. with max cues, pt able to assist more and complete with modA. with slightly less cues, pt falling back and does not correct or initiate standing    Ambulation/Gait Ambulation/Gait assistance: Max assist Gait Distance (Feet): 4 Feet Assistive device: 1 person hand held assist Gait Pattern/deviations: Step-to pattern, Step-through pattern, Decreased stride length, Decreased weight shift to left, Knee flexed in stance - left, Knees buckling, Shuffle       General Gait Details: pt with poor coordination of movement of LLE, maxA to maintain balance, block L knee in stance, and facilitate wt shift to clear LLE.      Balance Overall balance assessment: Needs assistance Sitting-balance support: Single extremity supported,  Feet supported Sitting balance-Leahy Scale: Poor     Standing balance support: Bilateral upper extremity supported Standing  balance-Leahy Scale: Zero Standing balance comment: dependent on UE support and mod-maxA                            Cognition Arousal/Alertness: Lethargic, Awake/alert Behavior During Therapy: Flat affect, Restless Overall Cognitive Status: Difficult to assess Area of Impairment: Orientation, Attention, Memory, Following commands, Safety/judgement, Awareness, Problem solving                 Orientation Level: Disoriented to, Situation, Place Current Attention Level: Focused Memory: Decreased short-term memory Following Commands: Follows one step commands inconsistently Safety/Judgement: Decreased awareness of safety, Decreased awareness of deficits Awareness: Intellectual Problem Solving: Slow processing, Decreased initiation, Difficulty sequencing, Requires verbal cues, Requires tactile cues General Comments: pt following simple comands, remains restless and difficult to redirect throgh session. Pt easily distracted by cold, reaching for soiled blankets to cover herself in the session           General Comments General comments (skin integrity, edema, etc.): VSS on RA, pt soiled in urine, RN present to assist wtih changing of linens and purewick      Pertinent Vitals/Pain Pain Assessment Faces Pain Scale: Hurts little more Pain Location: L hip Pain Descriptors / Indicators: Grimacing, Discomfort Pain Intervention(s): Limited activity within patient's tolerance, Monitored during session, Repositioned     PT Goals (current goals can now be found in the care plan section) Acute Rehab PT Goals Patient Stated Goal: none stated PT Goal Formulation: Patient unable to participate in goal setting Time For Goal Achievement: 06/02/22 Potential to Achieve Goals: Fair Progress towards PT goals: Progressing toward goals    Frequency    Min 4X/week      PT Plan Current plan remains appropriate       AM-PAC PT "6 Clicks" Mobility   Outcome Measure  Help  needed turning from your back to your side while in a flat bed without using bedrails?: A Lot Help needed moving from lying on your back to sitting on the side of a flat bed without using bedrails?: A Lot Help needed moving to and from a bed to a chair (including a wheelchair)?: Total Help needed standing up from a chair using your arms (e.g., wheelchair or bedside chair)?: Total Help needed to walk in hospital room?: Total Help needed climbing 3-5 steps with a railing? : Total 6 Click Score: 8    End of Session Equipment Utilized During Treatment: Gait belt Activity Tolerance: Patient limited by lethargy Patient left: in bed;with bed alarm set;with call bell/phone within reach;with family/visitor present;with nursing/sitter in room Nurse Communication: Mobility status PT Visit Diagnosis: Other abnormalities of gait and mobility (R26.89);Muscle weakness (generalized) (M62.81);Difficulty in walking, not elsewhere classified (R26.2)     Time: 1308-6578 PT Time Calculation (min) (ACUTE ONLY): 23 min  Charges:  $Therapeutic Activity: 23-37 mins                     West Carbo, PT, DPT   Acute Rehabilitation Department   Sandra Cockayne 05/28/2022, 1:45 PM

## 2022-05-28 NOTE — Progress Notes (Signed)
PROGRESS NOTE    Madge Therrien  JDB:[id number] DOB: 09/10/1956 DOA: 05/13/2022 PCP: Willene Hatchet, NP   Brief Narrative:  April Saunders is a 66 y.o. female with medical history significant of hypertension, hyperlipidemia, degenerative joint disease, obesity presented hospital with fatigue, weakness for 3 to 4 weeks with difficulty balancing himself  and falls.  In the ED, patient had left upper extremity deficit.  Further work-up in the ED showed tumor in the right deep gray matter with extensive vasogenic edema and possible lesion in the right cerebellum. Case was discussed with  neurosurgery.  Patient was then initiated on dexamethasone and Keppra and was admitted to the hospital for further evaluation and treatment.   During hospitalization, patient has been followed by neurosurgery.  CT scan of the brain brain protocol showed enhancing lesions in the brain.  CT scan of the chest abdomen and pelvis done 05/15/2022 without any evidence of lymphadenopathy.  She underwent a stereotactic brain biopsy with Dr. Marcello Moores on 05/16/2022 and CT of the head was done yesterday due to increased lethargy which was unremarkable.  Overall she is currently stable though her prognosis is extremely poor.  Neurosurgery felt that her strength is slightly improved and current plan is to have neuro-oncology see the patient as well as continuing Keppra 500 g p.o. twice daily and likely planning for inpatient rehab if she is a candidate.  Despite aggressive steroid dosing she continues to have periods of lethargy, confusion and hemiparesis and she is not able to stand, toilet or feed herself at this time and nor oncology evaluated and given that her functional status remains poor they are recommending a palliative care consult to help further with goals of care and disposition at this time but if her clinical status improves they can consider proceeding with staging and treatment planning.  Palliative care met with the patient's  family and they want to continue the current plan of care for now and family is interested in pursuing CIR with the goal of improving patient's functional status and allowing her to begin chemotherapy versus radiation therapy.  Currently she remains a little withdrawn.  CIR evaluated and unfortunately the family is unable to provide 24/7 care after a CIR admit so now recommendation is for SNF. TOC assisting with Disposition.   Assessment and plan Principal Problem:   Brain tumor [facility]) Active Problems:   Hypertension   Dyslipidemia   Hypokalemia   Multifocal Brain tumors with associated vasogenic edema and left sided weakness -Neurosurgery on board.   -Patient had presented with generalized weakness, fatigue, difficulty balancing herself as well as falls with left upper extremity deficit. -MRI shows multifocal somewhat infiltrative and hypercellular enhancing brain masses involving both cerebral hemispheres and brainstem and citrate cerebellum.  Possibility of CNS lymphoma.  -Extensive brain and brainstem edema with midline shift of 7 mm was reported.   -Continuing Dexamethasone IV 6 mg every 6 and levetiracetam 1000 mg IV every 12 as well as carbamazepine 200 mg p.o. twice daily.   -CT  brain lab protocol was then performed which showed multiple brightly enhancing mass lesions throughout the infratentorial and supratentorial brain most consistent with CNS lymphoma with vasogenic edema.   -Patient subsequently underwent stereotactic brain biopsy per neurosurgery, Dr. Marcello Moores, 05/16/2022. 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 -Repeat head CT scan without contrast on 05/22/2022 showed "Marked motion degradation. No additional complication evident following biopsy of the inferior basal ganglia lesion on the right. Maximal measurement today  is 2.6 cm. The study is markedly  regarded by motion and assessment for subtle change is not  possible. Regional edema on the  right causes mass effect with right-to-left shift estimated at 7 mm, similar to the prior exam.  No visible edema in the inferior cerebellum on the right or in the left temporal lobe." -CT scan of the chest abdomen and pelvis with no findings of lymphadenopathy to suggest lymphoma. We will follow neurosurgery recommendations. -Continue seizure precautions -She has some left-sided weakness and contractures and deficits -Neurooncology evaluated and unfortunately her status remains poor despite high-dose corticosteroids and she is not independent with ADLs at this time.  Neurooncology recommends palliative care consultation and continuing steroids and supportive care but if her clinical status improves then they can consider proceeding with staging and treatment but overall the prognosis has been discussed with the patient's daughter by the neuro oncologist. -Follow Neurosurgery and Neuro-Oncology Recc's -Palliative care has been consulted for further goals of care discussion given that her prognosis is extremely poor and that she has not made very much progress but family continues to remain interested in pursuing CIR with a goal of improving the patient's functional status to allow her to get further worked up with staging and treatment -PT and OT recommending CIR but patient unfortunately does not have 24-hour care after CIR admission still CIR has declined the patient and recommending the patient following for SNF   Hyperlipidemia. -Continue with Simvastatin 20 mg p.o. daily  Mild Hypokalemia. -Improved.  Patient's potassium is now 4.2 -Continue monitoring for the necessary -Mag level is now 2.3 -Continue to monitor and trend and repeat CMP in a.m.  Essential Hypertension. -Continue to hold antihypertensives for now.   -Continue to monitor blood pressure protocol -Patient is on HCTZ and amlodipine at home. -Last blood pressure reading was on the softer side at 100/62  History of Trigeminal  Neuralgia.   -Patient takes carbamazepine at home and this has been resumed  Hyponatremia -Patient's sodium has dropped to 134 and is slightly low again and is 133 -Continue to monitor and trend and repeat CMP in a.m. and if necessary will place on gentle IV fluids  Leukocytosis -In the setting of steroid demargination given that she is on dexamethasone -WBC went from 7.4 -> 11.3 -> 12.4 -> 12.6 -> 13.4 -> 15.8 -> 15.3 Continue to monitor for signs and symptoms of infection; no overt infection noted -Repeat CBC in a.m.  Elevated ALT -Mild.  Patient's ALT went from 43 -> 55 -> 57 and slowly trended up to 60 and is now 64 and likley in the setting of Steroids  -Continue to monitor and trend and repeat CMP in a.m.  Hypoalbuminemia -Patient's Albumin Level went from 3.2 -> 3.0 -> 3.1 -> 3.4 and today is 3.5 again today  -Continue to Monitor and Trend -Repeat CMP in the AM   Hyperglycemia -In the setting of steroid demargination -Continue monitor and trend CBGs and they have been ranging from 112-132 daily on BMP/CMP and CBGs ranging from 114-136 on fingersticks -If necessary will place on sensitive NovoLog cycle insulin   DVT prophylaxis: Place and maintain sequential compression device Start: 05/17/22 0749 SCDs Start: 05/16/22 1718 SCDs Start: 05/13/22 1051    Code Status: Full Code Family Communication: No family currently at bedside  Disposition Plan:  Level of care: Progressive Status is: Inpatient Remains inpatient appropriate because: Needs further disposition planning and likely SNF given that she is no longer a CIR candidate  Consultants:  Neurosurgery Neuro-oncology Palliative care medicine  Procedures:  CT head 05/13/2022, 05/17/2022 CT chest abdomen and pelvis 05/15/2022 MRI brain 05/13/2022 CT BrainLab head 05/14/2022 Stereotactic brain biopsy application of cranial navigation procedure note per Dr. Marcello Moores neurosurgery 05/16/2022  Antimicrobials:  Anti-infectives  (From admission, onward)    Start     Dose/Rate Route Frequency Ordered Stop   05/16/22 2000  ceFAZolin (ANCEF) IVPB 1 g/50 mL premix        1 g 100 mL/hr over 30 Minutes Intravenous Every 8 hours 05/16/22 1717 05/17/22 1445   05/16/22 0745  ceFAZolin (ANCEF) IVPB 2g/100 mL premix        2 g 200 mL/hr over 30 Minutes Intravenous On call to O.R. 05/16/22 3220 05/16/22 1210       Subjective: Seen and examined at bedside and continues to remain withdrawn.  Has the covers over her head.  Did not really want to interact given that she wants to sleep.  No other concerns or complaints at this time.  Unfortunately she is not a CIR candidate she will need to pursue other avenues for rehabilitation including skilled nursing facility.   Objective: Vitals:   05/28/22 0303 05/28/22 0500 05/28/22 0800 05/28/22 1100  BP: (!) 153/75  (!) 142/77 134/74  Pulse: (!) 59     Resp: 16  20   Temp: 98.4 F (36.9 C)  98 F (36.7 C) 98.4 F (36.9 C)  TempSrc: Oral  Oral Oral  SpO2: 98%     Weight:  60.3 kg    Height:        Intake/Output Summary (Last 24 hours) at 05/28/2022 1538 Last data filed at 05/28/2022 0548 Gross per 24 hour  Intake 360 ml  Output 1250 ml  Net -890 ml   Filed Weights   05/25/22 0500 05/27/22 0500 05/28/22 0500  Weight: 61 kg 61 kg 60.3 kg   Examination: Physical Exam:  Constitutional: WN/WD African-American female, in no acute distress appears withdrawn and when asleep and somnolent Respiratory: Diminished to auscultation bilaterally with slightly coarse breath sounds, no wheezing, rales, rhonchi or crackles. Normal respiratory effort and patient is not tachypenic. No accessory muscle use.  Unlabored breathing and not on any supplemental oxygen via nasal cannula Cardiovascular: RRR, no murmurs / rubs / gallops. S1 and S2 auscultated. No extremity edema.  Abdomen: Soft, non-tender, non-distended. Bowel sounds positive.  GU: Deferred. Musculoskeletal: No clubbing /  cyanosis of digits/nails.  Continues to have some left-sided deficits had left-sided spots as noted Neurologic: Continues to have some left-sided deficits in the left side appears flaccid. Psychiatric: Impaired judgment and insight and is more withdrawn and wanting to rest and sleep  Data Reviewed: I have personally reviewed following labs and imaging studies  CBC: Recent Labs  Lab 05/24/22 0328 05/25/22 0336 05/26/22 0231 05/27/22 0248 05/28/22 0210  WBC 12.4* 12.6* 13.4* 15.8* 15.3*  NEUTROABS 11.0* 10.9* 12.1* 13.6* 13.6*  HGB 12.1 13.2 13.9 14.0 14.7  HCT 35.5* 38.9 41.6 40.1 43.7  MCV 78.4* 78.6* 79.7* 76.8* 78.9*  PLT 212 163 242 249 254   Basic Metabolic Panel: Recent Labs  Lab 05/24/22 0328 05/25/22 0336 05/26/22 0231 05/27/22 0248 05/28/22 0210  NA 137 136 136 134* 133*  K 3.8 4.2 4.0 4.2 4.2  CL 104 100 99 97* 96*  CO2 24 26 27 25 26   GLUCOSE 132* 124* 138* 118* 127*  BUN 12 17 16 18 15   CREATININE 0.67 0.73 0.76 0.71 0.66  CALCIUM  9.0 8.9 9.3 9.3 9.6  MG 1.9 1.9 2.1 2.2 2.3  PHOS 2.9 3.2 3.5 3.1 3.4   GFR: Estimated Creatinine Clearance: 58 mL/min (by C-G formula based on SCr of 0.66 mg/dL). Liver Function Tests: Recent Labs  Lab 05/24/22 0328 05/25/22 0336 05/26/22 0231 05/27/22 0248 05/28/22 0210  AST 27 40 28 25 26   ALT 43 55* 57* 60* 64*  ALKPHOS 42 44 50 51 55  BILITOT 0.7 1.0 0.4 0.5 0.7  PROT 5.5* 5.5* 6.4* 6.4* 6.6  ALBUMIN 3.0* 3.1* 3.4* 3.5 3.5   No results for input(s): "LIPASE", "AMYLASE" in the last 168 hours. No results for input(s): "AMMONIA" in the last 168 hours. Coagulation Profile: No results for input(s): "INR", "PROTIME" in the last 168 hours. Cardiac Enzymes: No results for input(s): "CKTOTAL", "CKMB", "CKMBINDEX", "TROPONINI" in the last 168 hours. BNP (last 3 results) No results for input(s): "PROBNP" in the last 8760 hours. HbA1C: No results for input(s): "HGBA1C" in the last 72 hours. CBG: Recent Labs  Lab  05/25/22 0739 05/26/22 0745 05/27/22 0751 05/28/22 0712 05/28/22 1211  GLUCAP 114* 120* 130* 130* 128*   Lipid Profile: No results for input(s): "CHOL", "HDL", "LDLCALC", "TRIG", "CHOLHDL", "LDLDIRECT" in the last 72 hours. Thyroid Function Tests: No results for input(s): "TSH", "T4TOTAL", "FREET4", "T3FREE", "THYROIDAB" in the last 72 hours. Anemia Panel: No results for input(s): "VITAMINB12", "FOLATE", "FERRITIN", "TIBC", "IRON", "RETICCTPCT" in the last 72 hours. Sepsis Labs: No results for input(s): "PROCALCITON", "LATICACIDVEN" in the last 168 hours.  No results found for this or any previous visit (from the past 240 hour(s)).   Radiology Studies: No results found.   Scheduled Meds:  amLODipine  2.5 mg Oral Daily   carbamazepine  200 mg Oral BID   dexamethasone (DECADRON) injection  6 mg Intravenous Q6H   levETIRAcetam  500 mg Oral BID   pantoprazole  40 mg Oral Daily   simvastatin  20 mg Oral Daily   Continuous Infusions:   LOS: 15 days   Raiford Noble, DO [facility] Available via Epic secure chat 7am-7pm After these hours, please refer to coverage provider listed on [email or web address] 05/28/2022, 3:38 PM

## 2022-05-28 NOTE — Plan of Care (Signed)
  Problem: Education: Goal: Knowledge of General Education information will improve Description: Including pain rating scale, medication(s)/side effects and non-pharmacologic comfort measures Outcome: Progressing   Problem: Health Behavior/Discharge Planning: Goal: Ability to manage health-related needs will improve Outcome: Progressing   Problem: Clinical Measurements: Goal: Ability to maintain clinical measurements within normal limits will improve Outcome: Progressing Goal: Will remain free from infection Outcome: Progressing Goal: Diagnostic test results will improve Outcome: Progressing Goal: Respiratory complications will improve Outcome: Progressing Goal: Cardiovascular complication will be avoided Outcome: Progressing   Problem: Activity: Goal: Risk for activity intolerance will decrease Outcome: Progressing   Problem: Nutrition: Goal: Adequate nutrition will be maintained Outcome: Progressing   Problem: Coping: Goal: Level of anxiety will decrease Outcome: Progressing   Problem: Elimination: Goal: Will not experience complications related to bowel motility Outcome: Progressing Goal: Will not experience complications related to urinary retention Outcome: Progressing   Problem: Pain Managment: Goal: General experience of comfort will improve Outcome: Progressing   Problem: Safety: Goal: Ability to remain free from injury will improve Outcome: Progressing   Problem: Skin Integrity: Goal: Risk for impaired skin integrity will decrease Outcome: Progressing   Problem: Education: Goal: Knowledge of condition and prescribed therapy will improve Outcome: Progressing   Problem: Cardiac: Goal: Will achieve and/or maintain adequate cardiac output Outcome: Progressing   Problem: Physical Regulation: Goal: Complications related to the disease process, condition or treatment will be avoided or minimized Outcome: Progressing   Problem: Education: Goal:  Knowledge of the prescribed therapeutic regimen will improve Outcome: Progressing   Problem: Clinical Measurements: Goal: Usual level of consciousness will be regained or maintained. Outcome: Progressing Goal: Neurologic status will improve Outcome: Progressing Goal: Ability to maintain intracranial pressure will improve Outcome: Progressing   Problem: Skin Integrity: Goal: Demonstration of wound healing without infection will improve Outcome: Progressing   Problem: Education: Goal: Expressions of having a comfortable level of knowledge regarding the disease process will increase Outcome: Progressing   Problem: Coping: Goal: Ability to adjust to condition or change in health will improve Outcome: Progressing Goal: Ability to identify appropriate support needs will improve Outcome: Progressing   Problem: Health Behavior/Discharge Planning: Goal: Compliance with prescribed medication regimen will improve Outcome: Progressing   Problem: Medication: Goal: Risk for medication side effects will decrease Outcome: Progressing   Problem: Clinical Measurements: Goal: Complications related to the disease process, condition or treatment will be avoided or minimized Outcome: Progressing Goal: Diagnostic test results will improve Outcome: Progressing   Problem: Safety: Goal: Verbalization of understanding the information provided will improve Outcome: Progressing   Problem: Self-Concept: Goal: Level of anxiety will decrease Outcome: Progressing Goal: Ability to verbalize feelings about condition will improve Outcome: Progressing

## 2022-05-28 NOTE — TOC Initial Note (Signed)
Transition of Care [facility]) - Initial/Assessment Note    Patient Details  Name: April Saunders MRN: [id number] Date of Birth: 1956/06/16  Transition of Care [facility]) CM/SW Contact:    Vinie Sill, LCSW Phone Number: 05/28/2022, 4:33 PM  Clinical Narrative:            CSW informed patient family unable to provide 24/7 care needed for Inpatient rehab- family wants short term rehab at [facility]. CSW spoke with patient's daughter, April Saunders- she confirmed plan of short term rehab at [facility].  She reports family works during the day and is unable to provide need care for patient at this time. No preferred SNF choice.   TOC will provide bed offers once available TOC will continue to follow and assist with discharge planning.   Thurmond Butts, MSW, LCSW Clinical Social Worker        Expected Discharge Plan: Skilled Nursing Facility Barriers to Discharge: Ship broker, Continued Medical Work up, SNF Pending bed offer   Patient Goals and CMS Choice        Expected Discharge Plan and Services Expected Discharge Plan: [place] In-house Referral: Clinical Social Work     Living arrangements for the past 2 months: Apartment                                      Prior Living Arrangements/Services Living arrangements for the past 2 months: Apartment Lives with:: Self, Spouse                   Activities of Daily Living      Permission Sought/Granted                  Emotional Assessment       Orientation: : Oriented to Self Alcohol / Substance Use: Not Applicable Psych Involvement: No (comment)  Admission diagnosis:  Brain tumor [facility]) [D49.6] Patient Active Problem List   Diagnosis Date Noted   Advanced care planning/counseling discussion    Goals of care, counseling/discussion    Palliative care by specialist    Hypokalemia 05/15/2022   Brain tumor ([place]) 05/13/2022   Trigeminal neuralgia 07/05/2021   Prediabetes 07/06/2020    Diverticulosis 07/06/2020   Dyslipidemia 12/30/2018   Hypertension    Obesity    PCP:  April Hatchet, NP Pharmacy:   [id number] [place], [id number]: [phone]: [phone]    Social Determinants of Health (SDOH) Interventions    Readmission Risk Interventions     No data to display

## 2022-05-28 NOTE — Progress Notes (Addendum)
   Inpatient Rehabilitation Admissions Coordinator   I spoke with daughter, [name], by phone as she had meeting with palliative on 7/15 to discuss dispo plans and goals of care moving forward. Felicia states pt's spouse/her stepfather, and her sister and brother are all unable to provide 24/7 care of patient after a CIR admit. She feels her Mom needs SNF placement. I have encouraged her to discuss with her siblings and step dad. I will alert acute team. We will sign off.  Danne Baxter, RN, MSN Rehab Admissions Coordinator [phone] 05/28/2022 2:18 PM

## 2022-05-28 NOTE — NC FL2 (Signed)
[place] LEVEL OF CARE SCREENING TOOL     IDENTIFICATION  Patient Name: April Saunders Birthdate: June 26, 1956 Sex: female Admission Date (Current Location): 05/13/2022  [facility] and [place] Number:  Herbalist and Address:  [facility], [place] [address] [place], [place] [address]      Provider Number: [id number]  Attending Physician Name and Address:  Kerney Elbe, DO  Relative Name and Phone Number:       Current Level of Care: Hospital Recommended Level of Care: [place] Prior Approval Number:    Date Approved/Denied:   PASRR Number: [id number]  Discharge Plan: SNF    Current Diagnoses: Patient Active Problem List   Diagnosis Date Noted   Advanced care planning/counseling discussion    Goals of care, counseling/discussion    Palliative care by specialist    Hypokalemia 05/15/2022   Brain tumor ([place]) 05/13/2022   Trigeminal neuralgia 07/05/2021   Prediabetes 07/06/2020   Diverticulosis 07/06/2020   Dyslipidemia 12/30/2018   Hypertension    Obesity     Orientation RESPIRATION BLADDER Height & Weight     Self, Place  Normal External catheter, Incontinent Weight: 132 lb 15 oz (60.3 kg) Height:  '5\' 3"'$  (160 cm)  BEHAVIORAL SYMPTOMS/MOOD NEUROLOGICAL BOWEL NUTRITION STATUS      Incontinent Diet (please see discharge summary)  AMBULATORY STATUS COMMUNICATION OF NEEDS Skin   Extensive Assist Verbally Surgical wounds (closed incision, head)                       Personal Care Assistance Level of Assistance  Bathing, Feeding, Dressing Bathing Assistance: Limited assistance Feeding assistance: Independent Dressing Assistance: Limited assistance     Functional Limitations Info  Sight, Hearing, Speech Sight Info: Adequate Hearing Info: Adequate Speech Info: Adequate    SPECIAL CARE FACTORS FREQUENCY  PT (By licensed PT), OT (By licensed OT)     PT Frequency: 5x per week OT Frequency:  5x per week            Contractures Contractures Info: Not present    Additional Factors Info  Code Status, Allergies Code Status Info: FULL Allergies Info: Atorvastatin Calcium,Atorvastatin Calcium,Lipitor ,Lisinopril,Pork-derived Products           Current Medications (05/28/2022):  This is the current hospital active medication list Current Facility-Administered Medications  Medication Dose Route Frequency Provider Last Rate Last Admin   acetaminophen (TYLENOL) tablet 650 mg  650 mg Oral Q4H PRN Vallarie Mare, MD   650 mg at 05/25/22 1003   Or   acetaminophen (TYLENOL) suppository 650 mg  650 mg Rectal Q4H PRN Vallarie Mare, MD       acetaminophen (TYLENOL) tablet 500 mg  500 mg Oral Q6H PRN Myles Rosenthal A, MD   500 mg at 05/24/22 1924   albuterol (PROVENTIL) (2.5 MG/3ML) 0.083% nebulizer solution 2.5 mg  2.5 mg Nebulization Q2H PRN Clance Boll, MD       amLODipine (NORVASC) tablet 2.5 mg  2.5 mg Oral Daily Eugenie Filler, MD   2.5 mg at 05/28/22 1136   carbamazepine (TEGRETOL) tablet 200 mg  200 mg Oral BID Vallarie Mare, MD   200 mg at 05/28/22 1136   dexamethasone (DECADRON) injection 6 mg  6 mg Intravenous Q6H Eugenie Filler, MD   6 mg at 05/28/22 1145   HYDROcodone-acetaminophen (NORCO/VICODIN) 5-325 MG per tablet 1 tablet  1 tablet Oral Q4H PRN  Vallarie Mare, MD   1 tablet at 05/26/22 2203   levETIRAcetam (KEPPRA) tablet 500 mg  500 mg Oral BID Raiford Noble [place], DO   500 mg at 05/28/22 1136   LORazepam (ATIVAN) injection 2 mg  2 mg Intravenous Q5 min PRN Vallarie Mare, MD   2 mg at 05/21/22 2358   ondansetron (ZOFRAN) tablet 4 mg  4 mg Oral Q6H PRN Clance Boll, MD       Or   ondansetron [facility]) injection 4 mg  4 mg Intravenous Q6H PRN Clance Boll, MD       ondansetron [facility]) tablet 4 mg  4 mg Oral Q4H PRN Vallarie Mare, MD       Or   ondansetron [facility]) injection 4 mg  4 mg Intravenous Q4H PRN  Vallarie Mare, MD       pantoprazole (PROTONIX) EC tablet 40 mg  40 mg Oral Daily Raiford Noble [place], DO   40 mg at 05/28/22 1136   promethazine (PHENERGAN) tablet 12.5-25 mg  12.5-25 mg Oral Q4H PRN Vallarie Mare, MD       simvastatin (ZOCOR) tablet 20 mg  20 mg Oral Daily Vallarie Mare, MD   20 mg at 05/28/22 1136     Discharge Medications: Please see discharge summary for a list of discharge medications.  Relevant Imaging Results:  Relevant Lab Results:   Additional Information SSN [id number]   PFIZER Comrnaty(Gray TOP) covd vaccine 09/24/2020 , 05/14/2020 , 05/11/2020  Vinie Sill, LCSW

## 2022-05-29 DIAGNOSIS — L8990 Pressure ulcer of unspecified site, unspecified stage: Secondary | ICD-10-CM | POA: Insufficient documentation

## 2022-05-29 DIAGNOSIS — E785 Hyperlipidemia, unspecified: Secondary | ICD-10-CM | POA: Diagnosis not present

## 2022-05-29 DIAGNOSIS — I10 Essential (primary) hypertension: Secondary | ICD-10-CM | POA: Diagnosis not present

## 2022-05-29 DIAGNOSIS — Z7189 Other specified counseling: Secondary | ICD-10-CM | POA: Diagnosis not present

## 2022-05-29 DIAGNOSIS — D496 Neoplasm of unspecified behavior of brain: Secondary | ICD-10-CM | POA: Diagnosis not present

## 2022-05-29 DIAGNOSIS — Z515 Encounter for palliative care: Secondary | ICD-10-CM | POA: Diagnosis not present

## 2022-05-29 LAB — COMPREHENSIVE METABOLIC PANEL
ALT: 55 U/L — ABNORMAL HIGH (ref 0–44)
AST: 26 U/L (ref 15–41)
Albumin: 3.5 g/dL (ref 3.5–5.0)
Alkaline Phosphatase: 60 U/L (ref 38–126)
Anion gap: 9 (ref 5–15)
BUN: 19 mg/dL (ref 8–23)
CO2: 28 mmol/L (ref 22–32)
Calcium: 9.4 mg/dL (ref 8.9–10.3)
Chloride: 94 mmol/L — ABNORMAL LOW (ref 98–111)
Creatinine, Ser: 0.7 mg/dL (ref 0.44–1.00)
GFR, Estimated: >60 mL/min (ref >60)
Glucose, Bld: 135 mg/dL — ABNORMAL HIGH (ref 70–99)
Potassium: 4.5 mmol/L (ref 3.5–5.1)
Sodium: 131 mmol/L — ABNORMAL LOW (ref 135–145)
Total Bilirubin: 0.8 mg/dL (ref 0.3–1.2)
Total Protein: 6.3 g/dL — ABNORMAL LOW (ref 6.5–8.1)

## 2022-05-29 LAB — CBC WITH DIFFERENTIAL/PLATELET
Abs Immature Granulocytes: 0.06 10*3/uL (ref 0.00–0.07)
Basophils Absolute: 0 10*3/uL (ref 0.0–0.1)
Basophils Relative: 0 %
Eosinophils Absolute: 0 10*3/uL (ref 0.0–0.5)
Eosinophils Relative: 0 %
HCT: 42.5 % (ref 36.0–46.0)
Hemoglobin: 14 g/dL (ref 12.0–15.0)
Immature Granulocytes: 0 %
Lymphocytes Relative: 7 %
Lymphs Abs: 1 10*3/uL (ref 0.7–4.0)
MCH: 26.2 pg (ref 26.0–34.0)
MCHC: 32.9 g/dL (ref 30.0–36.0)
MCV: 79.6 fL — ABNORMAL LOW (ref 80.0–100.0)
Monocytes Absolute: 0.7 10*3/uL (ref 0.1–1.0)
Monocytes Relative: 5 %
Neutro Abs: 13.2 10*3/uL — ABNORMAL HIGH (ref 1.7–7.7)
Neutrophils Relative %: 88 %
Platelets: 269 10*3/uL (ref 150–400)
RBC: 5.34 MIL/uL — ABNORMAL HIGH (ref 3.87–5.11)
RDW: 13.8 % (ref 11.5–15.5)
WBC: 15.1 10*3/uL — ABNORMAL HIGH (ref 4.0–10.5)
nRBC: 0 % (ref 0.0–0.2)

## 2022-05-29 LAB — PHOSPHORUS: Phosphorus: 3.6 mg/dL (ref 2.5–4.6)

## 2022-05-29 LAB — GLUCOSE, CAPILLARY
Glucose-Capillary: 110 mg/dL — ABNORMAL HIGH (ref 70–99)
Glucose-Capillary: 141 mg/dL — ABNORMAL HIGH (ref 70–99)

## 2022-05-29 LAB — MAGNESIUM: Magnesium: 2.4 mg/dL (ref 1.7–2.4)

## 2022-05-29 MED ORDER — SODIUM CHLORIDE 0.9 % IV SOLN: INTRAVENOUS | Status: DC

## 2022-05-29 NOTE — Progress Notes (Signed)
Daily Progress Note   Patient Name: April Saunders       Date: 05/29/2022 DOB: 1956-07-21  Age: 66 y.o. MRN#: [id number] Attending Physician: Kerney Elbe, DO Primary Care Physician: Willene Hatchet, NP Admit Date: 05/13/2022  Reason for Consultation/Follow-up: Establishing goals of care  Patient Profile/HPI: 66 y.o. female  with past medical history of hypertension hyperlipidemia degenerative joint disease obesity admitted on 05/13/2022 with fatigue and weakness ongoing for 3 to 4 weeks with difficulty balancing herself and falls.  CT of the brain showed lesions concerning for malignancy.  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  She has been started on steroids and Keppra.  Was seen by neuro-oncology.  Their current recommendation is to continue steroids and supportive care, however her clinical functional status is poor and she is not a current candidate for treatment planning.  Palliative medicine consulted for goals of care.  She has been denied CIR due to no family available to care for her after rehab.  Family is now seeking SNF rehab placement.  Subjective: Chart reviewed including labs, progress notes , imaging.  Noted she was able to get up to bedside commode today with PT.   April Saunders woke easily to my voice.  She denied any complaints of pain or other symptoms. I called and spoke with her daughter April Saunders- Her daughter reports that she also participated some in her bathing and is waking up a bit more and eating a bit more.  April Saunders she reports that she brushed her own teeth. April Saunders tells me the family continues to keep their faith that patient will be healed.  We discussed CODE STATUS.  They wish to continue full CODE STATUS.  We discussed there may be  a time that that preference would change.  April Saunders notes that she does not imagine a time that they would not want full code and full aggressive care.  The family is very strong and their faith held beliefs.  Review of Systems  Unable to perform ROS: Mental status change     Physical Exam Vitals and nursing note reviewed.  Constitutional:      Appearance: She is ill-appearing.  Cardiovascular:     Rate and Rhythm: Normal rate.  Pulmonary:     Effort: Pulmonary effort is normal.  Skin:  General: Skin is warm and dry.     Coloration: Skin is pale.  Neurological:     Mental Status: She is disoriented.     Comments: Lethargic but does arouse to my voice and with some stimulation she is able to answer some questions             Vital Signs: BP 129/76 (BP Location: Left Arm)   Pulse 79   Temp 97.6 F (36.4 C)   Resp 20   Ht '5\' 3"'$  (1.6 m)   Wt 60.1 kg   SpO2 98%   BMI 23.47 kg/m  SpO2: SpO2: 98 % O2 Device: O2 Device: Room Air O2 Flow Rate:    Intake/output summary:  Intake/Output Summary (Last 24 hours) at 05/29/2022 1322 Last data filed at 05/29/2022 0900 Gross per 24 hour  Intake 480 ml  Output 975 ml  Net -495 ml   LBM: Last BM Date : 05/25/22 Baseline Weight: Weight: 64.4 kg Most recent weight: Weight: 60.1 kg       Palliative Assessment/Data: PPS 30%      Patient Active Problem List   Diagnosis Date Noted   Pressure injury of skin 05/29/2022   Advanced care planning/counseling discussion    Goals of care, counseling/discussion    Palliative care by specialist    Hypokalemia 05/15/2022   Brain tumor ([place]) 05/13/2022   Trigeminal neuralgia 07/05/2021   Prediabetes 07/06/2020   Diverticulosis 07/06/2020   Dyslipidemia 12/30/2018   Hypertension    Obesity     Palliative Care Assessment & Plan    Assessment/Recommendations/Plan  Continue current plan of care for now Family is agreeable to pursuing SNF rehab placement in hopes that patient will  gain ability to be able to undergo treatment for her cancer Full CODE STATUS They are agreeable to outpatient palliative follow-up PMT will continue to follow   Code Status: Full code  Prognosis:  Unable to determine  Discharge Planning: To Be Determined  Care plan was discussed with patient's daughter April Saunders  Thank you for allowing the Palliative Medicine Team to assist in the care of this patient.  Total time:  60 mins  Greater than 50%  of this time was spent counseling and coordinating care related to the above assessment and plan.  Mariana Kaufman, AGNP-C Palliative Medicine   Please contact Palliative Medicine Team phone at [phone] for questions and concerns.

## 2022-05-29 NOTE — Progress Notes (Signed)
Occupational Therapy Treatment Patient Details Name: April Saunders MRN: [id number] DOB: Oct 15, 1956 Today's Date: 05/29/2022   History of present illness The pt is a 66 yo female presenting 7/2 with increased weakness and falls in last 3 weeks. CT revealed a 2.5 cm hypercellular mass with extensive vasogenic edema centered at the right deep gray nuclei with possible 2nd lesion in the right cerebellum. Pt now s/p stereotactic brain biopsy on 7/5. PMH includes: DJD, HLD, HTN, and trigenimal neuralgia.   OT comments  April Saunders did not make great functional progress this session with notable impairments in cognition. Pt found laying on the foot rest of recliner upon entry, attempted to assist pt to a safer position but she was actively resisting. NT and RN called to room. Overall she required mod-max A +2 to transfer from recliner to the bed. Pt held eyes closed throughout and did not follow functional commands. Pt's husband present at the end of the session, she did open her eyes and say his name with cues. Total A for hygiene after incontinent BM at bed level. OT to continue to follow, d/c updated to SNF. Per chart, pt no longer a candidate for AIR.    Recommendations for follow up therapy are one component of a multi-disciplinary discharge planning process, led by the attending physician.  Recommendations may be updated based on patient status, additional functional criteria and insurance authorization.    Follow Up Recommendations  Acute inpatient rehab (3hours/day)    Assistance Recommended at Discharge Frequent or constant Supervision/Assistance  Patient can return home with the following  Two people to help with walking and/or transfers;A lot of help with bathing/dressing/bathroom;Direct supervision/assist for medications management;Direct supervision/assist for financial management;Assist for transportation;Assistance with cooking/housework;Help with stairs or ramp for entrance;Assistance with feeding    Equipment Recommendations  Wheelchair (measurements OT);Wheelchair cushion (measurements OT);Tub/shower bench;Hospital bed    Recommendations for Other Services Rehab consult    Precautions / Restrictions Precautions Precautions: Fall Precaution Comments: incontinent of urine Restrictions Weight Bearing Restrictions: No       Mobility Bed Mobility Overal bed mobility: Needs Assistance Bed Mobility: Sit to Supine Rolling: Min assist              Transfers Overall transfer level: Needs assistance Equipment used: 2 person hand held assist Transfers: Sit to/from Stand, Bed to chair/wheelchair/BSC Sit to Stand: Mod assist Stand pivot transfers: Max assist         General transfer comment: NT assisting     Balance Overall balance assessment: Needs assistance Sitting-balance support: Feet supported, No upper extremity supported Sitting balance-Leahy Scale: Poor Sitting balance - Comments: Pt requires Min guard-Max A for sitting balance, favors LOB towards left. Increased tone LUE. Postural control: Left lateral lean Standing balance support: During functional activity Standing balance-Leahy Scale: Poor Standing balance comment: Dependent on external support in standing, narrow BoS.                           ADL either performed or assessed with clinical judgement   ADL Overall ADL's : Needs assistance/impaired                         Toilet Transfer: Maximal assistance;+2 for physical assistance;+2 for safety/equipment;Stand-pivot   Toileting- Clothing Manipulation and Hygiene: +2 for physical assistance;Bed level Toileting - Clothing Manipulation Details (indicate cue type and reason): incontinent BM - total A at bed level despite encouragement  Functional mobility during ADLs: Maximal assistance;+2 for physical assistance;+2 for safety/equipment General ADL Comments: simple and direct cues. no safety awareness or insight. limited  session due to pt being onfoot rest of recliner, soiled and resisting most assistance    Extremity/Trunk Assessment Upper Extremity Assessment Upper Extremity Assessment: LUE deficits/detail LUE Deficits / Details: Holding in flexed position; able to extend to -10* elbow extension with PROM; wrist and hand can be positioned, but minimal AROM noted. LUE Sensation: decreased light touch LUE Coordination: decreased fine motor;decreased gross motor   Lower Extremity Assessment Lower Extremity Assessment: Defer to PT evaluation        Vision   Vision Assessment?: Yes;Vision impaired- to be further tested in functional context Eye Alignment: Impaired (comment) Ocular Range of Motion: Impaired-to be further tested in functional context Tracking/Visual Pursuits: Impaired - to be further tested in functional context Visual Fields: Impaired-to be further tested in functional context   Perception Perception Perception: Impaired   Praxis Praxis Praxis: Impaired Praxis Impairment Details: Ideation;Initiation;Motor planning    Cognition Arousal/Alertness: Lethargic Behavior During Therapy: Flat affect, Restless Overall Cognitive Status: Difficult to assess Area of Impairment: Orientation, Attention, Memory, Following commands                 Orientation Level: Disoriented to, Situation, Time Current Attention Level: Focused Memory: Decreased short-term memory Following Commands: Follows one step commands inconsistently, Follows one step commands with increased time Safety/Judgement: Decreased awareness of safety, Decreased awareness of deficits Awareness: Intellectual Problem Solving: Slow processing, Decreased initiation, Difficulty sequencing, Requires verbal cues, Requires tactile cues General Comments: pt laying on foot rest of recliner upon arrival, not following commands for safty. resisting assistance. eyes closed for majority of session. oriented to person and place         Exercises      Shoulder Instructions       General Comments VSS on RA, NT and RN present at the end of the session    Pertinent Vitals/ Pain       Pain Assessment Pain Assessment: Faces Faces Pain Scale: No hurt Pain Intervention(s): Monitored during session  Home Living                                          Prior Functioning/Environment              Frequency  Min 2X/week        Progress Toward Goals  OT Goals(current goals can now be found in the care plan section)  Progress towards OT goals: Progressing toward goals  Acute Rehab OT Goals Patient Stated Goal: did not state OT Goal Formulation: Patient unable to participate in goal setting Time For Goal Achievement: 06/02/22 Potential to Achieve Goals: Good ADL Goals Pt Will Perform Grooming: with min assist;sitting Pt Will Perform Upper Body Dressing: with min assist;sitting Pt Will Perform Lower Body Dressing: with mod assist;sit to/from stand Pt Will Transfer to Toilet: with min assist;stand pivot transfer;bedside commode  Plan Discharge plan remains appropriate    Co-evaluation                 AM-PAC OT "6 Clicks" Daily Activity     Outcome Measure   Help from another person eating meals?: Total Help from another person taking care of personal grooming?: A Lot Help from another person toileting, which includes using toliet, bedpan, or urinal?: Total  Help from another person bathing (including washing, rinsing, drying)?: A Lot Help from another person to put on and taking off regular upper body clothing?: A Lot Help from another person to put on and taking off regular lower body clothing?: Total 6 Click Score: 9    End of Session    OT Visit Diagnosis: Unsteadiness on feet (R26.81);Muscle weakness (generalized) (M62.81);Ataxia, unspecified (R27.0);Other symptoms and signs involving cognitive function;Hemiplegia and hemiparesis;Low vision, both eyes (H54.2) Hemiplegia  - Right/Left: Left Hemiplegia - dominant/non-dominant: Non-Dominant Hemiplegia - caused by: Other cerebrovascular disease   Activity Tolerance Patient limited by lethargy   Patient Left in bed;with call bell/phone within reach;with bed alarm set;with nursing/sitter in room   Nurse Communication Mobility status        Time: 0413-6438 OT Time Calculation (min): 16 min  Charges: OT General Charges $OT Visit: 1 Visit OT Treatments $Therapeutic Activity: 8-22 mins    Elena Cothern A Treniece Holsclaw 05/29/2022, 5:43 PM

## 2022-05-29 NOTE — Progress Notes (Signed)
PROGRESS NOTE    Rhena Glace  YNW:[id number] DOB: Jul 03, 1956 DOA: 05/13/2022 PCP: Willene Hatchet, NP   Brief Narrative:  April Saunders is a 66 y.o. female with medical history significant of hypertension, hyperlipidemia, degenerative joint disease, obesity presented hospital with fatigue, weakness for 3 to 4 weeks with difficulty balancing himself  and falls.  In the ED, patient had left upper extremity deficit.  Further work-up in the ED showed tumor in the right deep gray matter with extensive vasogenic edema and possible lesion in the right cerebellum. Case was discussed with  neurosurgery.  Patient was then initiated on dexamethasone and Keppra and was admitted to the hospital for further evaluation and treatment.   During hospitalization, patient has been followed by neurosurgery.  CT scan of the brain brain protocol showed enhancing lesions in the brain.  CT scan of the chest abdomen and pelvis done 05/15/2022 without any evidence of lymphadenopathy.  She underwent a stereotactic brain biopsy with Dr. Marcello Moores on 05/16/2022 and CT of the head was done yesterday due to increased lethargy which was unremarkable.  Overall she is currently stable though her prognosis is extremely poor.  Neurosurgery felt that her strength is slightly improved and current plan is to have neuro-oncology see the patient as well as continuing Keppra 500 g p.o. twice daily and likely planning for inpatient rehab if she is a candidate.  Despite aggressive steroid dosing she continues to have periods of lethargy, confusion and hemiparesis and she is not able to stand, toilet or feed herself at this time and nor oncology evaluated and given that her functional status remains poor they are recommending a palliative care consult to help further with goals of care and disposition at this time but if her clinical status improves they can consider proceeding with staging and treatment planning.  Palliative care met with the patient's  family and they want to continue the current plan of care for now and family is interested in pursuing CIR with the goal of improving patient's functional status and allowing her to begin chemotherapy versus radiation therapy.  Currently she remains a little withdrawn.  CIR evaluated and unfortunately the family is unable to provide 24/7 care after a CIR admit so now recommendation is for SNF. TOC assisting with Disposition and bed availability and insurance authorization is still pending.   Assessment and plan Principal Problem:   Brain tumor [facility]) Active Problems:   Hypertension   Dyslipidemia   Hypokalemia   Multifocal Brain tumors with associated vasogenic edema and left sided weakness -Neurosurgery on board.   -Patient had presented with generalized weakness, fatigue, difficulty balancing herself as well as falls with left upper extremity deficit. -MRI shows multifocal somewhat infiltrative and hypercellular enhancing brain masses involving both cerebral hemispheres and brainstem and citrate cerebellum.  Possibility of CNS lymphoma.  -Extensive brain and brainstem edema with midline shift of 7 mm was reported.   -Continuing Dexamethasone IV 6 mg every 6 and levetiracetam 1000 mg IV every 12 as well as carbamazepine 200 mg p.o. twice daily.   -CT  brain lab protocol was then performed which showed multiple brightly enhancing mass lesions throughout the infratentorial and supratentorial brain most consistent with CNS lymphoma with vasogenic edema.   -Patient subsequently underwent stereotactic brain biopsy per neurosurgery, Dr. Marcello Moores, 05/16/2022. 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 -Repeat head CT scan without contrast on 05/22/2022 showed "Marked motion degradation. No additional complication evident following biopsy of the inferior  basal ganglia lesion on the right. Maximal measurement today is 2.6 cm. The study is markedly  regarded by motion and  assessment for subtle change is not  possible. Regional edema on the right causes mass effect with right-to-left shift estimated at 7 mm, similar to the prior exam.  No visible edema in the inferior cerebellum on the right or in the left temporal lobe." -CT scan of the chest abdomen and pelvis with no findings of lymphadenopathy to suggest lymphoma. We will follow neurosurgery recommendations. -Continue seizure precautions -She has some left-sided weakness and contractures and deficits -Neurooncology evaluated and unfortunately her status remains poor despite high-dose corticosteroids and she is not independent with ADLs at this time.  Neurooncology recommends palliative care consultation and continuing steroids and supportive care but if her clinical status improves then they can consider proceeding with staging and treatment but overall the prognosis has been discussed with the patient's daughter by the neuro oncologist. -Follow Neurosurgery and Neuro-Oncology Recc's -Palliative care has been consulted for further goals of care discussion given that her prognosis is extremely poor and that she has not made very much progress but family continues to remain interested in pursuing CIR with a goal of improving the patient's functional status to allow her to get further worked up with staging and treatment -PT and OT recommending CIR but patient unfortunately does not have 24-hour care after CIR admission still CIR has declined the patient and recommending the patient not going to SNF which is pending to be done   Hyperlipidemia. -Continue with Simvastatin 20 mg p.o. daily  Mild Hypokalemia. -Improved.  Patient's potassium is now 4.5 -Continue monitoring for the necessary -Mag level is now 2.4 -Continue to monitor and trend and repeat CMP in a.m.  Essential Hypertension. -Continue to hold antihypertensives for now.   -Continue to monitor blood pressure protocol -Patient is on HCTZ and amlodipine  at home. -Last blood pressure reading is now 152/80  History of Trigeminal Neuralgia.   -Patient takes carbamazepine at home and this has been resumed  Hyponatremia -Patient's sodium has dropped to 134 and is slightly low again and is 133 yesterday and today is 131 -Continue to monitor and trend and repeat CMP in a.m. and will now place on gentle IV fluid hydration with normal saline at 75 MLS per hour  Leukocytosis -In the setting of steroid demargination given that she is on dexamethasone -WBC went from 7.4 -> 11.3 -> 12.4 -> 12.6 -> 13.4 -> 15.8 -> 15.3 -> 15.1 Continue to monitor for signs and symptoms of infection; no overt infection noted -Repeat CBC in a.m.  Elevated ALT -Mild.  Patient's ALT went from 43 -> 55 -> 57 and slowly trended up to 60 -> 64 -> 55and likley in the setting of Steroids  -Continue to monitor and trend and repeat CMP in a.m.  Hypoalbuminemia -Patient's Albumin Level went from 3.2 -> 3.0 -> 3.1 -> 3.4 and today is 3.5 again today  -Continue to Monitor and Trend -Repeat CMP in the AM   Hyperglycemia -In the setting of steroid demargination -Continue monitor and trend CBGs and they have been ranging from 112-138 daily on BMP/CMP and CBGs ranging from 110-141 on fingersticks -If necessary will place on sensitive NovoLog cycle insulin   DVT prophylaxis: Place and maintain sequential compression device Start: 05/17/22 0749 SCDs Start: 05/16/22 1718 SCDs Start: 05/13/22 1051    Code Status: Full Code Family Communication: No family present at bedside  Disposition Plan:  Level  of care: Progressive Status is: Inpatient Remains inpatient appropriate because: Needs SNF and insurance authorization as well as bed availability given that she is no longer a candidate for CIR   Consultants:  Neurosurgery Neuro-oncology Palliative care medicine  Procedures:  CT head 05/13/2022, 05/17/2022 CT chest abdomen and pelvis 05/15/2022 MRI brain 05/13/2022 CT [facility]  head 05/14/2022 Stereotactic brain biopsy application of cranial navigation procedure note per Dr. Marcello Moores neurosurgery 05/16/2022  Antimicrobials:  Anti-infectives (From admission, onward)    Start     Dose/Rate Route Frequency Ordered Stop   05/16/22 2000  ceFAZolin (ANCEF) IVPB 1 g/50 mL premix        1 g 100 mL/hr over 30 Minutes Intravenous Every 8 hours 05/16/22 1717 05/17/22 1445   05/16/22 0745  ceFAZolin (ANCEF) IVPB 2g/100 mL premix        2 g 200 mL/hr over 30 Minutes Intravenous On call to O.R. 05/16/22 1700 05/16/22 1210       Subjective: Seen and examined at bedside continues to be withdrawn and when asleep and somnolent.  Continues to have the covers over her.  Denies any pain or nausea or vomiting though.  Not a CIR candidate so we will now go to SNF and currently awaiting bed availability.  No other concerns or complaints at this time.  Objective: Vitals:   05/29/22 0710 05/29/22 1147 05/29/22 1250 05/29/22 1500  BP:  129/76 (!) 152/83 (!) 152/83  Pulse:  79 68   Resp:  20    Temp: 98.3 F (36.8 C) 97.6 F (36.4 C)  (!) 97 F (36.1 C)  TempSrc: Oral     SpO2:  98% 100%   Weight:      Height:        Intake/Output Summary (Last 24 hours) at 05/29/2022 1716 Last data filed at 05/29/2022 1300 Gross per 24 hour  Intake 480 ml  Output 975 ml  Net -495 ml   Filed Weights   05/27/22 0500 05/28/22 0500 05/29/22 0303  Weight: 61 kg 60.3 kg 60.1 kg   Examination: Physical Exam:  Constitutional: WN/WD African-American female currently no acute distress appears withdrawn and wanting to sleep and somewhat Respiratory: Diminished to auscultation bilaterally, no wheezing, rales, rhonchi or crackles. Normal respiratory effort and patient is not tachypenic. No accessory muscle use.  Unlabored breathing and not wearing any supplemental oxygen nasal cannula Cardiovascular: RRR, no murmurs / rubs / gallops. S1 and S2 auscultated.  Mild 1+ extremity edema.  Abdomen: Soft,  non-tender, non-distended.  Bowel sounds positive.  GU: Deferred. Musculoskeletal: No clubbing / cyanosis of digits/nails. No joint deformity upper and lower extremities.  Skin: No rashes, lesions, ulcers on limited skin evaluation. No induration; Warm and dry.  Neurologic: Continues to have some left-sided deficits left-sided.  A little flaccid and continues to have some slight contractures Psychiatric: Impaired judgment and insight and she is somnolent and drowsy again  Data Reviewed: I have personally reviewed following labs and imaging studies  CBC: Recent Labs  Lab 05/25/22 0336 05/26/22 0231 05/27/22 0248 05/28/22 0210 05/29/22 0008  WBC 12.6* 13.4* 15.8* 15.3* 15.1*  NEUTROABS 10.9* 12.1* 13.6* 13.6* 13.2*  HGB 13.2 13.9 14.0 14.7 14.0  HCT 38.9 41.6 40.1 43.7 42.5  MCV 78.6* 79.7* 76.8* 78.9* 79.6*  PLT 163 242 249 265 174   Basic Metabolic Panel: Recent Labs  Lab 05/25/22 0336 05/26/22 0231 05/27/22 0248 05/28/22 0210 05/29/22 0008  NA 136 136 134* 133* 131*  K 4.2 4.0 4.2  4.2 4.5  CL 100 99 97* 96* 94*  CO2 26 27 25 26 28   GLUCOSE 124* 138* 118* 127* 135*  BUN 17 16 18 15 19   CREATININE 0.73 0.76 0.71 0.66 0.70  CALCIUM 8.9 9.3 9.3 9.6 9.4  MG 1.9 2.1 2.2 2.3 2.4  PHOS 3.2 3.5 3.1 3.4 3.6   GFR: Estimated Creatinine Clearance: 58 mL/min (by C-G formula based on SCr of 0.7 mg/dL). Liver Function Tests: Recent Labs  Lab 05/25/22 0336 05/26/22 0231 05/27/22 0248 05/28/22 0210 05/29/22 0008  AST 40 28 25 26 26   ALT 55* 57* 60* 64* 55*  ALKPHOS 44 50 51 55 60  BILITOT 1.0 0.4 0.5 0.7 0.8  PROT 5.5* 6.4* 6.4* 6.6 6.3*  ALBUMIN 3.1* 3.4* 3.5 3.5 3.5   No results for input(s): "LIPASE", "AMYLASE" in the last 168 hours. No results for input(s): "AMMONIA" in the last 168 hours. Coagulation Profile: No results for input(s): "INR", "PROTIME" in the last 168 hours. Cardiac Enzymes: No results for input(s): "CKTOTAL", "CKMB", "CKMBINDEX", "TROPONINI" in  the last 168 hours. BNP (last 3 results) No results for input(s): "PROBNP" in the last 8760 hours. HbA1C: No results for input(s): "HGBA1C" in the last 72 hours. CBG: Recent Labs  Lab 05/27/22 0751 05/28/22 0712 05/28/22 1211 05/29/22 0716 05/29/22 1145  GLUCAP 130* 130* 128* 110* 141*   Lipid Profile: No results for input(s): "CHOL", "HDL", "LDLCALC", "TRIG", "CHOLHDL", "LDLDIRECT" in the last 72 hours. Thyroid Function Tests: No results for input(s): "TSH", "T4TOTAL", "FREET4", "T3FREE", "THYROIDAB" in the last 72 hours. Anemia Panel: No results for input(s): "VITAMINB12", "FOLATE", "FERRITIN", "TIBC", "IRON", "RETICCTPCT" in the last 72 hours. Sepsis Labs: No results for input(s): "PROCALCITON", "LATICACIDVEN" in the last 168 hours.  No results found for this or any previous visit (from the past 240 hour(s)).   Radiology Studies: No results found.   Scheduled Meds:  amLODipine  2.5 mg Oral Daily   carbamazepine  200 mg Oral BID   dexamethasone (DECADRON) injection  6 mg Intravenous Q6H   levETIRAcetam  500 mg Oral BID   pantoprazole  40 mg Oral Daily   simvastatin  20 mg Oral Daily   Continuous Infusions:  sodium chloride      LOS: 16 days   Raiford Noble, DO [facility] Available via Epic secure chat 7am-7pm After these hours, please refer to coverage provider listed on [email or web address] 05/29/2022, 5:16 PM

## 2022-05-29 NOTE — Progress Notes (Signed)
Physical Therapy Treatment Patient Details Name: April Saunders MRN: [id number] DOB: 03/08/1956 Today's Date: 05/29/2022   History of Present Illness The pt is a 66 yo female presenting 7/2 with increased weakness and falls in last 3 weeks. CT revealed a 2.5 cm hypercellular mass with extensive vasogenic edema centered at the right deep gray nuclei with possible 2nd lesion in the right cerebellum. Pt now s/p stereotactic brain biopsy on 7/5. PMH includes: DJD, HLD, HTN, and trigenimal neuralgia.    PT Comments    Patient progressing slowly towards PT goals. Pt is lethargic and keeps eyes closed for most of session. Requires mod A of 1-2 for standing and transfers today with max cues due to restlessness and distractibility. Able to be redirected better than prior session as pt fidgeting with lines, trying to pull wet, soiled blanket on top of her etc. Able to step pivot to chair with assist of 2 for weight shifting, LE advancement and balance. Mild left knee instability noted as well as increased tone in LUE. Pt continues to have cognitive deficits relating to attention, awareness, safety, memory, orientation and arousal putting her at increased risk for falls. Limited due to fatigue, lethargy and being cold. Discharge plan updated to SNF as family not able to provide 24/7 supervision at d/c and interested in SNF placement. Will follow.   Recommendations for follow up therapy are one component of a multi-disciplinary discharge planning process, led by the attending physician.  Recommendations may be updated based on patient status, additional functional criteria and insurance authorization.  Follow Up Recommendations  Skilled nursing-short term rehab (<3 hours/day)     Assistance Recommended at Discharge Frequent or constant Supervision/Assistance  Patient can return home with the following A lot of help with bathing/dressing/bathroom;Assistance with cooking/housework;Assistance with feeding;Direct  supervision/assist for medications management;Direct supervision/assist for financial management;Assist for transportation;Help with stairs or ramp for entrance;Two people to help with walking and/or transfers   Equipment Recommendations  Wheelchair (measurements PT);Wheelchair cushion (measurements PT)    Recommendations for Other Services       Precautions / Restrictions Precautions Precautions: Fall Precaution Comments: incontinent of urine Restrictions Weight Bearing Restrictions: No     Mobility  Bed Mobility Overal bed mobility: Needs Assistance Bed Mobility: Rolling Rolling: Min assist   Supine to sit: Mod assist     General bed mobility comments: Pt laying horizontally on the bed upon PT arrival with LEs hanging off the bedrails attempting to get OOB. Rolling to right/left to remove soiled pad; pt reaching for therapist's arm to elevate trunk to get to EOB. Max cues to looks towards left side, but able to reach with RUE towards left rail with initiation assist. Tendency to have posterior lean/bias resulting in bottom being too far forward to EOB.    Transfers Overall transfer level: Needs assistance Equipment used: 2 person hand held assist Transfers: Sit to/from Stand, Bed to chair/wheelchair/BSC Sit to Stand: Mod assist, +2 physical assistance   Step pivot transfers: Mod assist, +2 physical assistance       General transfer comment: Mod A of 1 digressing to Mod A of 2 to stand from EOB with manual cues for foot placement (pt likes to keep legs crossed at ankles); LOB towards left when returning to seated position, LUE with increased tone held close to chest/body. Able to perform step pivot transfer to chair with assist for weight shifting, balance and foot advancement.    Ambulation/Gait  General Gait Details: Deferred.   Stairs             Wheelchair Mobility    Modified Rankin (Stroke Patients Only)       Balance Overall  balance assessment: Needs assistance Sitting-balance support: Feet supported, No upper extremity supported Sitting balance-Leahy Scale: Poor Sitting balance - Comments: Pt requires Min guard-Max A for sitting balance, favors LOB towards left. Increased tone LUE. Postural control: Left lateral lean Standing balance support: During functional activity Standing balance-Leahy Scale: Poor Standing balance comment: Dependent on external support in standing, narrow BoS.                            Cognition Arousal/Alertness: Lethargic Behavior During Therapy: Flat affect, Restless Overall Cognitive Status: Difficult to assess Area of Impairment: Orientation, Attention, Memory, Following commands                 Orientation Level: Disoriented to, Situation, Time ("June") Current Attention Level: Focused Memory: Decreased short-term memory Following Commands: Follows one step commands inconsistently, Follows one step commands with increased time Safety/Judgement: Decreased awareness of safety, Decreased awareness of deficits Awareness: Intellectual Problem Solving: Slow processing, Decreased initiation, Difficulty sequencing, Requires verbal cues, Requires tactile cues General Comments: "what do you need me to do now?" Eyes remained closed for most of session. Fidgeting with lines, reaching for wet blanktes to cover self, needs cues for re-directions throughout session, restless. Follows simple commands.        Exercises      General Comments General comments (skin integrity, edema, etc.): VSS on RA. Soiled in urine, Changed gown and removed wet pad.      Pertinent Vitals/Pain Pain Assessment Pain Assessment: Faces Faces Pain Scale: No hurt    Home Living                          Prior Function            PT Goals (current goals can now be found in the care plan section) Progress towards PT goals: Progressing toward goals (slowly)     Frequency    Min 3X/week      PT Plan Discharge plan needs to be updated    Co-evaluation              AM-PAC PT "6 Clicks" Mobility   Outcome Measure  Help needed turning from your back to your side while in a flat bed without using bedrails?: A Lot Help needed moving from lying on your back to sitting on the side of a flat bed without using bedrails?: A Lot Help needed moving to and from a bed to a chair (including a wheelchair)?: Total Help needed standing up from a chair using your arms (e.g., wheelchair or bedside chair)?: Total Help needed to walk in hospital room?: Total Help needed climbing 3-5 steps with a railing? : Total 6 Click Score: 8    End of Session Equipment Utilized During Treatment: Gait belt Activity Tolerance: Patient limited by lethargy Patient left: in chair;with call bell/phone within reach;with chair alarm set Nurse Communication: Mobility status PT Visit Diagnosis: Other abnormalities of gait and mobility (R26.89);Muscle weakness (generalized) (M62.81);Difficulty in walking, not elsewhere classified (R26.2)     Time: 1500-1520 PT Time Calculation (min) (ACUTE ONLY): 20 min  Charges:  $Therapeutic Activity: 8-22 mins  Zettie Cooley, DPT Acute Rehabilitation Services Secure chat preferred Office [place] 05/29/2022, 4:04 PM

## 2022-05-30 DIAGNOSIS — E785 Hyperlipidemia, unspecified: Secondary | ICD-10-CM | POA: Diagnosis not present

## 2022-05-30 DIAGNOSIS — I10 Essential (primary) hypertension: Secondary | ICD-10-CM | POA: Diagnosis not present

## 2022-05-30 DIAGNOSIS — D496 Neoplasm of unspecified behavior of brain: Secondary | ICD-10-CM | POA: Diagnosis not present

## 2022-05-30 LAB — CBC WITH DIFFERENTIAL/PLATELET
Abs Immature Granulocytes: 0.11 10*3/uL — ABNORMAL HIGH (ref 0.00–0.07)
Basophils Absolute: 0 10*3/uL (ref 0.0–0.1)
Basophils Relative: 0 %
Eosinophils Absolute: 0 10*3/uL (ref 0.0–0.5)
Eosinophils Relative: 0 %
HCT: 41.7 % (ref 36.0–46.0)
Hemoglobin: 13.8 g/dL (ref 12.0–15.0)
Immature Granulocytes: 1 %
Lymphocytes Relative: 6 %
Lymphs Abs: 1 10*3/uL (ref 0.7–4.0)
MCH: 26.5 pg (ref 26.0–34.0)
MCHC: 33.1 g/dL (ref 30.0–36.0)
MCV: 80 fL (ref 80.0–100.0)
Monocytes Absolute: 0.9 10*3/uL (ref 0.1–1.0)
Monocytes Relative: 6 %
Neutro Abs: 13.5 10*3/uL — ABNORMAL HIGH (ref 1.7–7.7)
Neutrophils Relative %: 87 %
Platelets: 235 10*3/uL (ref 150–400)
RBC: 5.21 MIL/uL — ABNORMAL HIGH (ref 3.87–5.11)
RDW: 13.7 % (ref 11.5–15.5)
WBC: 15.5 10*3/uL — ABNORMAL HIGH (ref 4.0–10.5)
nRBC: 0 % (ref 0.0–0.2)

## 2022-05-30 LAB — COMPREHENSIVE METABOLIC PANEL
ALT: 51 U/L — ABNORMAL HIGH (ref 0–44)
AST: 18 U/L (ref 15–41)
Albumin: 3.3 g/dL — ABNORMAL LOW (ref 3.5–5.0)
Alkaline Phosphatase: 56 U/L (ref 38–126)
Anion gap: 12 (ref 5–15)
BUN: 17 mg/dL (ref 8–23)
CO2: 24 mmol/L (ref 22–32)
Calcium: 9.2 mg/dL (ref 8.9–10.3)
Chloride: 96 mmol/L — ABNORMAL LOW (ref 98–111)
Creatinine, Ser: 0.57 mg/dL (ref 0.44–1.00)
GFR, Estimated: >60 mL/min (ref >60)
Glucose, Bld: 133 mg/dL — ABNORMAL HIGH (ref 70–99)
Potassium: 4.3 mmol/L (ref 3.5–5.1)
Sodium: 132 mmol/L — ABNORMAL LOW (ref 135–145)
Total Bilirubin: 0.5 mg/dL (ref 0.3–1.2)
Total Protein: 6 g/dL — ABNORMAL LOW (ref 6.5–8.1)

## 2022-05-30 LAB — GLUCOSE, CAPILLARY: Glucose-Capillary: 125 mg/dL — ABNORMAL HIGH (ref 70–99)

## 2022-05-30 LAB — PHOSPHORUS: Phosphorus: 3.4 mg/dL (ref 2.5–4.6)

## 2022-05-30 LAB — MAGNESIUM: Magnesium: 2.1 mg/dL (ref 1.7–2.4)

## 2022-05-30 NOTE — TOC Progression Note (Signed)
Transition of Care [facility]) - Progression Note    Patient Details  Name: April Saunders MRN: [id number] Date of Birth: [birth year]  Transition of Care [facility] CM/SW [place], [place] Phone Number: 05/30/2022, 10:28 AM  Clinical Narrative:     Left voice message with daughter,Felicia- of bed offers and requested return call.  TOC will continue to follow and assist with discharge planning.  Expected Discharge Plan: Skilled Nursing Facility Barriers to Discharge: Ship broker, Continued Medical Work up, SNF Pending bed offer  Expected Discharge Plan and Services Expected Discharge Plan: [place] In-house Referral: Clinical Social Work     Living arrangements for the past 2 months: Apartment                                       Social Determinants of Health (SDOH) Interventions    Readmission Risk Interventions     No data to display

## 2022-05-30 NOTE — Progress Notes (Signed)
PROGRESS NOTE    April Saunders  KDX:[id number] DOB: Aug 02, 1956 DOA: 05/13/2022 PCP: Willene Hatchet, NP   Brief Narrative:  April Saunders is a 66 y.o. female with medical history significant of hypertension, hyperlipidemia, degenerative joint disease, obesity presented hospital with fatigue, weakness for 3 to 4 weeks with difficulty balancing himself  and falls.  In the ED, patient had left upper extremity deficit.  Further work-up in the ED showed tumor in the right deep gray matter with extensive vasogenic edema and possible lesion in the right cerebellum.  Neurosurgery consulted, patient is status post brain biopsy with Dr. 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  Awaiting SNF disposition.  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 twice daily.   Continue seizure precautions Neurooncology evaluated and unfortunately her status remains poor despite high-dose corticosteroids and she is not independent with ADLs at this time.  Neurooncology recommends palliative care consultation and continuing steroids and supportive care but if her clinical status improves then they can consider proceeding with staging and treatment but overall the prognosis has been discussed with the patient's daughter by the neuro oncologist. Palliative care has been consulted for further goals of care discussion given that her prognosis is extremely poor and that she has not made very much progress but family continues to  remain interested in pursuing full code Awaiting SNF   Hyperlipidemia. Continue with Simvastatin 20 mg p.o. daily   Essential Hypertension. Continue to hold antihypertensives for now.   Continue to monitor blood pressure protocol Patient is on HCTZ and amlodipine at home   History of Trigeminal Neuralgia.   Patient takes carbamazepine at home and this has been resumed   Hyponatremia Continue gentle IV fluid for now Daily BMP   Leukocytosis In the setting of steroid demargination given that she is on dexamethasone    DVT prophylaxis: Place and maintain sequential compression device Start: 05/17/22 0749 SCDs Start: 05/16/22 1718 SCDs Start: 05/13/22 1051    Code Status: Full Code Family Communication: No family present at bedside  Disposition Plan:  Level of care: Progressive Status is: Inpatient Remains inpatient appropriate because: Needs SNF and insurance authorization as well as bed availability given that she is no longer a candidate for CIR   Consultants:  Neurosurgery Neuro-oncology Palliative care medicine  Procedures:  CT head 05/13/2022, 05/17/2022 CT chest abdomen and pelvis 05/15/2022 MRI brain 05/13/2022 CT BrainLab head 05/14/2022 Stereotactic brain biopsy application of cranial navigation procedure note per Dr. Marcello Moores neurosurgery 05/16/2022  Antimicrobials:  Anti-infectives (From admission, onward)    Start     Dose/Rate Route Frequency Ordered Stop   05/16/22 2000  ceFAZolin (ANCEF) IVPB 1 g/50 mL premix        1 g 100 mL/hr over 30 Minutes Intravenous Every 8 hours 05/16/22 1717 05/17/22 1445   05/16/22 0745  ceFAZolin (ANCEF) IVPB 2g/100 mL premix        2 g 200 mL/hr over 30 Minutes Intravenous On call to O.R. 05/16/22 9767 05/16/22 1210  Subjective: Patient seen and examined at bedside, follows command, withdrawn.  Denies any new complaints  Objective: Vitals:   05/30/22 0813 05/30/22 1140 05/30/22 1519 05/30/22 1931  BP: 124/77 (!) 123/59  116/68 121/63  Pulse: (!) 58 (!) 51 (!) 53 61  Resp: '15 14 13 19  '$ Temp: 97.9 F (36.6 C) 97.7 F (36.5 C) (!) 97.5 F (36.4 C) 98.4 F (36.9 C)  TempSrc: Oral Oral Oral Oral  SpO2: 98% 100% 100% 100%  Weight:      Height:        Intake/Output Summary (Last 24 hours) at 05/30/2022 1952 Last data filed at 05/30/2022 1330 Gross per 24 hour  Intake 600 ml  Output 1700 ml  Net -1100 ml   Filed Weights   05/28/22 0500 05/29/22 0303 05/30/22 0440  Weight: 60.3 kg 60.1 kg 58.6 kg   Examination: Physical Exam:  General: NAD, chronically ill-appearing, appears withdrawn Cardiovascular: S1, S2 present Respiratory: CTAB Abdomen: Soft, nontender, nondistended, bowel sounds present Musculoskeletal: No bilateral pedal edema noted Skin: Normal Psychiatry: Poor mood Neurology: Noted left-sided upper and lower extremity weakness     Data Reviewed: I have personally reviewed following labs and imaging studies  CBC: Recent Labs  Lab 05/26/22 0231 05/27/22 0248 05/28/22 0210 05/29/22 0008 05/30/22 0157  WBC 13.4* 15.8* 15.3* 15.1* 15.5*  NEUTROABS 12.1* 13.6* 13.6* 13.2* 13.5*  HGB 13.9 14.0 14.7 14.0 13.8  HCT 41.6 40.1 43.7 42.5 41.7  MCV 79.7* 76.8* 78.9* 79.6* 80.0  PLT 242 249 265 269 510   Basic Metabolic Panel: Recent Labs  Lab 05/26/22 0231 05/27/22 0248 05/28/22 0210 05/29/22 0008 05/30/22 0157  NA 136 134* 133* 131* 132*  K 4.0 4.2 4.2 4.5 4.3  CL 99 97* 96* 94* 96*  CO2 '27 25 26 28 24  '$ GLUCOSE 138* 118* 127* 135* 133*  BUN '16 18 15 19 17  '$ CREATININE 0.76 0.71 0.66 0.70 0.57  CALCIUM 9.3 9.3 9.6 9.4 9.2  MG 2.1 2.2 2.3 2.4 2.1  PHOS 3.5 3.1 3.4 3.6 3.4   GFR: Estimated Creatinine Clearance: 58 mL/min (by C-G formula based on SCr of 0.57 mg/dL). Liver Function Tests: Recent Labs  Lab 05/26/22 0231 05/27/22 0248 05/28/22 0210 05/29/22 0008 05/30/22 0157  AST '28 25 26 26 18  '$ ALT 57* 60* 64* 55* 51*  ALKPHOS 50 51 55 60 56  BILITOT 0.4 0.5 0.7  0.8 0.5  PROT 6.4* 6.4* 6.6 6.3* 6.0*  ALBUMIN 3.4* 3.5 3.5 3.5 3.3*   No results for input(s): "LIPASE", "AMYLASE" in the last 168 hours. No results for input(s): "AMMONIA" in the last 168 hours. Coagulation Profile: No results for input(s): "INR", "PROTIME" in the last 168 hours. Cardiac Enzymes: No results for input(s): "CKTOTAL", "CKMB", "CKMBINDEX", "TROPONINI" in the last 168 hours. BNP (last 3 results) No results for input(s): "PROBNP" in the last 8760 hours. HbA1C: No results for input(s): "HGBA1C" in the last 72 hours. CBG: Recent Labs  Lab 05/28/22 0712 05/28/22 1211 05/29/22 0716 05/29/22 1145 05/30/22 0813  GLUCAP 130* 128* 110* 141* 125*   Lipid Profile: No results for input(s): "CHOL", "HDL", "LDLCALC", "TRIG", "CHOLHDL", "LDLDIRECT" in the last 72 hours. Thyroid Function Tests: No results for input(s): "TSH", "T4TOTAL", "FREET4", "T3FREE", "THYROIDAB" in the last 72 hours. Anemia Panel: No results for input(s): "VITAMINB12", "FOLATE", "FERRITIN", "TIBC", "IRON", "RETICCTPCT" in the last 72 hours. Sepsis Labs: No results for input(s): "PROCALCITON", "LATICACIDVEN" in the last 168 hours.  No results found for this  or any previous visit (from the past 240 hour(s)).   Radiology Studies: No results found.   Scheduled Meds:  amLODipine  2.5 mg Oral Daily   carbamazepine  200 mg Oral BID   dexamethasone (DECADRON) injection  6 mg Intravenous Q6H   levETIRAcetam  500 mg Oral BID   pantoprazole  40 mg Oral Daily   simvastatin  20 mg Oral Daily   Continuous Infusions:  sodium chloride 75 mL/hr at 05/30/22 1744    LOS: 17 days   Alma Friendly, MD [facility] Available via Epic secure chat 7am-7pm After these hours, please refer to coverage provider listed on [email or web address] 05/30/2022, 7:52 PM

## 2022-05-30 NOTE — TOC Progression Note (Signed)
Transition of Care [facility]) - Progression Note    Patient Details  Name: April Saunders MRN: [id number] Date of Birth: [birth year]  Transition of Care [facility]) CM/SW [place], [place] Phone Number: 05/30/2022, 10:37 AM  Clinical Narrative:     CSW spoke with patient's daughter- informed of be offer- family requested to visit facilities and let CSW of their SNF choice.   Thurmond Butts, MSW, LCSW Clinical Social Worker    Expected Discharge Plan: Skilled Nursing Facility Barriers to Discharge: Ship broker, Continued Medical Work up, SNF Pending bed offer  Expected Discharge Plan and Services Expected Discharge Plan: [place] In-house Referral: Clinical Social Work     Living arrangements for the past 2 months: Apartment                                       Social Determinants of Health (SDOH) Interventions    Readmission Risk Interventions     No data to display

## 2022-05-31 DIAGNOSIS — D496 Neoplasm of unspecified behavior of brain: Secondary | ICD-10-CM | POA: Diagnosis not present

## 2022-05-31 DIAGNOSIS — E785 Hyperlipidemia, unspecified: Secondary | ICD-10-CM | POA: Diagnosis not present

## 2022-05-31 DIAGNOSIS — Z7189 Other specified counseling: Secondary | ICD-10-CM | POA: Diagnosis not present

## 2022-05-31 DIAGNOSIS — I10 Essential (primary) hypertension: Secondary | ICD-10-CM | POA: Diagnosis not present

## 2022-05-31 LAB — CBC WITH DIFFERENTIAL/PLATELET
Abs Immature Granulocytes: 0.06 10*3/uL (ref 0.00–0.07)
Basophils Absolute: 0 10*3/uL (ref 0.0–0.1)
Basophils Relative: 0 %
Eosinophils Absolute: 0 10*3/uL (ref 0.0–0.5)
Eosinophils Relative: 0 %
HCT: 38.2 % (ref 36.0–46.0)
Hemoglobin: 12.7 g/dL (ref 12.0–15.0)
Immature Granulocytes: 0 %
Lymphocytes Relative: 6 %
Lymphs Abs: 0.9 10*3/uL (ref 0.7–4.0)
MCH: 26.4 pg (ref 26.0–34.0)
MCHC: 33.2 g/dL (ref 30.0–36.0)
MCV: 79.4 fL — ABNORMAL LOW (ref 80.0–100.0)
Monocytes Absolute: 0.6 10*3/uL (ref 0.1–1.0)
Monocytes Relative: 4 %
Neutro Abs: 12.9 10*3/uL — ABNORMAL HIGH (ref 1.7–7.7)
Neutrophils Relative %: 90 %
Platelets: 241 10*3/uL (ref 150–400)
RBC: 4.81 MIL/uL (ref 3.87–5.11)
RDW: 13.6 % (ref 11.5–15.5)
WBC: 14.4 10*3/uL — ABNORMAL HIGH (ref 4.0–10.5)
nRBC: 0 % (ref 0.0–0.2)

## 2022-05-31 LAB — BASIC METABOLIC PANEL
Anion gap: 10 (ref 5–15)
BUN: 14 mg/dL (ref 8–23)
CO2: 25 mmol/L (ref 22–32)
Calcium: 9 mg/dL (ref 8.9–10.3)
Chloride: 98 mmol/L (ref 98–111)
Creatinine, Ser: 0.69 mg/dL (ref 0.44–1.00)
GFR, Estimated: >60 mL/min (ref >60)
Glucose, Bld: 128 mg/dL — ABNORMAL HIGH (ref 70–99)
Potassium: 4 mmol/L (ref 3.5–5.1)
Sodium: 133 mmol/L — ABNORMAL LOW (ref 135–145)

## 2022-05-31 NOTE — Progress Notes (Signed)
Speech Language Pathology Treatment: Dysphagia  April Saunders Details Name: April Saunders MRN: [id number] DOB: [birth year] Today's Date: 05/31/2022 Time: 9381-0175 SLP Time Calculation (min) (ACUTE ONLY): 14 min  Assessment / Plan / Recommendation Clinical Impression  April Saunders was very sleepy at first, but awakened well despite keeping her eyes closed. She needed cueing in part because she was not looking at POs and attending to them to anticipate what she was doing. However, she did not have any overt oral holding or signs of dysphagia. Recommend maintaining Dys3 diet and thin lqiuids in light of cognitive status, although would anticipate good prognosis for diet advancement with improved mentation.   HPI HPI: April Saunders is a 66 y.o. female who presented with fatigue, weakness for 3 to 4 weeks with progression of generalized weakness , and diffuclty with balance. In the ED, April Saunders had left upper extremity deficit. MRI brain 7/2: Multifocal, but also somewhat infiltrative and hypercellular enhancing brain masses involving both cerebral hemispheres, the brainstem, and the right cerebellum. Neurosurgery recommended Decadron and Keppra. April Saunders s/p  stereotactic brain biopsy 7/5. PMH: HLD, HTN, DJD, obesity, trigeminal neuralgia.ST evaluated on 05/17/22 for swallowing with Dysphagia 3/thin liquids recommended; f/u Speech/language evaluation completed on 05/18/22 with deficits noted with cognition.  ST f/u for diet tolerance/cog tx, but d/t April Saunders waxing/waning regarding alertness level, tx session have been unable to be completed since 05/18/22.      SLP Plan  Continue with current plan of care      Recommendations for follow up therapy are one component of a multi-disciplinary discharge planning process, led by the attending physician.  Recommendations may be updated based on April Saunders status, additional functional criteria and insurance authorization.    Recommendations  Diet recommendations: Dysphagia 3 (mechanical soft);Thin  liquid Liquids provided via: Straw Medication Administration: Whole meds with puree Supervision: Staff to assist with self feeding;Full supervision/cueing for compensatory strategies Compensations: Slow rate;Small sips/bites;Minimize environmental distractions Postural Changes and/or Swallow Maneuvers: Seated upright 90 degrees                Oral Care Recommendations: Oral care BID;Staff/trained caregiver to provide oral care Follow Up Recommendations: Other (comment) Assistance recommended at discharge: Frequent or constant Supervision/Assistance SLP Visit Diagnosis: Dysphagia, unspecified (R13.10) Plan: Continue with current plan of care           Osie Bond., M.A. [place] Office [phone]  Secure chat preferred   05/31/2022, 12:37 PM

## 2022-05-31 NOTE — TOC Progression Note (Signed)
Transition of Care [facility]) - Progression Note    Patient Details  Name: April Saunders MRN: [id number] Date of Birth: 25-Mar-1956  Transition of Care [facility]) CM/SW [place], RN Phone [phone]  05/31/2022, 9:29 AM  Clinical Narrative:    TOC CM following for outpatient palliative care consult. CM at bedside to discuss with patient. Patient verbalizes understanding of palliative care referral. CM offered choice. Patient states that she does not have a preference. Referral has been sent to [facility] with Chief Technology Officer.    Expected Discharge Plan: Skilled Nursing Facility Barriers to Discharge: Ship broker, Continued Medical Work up, SNF Pending bed offer  Expected Discharge Plan and Services Expected Discharge Plan: [place] In-house Referral: Clinical Social Work     Living arrangements for the past 2 months: Apartment                                       Social Determinants of Health (SDOH) Interventions    Readmission Risk Interventions    05/31/2022    9:28 AM  Readmission Risk Prevention Plan  Transportation Screening Complete  PCP or Specialist Appt within 5-7 Days Complete  Home Care Screening Complete  Medication Review (RN CM) Referral to Pharmacy

## 2022-05-31 NOTE — Progress Notes (Signed)
PROGRESS NOTE    April Saunders  WUJ:[id number] DOB: 1956-07-09 DOA: 05/13/2022 PCP: Willene Hatchet, NP   Brief Narrative:  April Saunders is a 66 y.o. female with medical history significant of hypertension, hyperlipidemia, degenerative joint disease, obesity presented hospital with fatigue, weakness for 3 to 4 weeks with difficulty balancing himself  and falls.  In the ED, patient had left upper extremity deficit.  Further work-up in the ED showed tumor in the right deep gray matter with extensive vasogenic edema and possible lesion in the right cerebellum.  Neurosurgery consulted, patient is status post brain biopsy with Dr. 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  Awaiting SNF disposition.  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 twice daily.   Continue seizure precautions Neurooncology evaluated and unfortunately her status remains poor despite high-dose corticosteroids and she is not independent with ADLs at this time.  Neurooncology recommends palliative care consultation and continuing steroids and supportive care but if her clinical status improves then they can consider proceeding with staging and treatment but overall the prognosis has been discussed with the patient's daughter by the neuro oncologist. Palliative care has been consulted for further goals of care discussion given that her prognosis is extremely poor and that she has not made very much progress but family continues to  remain interested in pursuing full code Awaiting SNF   Hyperlipidemia. Continue with Simvastatin 20 mg daily   Essential Hypertension. Continue to hold antihypertensives for now.   Continue to monitor blood pressure protocol Patient is on HCTZ and amlodipine at home   History of Trigeminal Neuralgia.   Patient takes carbamazepine at home and this has been resumed   Hyponatremia Daily BMP   Leukocytosis In the setting of steroid demargination given that she is on dexamethasone    DVT prophylaxis: Place and maintain sequential compression device Start: 05/17/22 0749 SCDs Start: 05/16/22 1718 SCDs Start: 05/13/22 1051    Code Status: Full Code Family Communication:   Disposition Plan:  Level of care: Progressive Status is: Inpatient Remains inpatient appropriate because: Needs SNF and insurance authorization as well as bed availability given that she is no longer a candidate for CIR   Consultants:  Neurosurgery Neuro-oncology Palliative care medicine  Procedures:  CT head 05/13/2022, 05/17/2022 CT chest abdomen and pelvis 05/15/2022 MRI brain 05/13/2022 CT BrainLab head 05/14/2022 Stereotactic brain biopsy application of cranial navigation procedure note per Dr. Marcello Moores neurosurgery 05/16/2022  Antimicrobials:  Anti-infectives (From admission, onward)    Start     Dose/Rate Route Frequency Ordered Stop   05/16/22 2000  ceFAZolin (ANCEF) IVPB 1 g/50 mL premix        1 g 100 mL/hr over 30 Minutes Intravenous Every 8 hours 05/16/22 1717 05/17/22 1445   05/16/22 0745  ceFAZolin (ANCEF) IVPB 2g/100 mL premix        2 g 200 mL/hr over 30 Minutes Intravenous On call to O.R. 05/16/22 9562 05/16/22 1210       Subjective: Patient seen and examined at  bedside.  Denies any new complaints.  Objective: Vitals:   05/30/22 2314 05/31/22 0309 05/31/22 0751 05/31/22 1145  BP: (!) 143/80 129/74 106/67 (!) 146/73  Pulse:  66 65 (!) 53  Resp: '15 17 12 15  '$ Temp: 98.2 F (36.8 C) 98.3 F  (36.8 C) 97.6 F (36.4 C) 98.2 F (36.8 C)  TempSrc: Oral Oral Oral Oral  SpO2:  98% 99% 100%  Weight:      Height:        Intake/Output Summary (Last 24 hours) at 05/31/2022 1824 Last data filed at 05/31/2022 0814 Gross per 24 hour  Intake 1767.68 ml  Output 300 ml  Net 1467.68 ml   Filed Weights   05/28/22 0500 05/29/22 0303 05/30/22 0440  Weight: 60.3 kg 60.1 kg 58.6 kg   Examination: Physical Exam:  General: NAD, chronically ill-appearing, appears withdrawn Cardiovascular: S1, S2 present Respiratory: CTAB Abdomen: Soft, nontender, nondistended, bowel sounds present Musculoskeletal: No bilateral pedal edema noted Skin: Normal Psychiatry: Poor mood Neurology: Noted left-sided upper and lower extremity weakness     Data Reviewed: I have personally reviewed following labs and imaging studies  CBC: Recent Labs  Lab 05/27/22 0248 05/28/22 0210 05/29/22 0008 05/30/22 0157 05/31/22 0420  WBC 15.8* 15.3* 15.1* 15.5* 14.4*  NEUTROABS 13.6* 13.6* 13.2* 13.5* 12.9*  HGB 14.0 14.7 14.0 13.8 12.7  HCT 40.1 43.7 42.5 41.7 38.2  MCV 76.8* 78.9* 79.6* 80.0 79.4*  PLT 249 265 269 235 481   Basic Metabolic Panel: Recent Labs  Lab 05/26/22 0231 05/27/22 0248 05/28/22 0210 05/29/22 0008 05/30/22 0157 05/31/22 0420  NA 136 134* 133* 131* 132* 133*  K 4.0 4.2 4.2 4.5 4.3 4.0  CL 99 97* 96* 94* 96* 98  CO2 '27 25 26 28 24 25  '$ GLUCOSE 138* 118* 127* 135* 133* 128*  BUN '16 18 15 19 17 14  '$ CREATININE 0.76 0.71 0.66 0.70 0.57 0.69  CALCIUM 9.3 9.3 9.6 9.4 9.2 9.0  MG 2.1 2.2 2.3 2.4 2.1  --   PHOS 3.5 3.1 3.4 3.6 3.4  --    GFR: Estimated Creatinine Clearance: 58 mL/min (by C-G formula based on SCr of 0.69 mg/dL). Liver Function Tests: Recent Labs  Lab 05/26/22 0231 05/27/22 0248 05/28/22 0210 05/29/22 0008 05/30/22 0157  AST '28 25 26 26 18  '$ ALT 57* 60* 64* 55* 51*  ALKPHOS 50 51 55 60 56  BILITOT 0.4 0.5 0.7 0.8 0.5  PROT 6.4* 6.4* 6.6 6.3* 6.0*  ALBUMIN  3.4* 3.5 3.5 3.5 3.3*   No results for input(s): "LIPASE", "AMYLASE" in the last 168 hours. No results for input(s): "AMMONIA" in the last 168 hours. Coagulation Profile: No results for input(s): "INR", "PROTIME" in the last 168 hours. Cardiac Enzymes: No results for input(s): "CKTOTAL", "CKMB", "CKMBINDEX", "TROPONINI" in the last 168 hours. BNP (last 3 results) No results for input(s): "PROBNP" in the last 8760 hours. HbA1C: No results for input(s): "HGBA1C" in the last 72 hours. CBG: Recent Labs  Lab 05/28/22 0712 05/28/22 1211 05/29/22 0716 05/29/22 1145 05/30/22 0813  GLUCAP 130* 128* 110* 141* 125*   Lipid Profile: No results for input(s): "CHOL", "HDL", "LDLCALC", "TRIG", "CHOLHDL", "LDLDIRECT" in the last 72 hours. Thyroid Function Tests: No results for input(s): "TSH", "T4TOTAL", "FREET4", "T3FREE", "THYROIDAB" in the last 72 hours. Anemia Panel: No results for input(s): "VITAMINB12", "FOLATE", "FERRITIN", "TIBC", "IRON", "RETICCTPCT" in the last 72 hours. Sepsis Labs: No results for input(s): "PROCALCITON", "LATICACIDVEN" in the last 168 hours.  No results found for this or any previous visit (from the past 240 hour(s)).   Radiology Studies: No results found.   Scheduled Meds:  amLODipine  2.5 mg Oral Daily   carbamazepine  200 mg Oral BID   dexamethasone (DECADRON) injection  6 mg Intravenous Q6H   levETIRAcetam  500 mg Oral BID   pantoprazole  40 mg Oral Daily   simvastatin  20 mg Oral Daily   Continuous Infusions:  sodium chloride 75 mL/hr at 05/30/22 1744    LOS: 18 days   Alma Friendly, MD [facility] Available via Epic secure chat 7am-7pm After these hours, please refer to coverage provider listed on [email or web address] 05/31/2022, 6:24 PM

## 2022-05-31 NOTE — Progress Notes (Signed)
[facility] Hospital Liaison Note  Notified by TOC manager of patient/family request for [facility] palliative services at home after discharge.   [facility] hospital liaison will follow patient for discharge disposition.   Please call with any hospice or outpatient palliative care related questions.   Thank you for the opportunity to participate in this patient's care.   Shanita Wicker, LCSW [facility] Hospital Liaison [phone]  

## 2022-05-31 NOTE — Progress Notes (Signed)
Daily Progress Note   Patient Name: April Saunders       Date: 05/31/2022 DOB: [birth year]  Age: 66 y.o. MRN#: [id number] Attending Physician: Alma Friendly, MD Primary Care Physician: Willene Hatchet, NP Admit Date: 05/13/2022  Reason for Consultation/Follow-up: Establishing goals of care  Patient Profile/HPI: 66 y.o. female  with past medical history of hypertension hyperlipidemia degenerative joint disease obesity admitted on 05/13/2022 with fatigue and weakness ongoing for 3 to 4 weeks with difficulty balancing herself and falls.  CT of the brain showed lesions concerning for malignancy.  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  She has been started on steroids and Keppra.  Was seen by neuro-oncology.  Their current recommendation is to continue steroids and supportive care, however her clinical functional status is poor and she is not a current candidate for treatment planning.  Palliative medicine consulted for goals of care.  She has been denied CIR due to no family available to care for her after rehab.  Family is now seeking SNF rehab placement.  Subjective: Chart reviewed including labs, progress notes , imaging.  Noted she is participating with PT and OT.  Per chart review she ate 100% of her breakfast. [name] woke easily to my voice but keeps her eyes closed.  She denied any complaints of pain or other symptoms. There was no family at bedside Review of Systems  Unable to perform ROS: Mental status change     Physical Exam Vitals and nursing note reviewed.  Constitutional:      Appearance: She is ill-appearing.  Cardiovascular:     Rate and Rhythm: Normal rate.  Pulmonary:     Effort: Pulmonary effort is normal.  Skin:    General: Skin is  warm and dry.     Coloration: Skin is pale.  Neurological:     Mental Status: She is disoriented.     Comments: Lethargic but does arouse to my voice and with some stimulation she is able to answer some questions             Vital Signs: BP (!) 146/73 (BP Location: Left Arm)   Pulse (!) 53   Temp 98.2 F (36.8 C) (Oral)   Resp 15   Ht '5\' 3"'$  (1.6 m)   Wt 58.6 kg  SpO2 100%   BMI 22.88 kg/m  SpO2: SpO2: 100 % O2 Device: O2 Device: Room Air O2 Flow Rate:    Intake/output summary:  Intake/Output Summary (Last 24 hours) at 05/31/2022 1549 Last data filed at 05/31/2022 9480 Gross per 24 hour  Intake 1767.68 ml  Output 300 ml  Net 1467.68 ml    LBM: Last BM Date : 05/30/22 Baseline Weight: Weight: 64.4 kg Most recent weight: Weight: 58.6 kg       Palliative Assessment/Data: PPS 30%      Patient Active Problem List   Diagnosis Date Noted   Pressure injury of skin 05/29/2022   Advanced care planning/counseling discussion    Goals of care, counseling/discussion    Palliative care by specialist    Hypokalemia 05/15/2022   Brain tumor ([facility]) 05/13/2022   Trigeminal neuralgia 07/05/2021   Prediabetes 07/06/2020   Diverticulosis 07/06/2020   Dyslipidemia 12/30/2018   Hypertension    Obesity     Palliative Care Assessment & Plan    Assessment/Recommendations/Plan  Continue current plan of care for now Family is agreeable to pursuing SNF rehab placement in hopes that patient will gain ability to be able to undergo treatment for her cancer Full CODE STATUS They are agreeable to outpatient palliative follow-up PMT will follow peripherally for now and reengage if needed   Code Status: Full code  Prognosis:  Unable to determine  Discharge Planning: To Be Determined    Thank you for allowing the Palliative Medicine Team to assist in the care of this patient.    Greater than 50%  of this time was spent counseling and coordinating care related to the above  assessment and plan.  Mariana Kaufman, AGNP-C Palliative Medicine   Please contact Palliative Medicine Team phone at [phone] for questions and concerns.

## 2022-05-31 NOTE — Progress Notes (Signed)
Physical Therapy Treatment Patient Details Name: April Saunders MRN: [id number] DOB: [birth year] Today's Date: 05/31/2022   History of Present Illness The pt is a 66 yo female presenting 7/2 with increased weakness and falls in last 3 weeks. CT revealed a 2.5 cm hypercellular mass with extensive vasogenic edema centered at the right deep gray nuclei with possible 2nd lesion in the right cerebellum. Pt now s/p stereotactic brain biopsy on 7/5. PMH includes: DJD, HLD, HTN, and trigenimal neuralgia.    PT Comments    Patient limited with arousal and attention and unsafe in recliner due to scooting to lie down and sitting on footrest.  She remains incontinent of urine and bed linen changed despite purewick.  She was too lethargic to attempt to focus on eating and would need family or sitter in the room for safety when OOB.  PT will continue to follow.  Remains appropriate for SNF level rehab at d/c.    Recommendations for follow up therapy are one component of a multi-disciplinary discharge planning process, led by the attending physician.  Recommendations may be updated based on patient status, additional functional criteria and insurance authorization.  Follow Up Recommendations  Skilled nursing-short term rehab (<3 hours/day) Can patient physically be transported by private vehicle: No   Assistance Recommended at Discharge Frequent or constant Supervision/Assistance  Patient can return home with the following A lot of help with bathing/dressing/bathroom;Assistance with cooking/housework;Assistance with feeding;Direct supervision/assist for medications management;Direct supervision/assist for financial management;Assist for transportation;Help with stairs or ramp for entrance;Two people to help with walking and/or transfers   Equipment Recommendations  Wheelchair (measurements PT);Wheelchair cushion (measurements PT)    Recommendations for Other Services       Precautions / Restrictions  Precautions Precautions: Fall Precaution Comments: incontinent of urine; L UE spasticity     Mobility  Bed Mobility Overal bed mobility: Needs Assistance Bed Mobility: Supine to Sit, Sit to Supine     Supine to sit: Max assist Sit to supine: Min assist   General bed mobility comments: assist for lifting trunk and repositioning hips as legs already off bed and pt close to EOB on R side; to supine assist for positioning as pt laying on L side curled into flexion and close to L side of bed    Transfers Overall transfer level: Needs assistance   Transfers: Sit to/from Stand, Bed to chair/wheelchair/BSC Sit to Stand: +2 physical assistance, Mod assist Stand pivot transfers: Mod assist, +2 physical assistance         General transfer comment: assist for initiating up to stand but pt assisting once initiated.  Assist to step to recliner due to L LE buckling then back to bed due to unsafe in chair    Ambulation/Gait                   Stairs             Wheelchair Mobility    Modified Rankin (Stroke Patients Only)       Balance Overall balance assessment: Needs assistance Sitting-balance support: Feet supported Sitting balance-Leahy Scale: Poor Sitting balance - Comments: initially leaning R needing min A then improved to S after finally opening eyes     Standing balance-Leahy Scale: Poor Standing balance comment: Dependent on external support in standing, narrow BoS.                            Cognition Arousal/Alertness: Lethargic Behavior During Therapy:  Impulsive, Restless Overall Cognitive Status: Impaired/Different from baseline Area of Impairment: Orientation, Attention, Memory, Following commands                 Orientation Level: Disoriented to, Situation, Time Current Attention Level: Focused Memory: Decreased short-term memory Following Commands: Follows one step commands inconsistently, Follows one step commands with  increased time Safety/Judgement: Decreased awareness of safety, Decreased awareness of deficits   Problem Solving: Slow processing, Decreased initiation, Difficulty sequencing, Requires verbal cues, Requires tactile cues          Exercises      General Comments General comments (skin integrity, edema, etc.): Attempted to engage in lunch, but pt too lethargic      Pertinent Vitals/Pain Pain Assessment Faces Pain Scale: Hurts little more Pain Location: L hip Pain Descriptors / Indicators: Grimacing, Discomfort Pain Intervention(s): Repositioned, Limited activity within patient's tolerance    Home Living                          Prior Function            PT Goals (current goals can now be found in the care plan section) Progress towards PT goals: Not progressing toward goals - comment    Frequency    Min 3X/week      PT Plan Current plan remains appropriate    Co-evaluation              AM-PAC PT "6 Clicks" Mobility   Outcome Measure  Help needed turning from your back to your side while in a flat bed without using bedrails?: Total Help needed moving from lying on your back to sitting on the side of a flat bed without using bedrails?: Total Help needed moving to and from a bed to a chair (including a wheelchair)?: Total Help needed standing up from a chair using your arms (e.g., wheelchair or bedside chair)?: Total Help needed to walk in hospital room?: Total Help needed climbing 3-5 steps with a railing? : Total 6 Click Score: 6    End of Session Equipment Utilized During Treatment: Gait belt Activity Tolerance: Patient limited by lethargy Patient left: in bed;with call bell/phone within reach;with bed alarm set   PT Visit Diagnosis: Other abnormalities of gait and mobility (R26.89);Muscle weakness (generalized) (M62.81);Difficulty in walking, not elsewhere classified (R26.2)     Time: 3094-0768 PT Time Calculation (min) (ACUTE ONLY): 32  min  Charges:  $Therapeutic Activity: 23-37 mins                     Magda Kiel, PT Acute Rehabilitation Services Office:[phone] 05/31/2022    Reginia Naas 05/31/2022, 2:16 PM

## 2022-06-01 DIAGNOSIS — D496 Neoplasm of unspecified behavior of brain: Secondary | ICD-10-CM | POA: Diagnosis not present

## 2022-06-01 DIAGNOSIS — E876 Hypokalemia: Secondary | ICD-10-CM | POA: Diagnosis not present

## 2022-06-01 LAB — BASIC METABOLIC PANEL
Anion gap: 9 (ref 5–15)
BUN: 16 mg/dL (ref 8–23)
CO2: 27 mmol/L (ref 22–32)
Calcium: 9 mg/dL (ref 8.9–10.3)
Chloride: 98 mmol/L (ref 98–111)
Creatinine, Ser: 0.62 mg/dL (ref 0.44–1.00)
GFR, Estimated: >60 mL/min (ref >60)
Glucose, Bld: 112 mg/dL — ABNORMAL HIGH (ref 70–99)
Potassium: 4.2 mmol/L (ref 3.5–5.1)
Sodium: 134 mmol/L — ABNORMAL LOW (ref 135–145)

## 2022-06-01 LAB — CBC WITH DIFFERENTIAL/PLATELET
Abs Immature Granulocytes: 0.05 10*3/uL (ref 0.00–0.07)
Basophils Absolute: 0 10*3/uL (ref 0.0–0.1)
Basophils Relative: 0 %
Eosinophils Absolute: 0 10*3/uL (ref 0.0–0.5)
Eosinophils Relative: 0 %
HCT: 38.3 % (ref 36.0–46.0)
Hemoglobin: 12.8 g/dL (ref 12.0–15.0)
Immature Granulocytes: 1 %
Lymphocytes Relative: 10 %
Lymphs Abs: 1 10*3/uL (ref 0.7–4.0)
MCH: 26.5 pg (ref 26.0–34.0)
MCHC: 33.4 g/dL (ref 30.0–36.0)
MCV: 79.3 fL — ABNORMAL LOW (ref 80.0–100.0)
Monocytes Absolute: 0.5 10*3/uL (ref 0.1–1.0)
Monocytes Relative: 4 %
Neutro Abs: 9 10*3/uL — ABNORMAL HIGH (ref 1.7–7.7)
Neutrophils Relative %: 85 %
Platelets: 247 10*3/uL (ref 150–400)
RBC: 4.83 MIL/uL (ref 3.87–5.11)
RDW: 13.7 % (ref 11.5–15.5)
WBC: 10.5 10*3/uL (ref 4.0–10.5)
nRBC: 0 % (ref 0.0–0.2)

## 2022-06-01 MED ORDER — DEXAMETHASONE 6 MG PO TABS: 6.0000 mg | ORAL_TABLET | Freq: Four times a day (QID) | ORAL | Status: DC

## 2022-06-01 MED ORDER — LEVETIRACETAM 500 MG PO TABS: 500.0000 mg | ORAL_TABLET | Freq: Two times a day (BID) | ORAL | Status: DC

## 2022-06-01 MED ORDER — AMLODIPINE BESYLATE 5 MG PO TABS: 2.5000 mg | ORAL_TABLET | Freq: Every day | ORAL | 0 refills | Status: DC

## 2022-06-01 MED ORDER — PANTOPRAZOLE SODIUM 40 MG PO TBEC: 40.0000 mg | DELAYED_RELEASE_TABLET | Freq: Every day | ORAL | Status: DC

## 2022-06-01 NOTE — Progress Notes (Signed)
Occupational Therapy Treatment Patient Details Name: April Saunders MRN: [id number] DOB: June 13, 1956 Today's Date: 06/01/2022   History of present illness The pt is a 66 yo female presenting 7/2 with increased weakness and falls in last 3 weeks. CT revealed a 2.5 cm hypercellular mass with extensive vasogenic edema centered at the right deep gray nuclei with possible 2nd lesion in the right cerebellum. Pt now s/p stereotactic brain biopsy on 7/5. PMH includes: DJD, HLD, HTN, and trigenimal neuralgia.   OT comments  Patient continues to be lethargic, eyes closed, not following commands consistently, and not participating.  Attempted change of position in bed for attempted self feeding.  Patient continued to roll back to side and not stay in a position conducive to safely eating.  Patient would answer yes, when questioned about eating, but never open her eyes or participate.  Patient left side lying, with bed alarm on.  OT will reassess ability to participate next session if she remains at [facility], otherwise would anticipate LTC at [facility].     Recommendations for follow up therapy are one component of a multi-disciplinary discharge planning process, led by the attending physician.  Recommendations may be updated based on patient status, additional functional criteria and insurance authorization.    Follow Up Recommendations  Skilled nursing-short term rehab (<3 hours/day)    Assistance Recommended at Discharge Frequent or constant Supervision/Assistance  Patient can return home with the following  Two people to help with walking and/or transfers;A lot of help with bathing/dressing/bathroom;Direct supervision/assist for medications management;Direct supervision/assist for financial management;Assist for transportation;Assistance with cooking/housework;Help with stairs or ramp for entrance;Assistance with feeding   Equipment Recommendations  Wheelchair (measurements OT);Wheelchair cushion (measurements  OT);Hospital bed    Recommendations for Other Services      Precautions / Restrictions Precautions Precautions: Fall Precaution Comments: incontinent of urine; L UE spasticity       Mobility Bed Mobility Overal bed mobility: Needs Assistance Bed Mobility: Rolling Rolling: Max assist              Transfers                         Balance                                           ADL either performed or assessed with clinical judgement   ADL   Eating/Feeding: Maximal assistance;Sitting                                                                          Cognition Arousal/Alertness: Lethargic Behavior During Therapy: Flat affect Overall Cognitive Status: Difficult to assess                                                             Pertinent Vitals/ Pain       Pain Assessment Pain Assessment: Faces Faces Pain Scale: Hurts little  more Pain Location: L hip Pain Descriptors / Indicators: Grimacing Pain Intervention(s): Monitored during session                                                          Frequency  Min 2X/week        Progress Toward Goals  OT Goals(current goals can now be found in the care plan section)  Progress towards OT goals: Not progressing toward goals - comment  Acute Rehab OT Goals OT Goal Formulation: Patient unable to participate in goal setting Time For Goal Achievement: 06/15/22 Potential to Achieve Goals: Fair ADL Goals Pt Will Perform Lower Body Dressing: with mod assist;sitting/lateral leans Pt Will Transfer to Toilet: with mod assist;stand pivot transfer;bedside commode  Plan Discharge plan remains appropriate    Co-evaluation                 AM-PAC OT "6 Clicks" Daily Activity     Outcome Measure   Help from another person eating meals?: A Lot Help from another person taking care of personal  grooming?: A Lot Help from another person toileting, which includes using toliet, bedpan, or urinal?: Total Help from another person bathing (including washing, rinsing, drying)?: A Lot Help from another person to put on and taking off regular upper body clothing?: A Lot Help from another person to put on and taking off regular lower body clothing?: A Lot 6 Click Score: 11    End of Session    OT Visit Diagnosis: Unsteadiness on feet (R26.81);Muscle weakness (generalized) (M62.81);Ataxia, unspecified (R27.0);Other symptoms and signs involving cognitive function;Hemiplegia and hemiparesis;Low vision, both eyes (H54.2) Hemiplegia - Right/Left: Left Hemiplegia - dominant/non-dominant: Non-Dominant Hemiplegia - caused by: Other cerebrovascular disease   Activity Tolerance Patient limited by lethargy   Patient Left in bed;with call bell/phone within reach;with bed alarm set   Nurse Communication Mobility status        Time: 1437-1450 OT Time Calculation (min): 13 min  Charges: OT General Charges $OT Visit: 1 Visit OT Treatments $Self Care/Home Management : 8-22 mins  06/01/2022  RP, OTR/L  Acute Rehabilitation Services  Office:  [phone]   Metta Clines 06/01/2022, 2:59 PM

## 2022-06-01 NOTE — TOC Transition Note (Signed)
Transition of Care [facility]) - CM/SW Discharge Note   Patient Details  Name: April Saunders MRN: [id number] Date of Birth: 1956-04-30  [facility] CM/SW Contact:  Vinie Sill, LCSW Phone Number: 06/01/2022, 2:17 PM   Clinical Narrative:     Patient will Discharge to: [place] Discharge Date: 06/01/2022 Family Notified: daughter,Felicia Transport SE:GBTD  Per MD patient is ready for discharge. RN, patient, and facility notified of discharge. Discharge Summary sent to facility. RN given number for [phone]. Ambulance transport requested for patient.   Clinical Social Worker signing off.  Thurmond Butts, MSW, LCSW Clinical Social Worker     Final next level of care: Skilled Nursing Facility Barriers to Discharge: Barriers Resolved   Patient Goals and CMS Choice        Discharge Placement              Patient chooses bed at: [facility] Patient to be transferred to facility by: [place] Name of family member notified: daughter,Felica Patient and family notified of of transfer: 06/01/22  Discharge Plan and Services In-house Referral: Clinical Social Work                                   Social Determinants of Health (SDOH) Interventions     Readmission Risk Interventions    05/31/2022    9:28 AM  Readmission Risk Prevention Plan  Transportation Screening Complete  PCP or Specialist Appt within 5-7 Days Complete  Home Care Screening Complete  Medication Review (RN CM) Referral to Pharmacy

## 2022-06-01 NOTE — Discharge Summary (Signed)
Physician Discharge Summary   Patient: Monseratt Ledin MRN: [id number] DOB: 1956/02/13  Admit date:     05/13/2022  Discharge date: 06/01/22  Discharge Physician: Alma Friendly   PCP: Willene Hatchet, NP   Recommendations at discharge:   Follow-up with PCP in 1 week Follow-up with neuro-oncology as scheduled  Discharge Diagnoses: Principal Problem:   Brain tumor [facility]) Active Problems:   Hypertension   Dyslipidemia   Hypokalemia   Advanced care planning/counseling discussion   Goals of care, counseling/discussion   Palliative care by specialist   Pressure injury of skin    Hospital Course: Keegan Zanders is a 66 y.o. female with medical history significant of hypertension, hyperlipidemia, degenerative joint disease, obesity presented hospital with fatigue, weakness for 3 to 4 weeks with difficulty balancing himself  and falls.  In the ED, patient had left upper extremity deficit.  Further work-up in the ED showed tumor in the right deep gray matter with extensive vasogenic edema and possible lesion in the right cerebellum.  Neurosurgery consulted, patient is status post brain biopsy with Dr. 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  Awaiting SNF disposition.  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 twice daily.   Continue seizure precautions Neurooncology evaluated and unfortunately her status remains poor despite high-dose corticosteroids and she is not independent with ADLs at this time.  Neurooncology recommends palliative care consultation and continuing steroids and  supportive care but if her clinical status improves then they can consider proceeding with staging and treatment but overall the prognosis has been discussed with the patient's daughter by the neuro oncologist. Palliative care has been consulted for further goals of care discussion given that her prognosis is extremely poor and that she has not made very much progress but family continues to remain interested in pursuing full code   Hyperlipidemia. Continue with Simvastatin 20 mg daily   Essential Hypertension.  Continue amlodipine, hold hydrochlorothiazide pending BP   History of Trigeminal Neuralgia.   Continue carbamazepine   Hyponatremia   Leukocytosis In the setting of steroid demargination given that she is on dexamethasone         Pain control - [facility] Controlled Substance Reporting System database was reviewed. and patient was instructed, not to drive, operate heavy machinery, perform activities at heights, swimming or participation in water activities or provide baby-sitting services while on Pain, Sleep and Anxiety Medications; until their outpatient Physician has advised to do so again. Also recommended to not to take more than prescribed Pain, Sleep and Anxiety Medications.    Consultants: Neuro-oncology, palliative care, neurosurgery Procedures performed: Brain biopsy Disposition: Skilled nursing facility Diet recommendation:  Regular diet   DISCHARGE MEDICATION: Allergies as of 06/01/2022       Reactions   Atorvastatin Calcium Itching   Chicken Allergy Nausea And Vomiting   Lipitor [atorvastatin] Other (See Comments)   Weakness   Lisinopril Other (See Comments)   Shoulder pain   Pork-derived Products Nausea And Vomiting        Medication List     STOP taking these medications    cephALEXin 500 MG capsule Commonly  known as: KEFLEX   hydrochlorothiazide 12.5 MG tablet Commonly known as: HYDRODIURIL   potassium chloride SA 20 MEQ  tablet Commonly known as: KLOR-CON M   Vitamin D-3 125 MCG (5000 UT) Tabs       TAKE these medications    acetaminophen 500 MG tablet Commonly known as: TYLENOL Take 500 mg by mouth every 6 (six) hours as needed for mild pain or headache. What changed: Another medication with the same name was removed. Continue taking this medication, and follow the directions you see here.   amLODipine 5 MG tablet Commonly known as: NORVASC Take 0.5 tablets (2.5 mg total) by mouth daily. What changed: how much to take   carbamazepine 200 MG tablet Commonly known as: TEGretol Take 1 tablet (200 mg total) by mouth 2 (two) times daily.   Cod Liver Oil Caps Take 1 capsule by mouth daily.   dexamethasone 6 MG tablet Commonly known as: DECADRON Take 1 tablet (6 mg total) by mouth 4 (four) times daily.   ferrous sulfate 325 (65 FE) MG tablet Take 325 mg by mouth daily with breakfast.   fish oil-omega-3 fatty acids 1000 MG capsule Take 1 g by mouth daily.   levETIRAcetam 500 MG tablet Commonly known as: KEPPRA Take 1 tablet (500 mg total) by mouth 2 (two) times daily.   pantoprazole 40 MG tablet Commonly known as: PROTONIX Take 1 tablet (40 mg total) by mouth daily. Start taking on: June 02, 2022   simvastatin 20 MG tablet Commonly known as: ZOCOR Take 1 tablet (20 mg total) by mouth daily. What changed: when to take this   Vitamin D (Ergocalciferol) 1.25 MG (50000 UNIT) Caps capsule Commonly known as: DRISDOL Take 50,000 Units by mouth every Sunday.        Contact information for follow-up providers     Willene Hatchet, NP. Schedule an appointment as soon as possible for a visit in 1 week(s).   Specialty: Nurse Practitioner Contact information: [place] [address] [phone]         Ventura Sellers, MD Follow up.   Specialties: Psychiatry, Neurology, Oncology Why: Will arrange follow up Contact information: [place] [address] [phone]              Contact information for after-discharge care     [facility] .   Service: Skilled Nursing Contact information: [address] [place] [phone]                    Discharge Exam: Danley Danker Weights   05/29/22 0303 05/30/22 0440 06/01/22 0500  Weight: 60.1 kg 58.6 kg 57.8 kg   General: NAD, chronically ill-appearing Cardiovascular: S1, S2 present Respiratory: CTAB Abdomen: Soft, nontender, nondistended, bowel sounds present Musculoskeletal: No bilateral pedal edema noted Skin: Normal Psychiatry: Poor mood  Neurology: Noted left-sided upper and lower extremity weakness  Condition at discharge: fair  The results of significant diagnostics from this hospitalization (including imaging, microbiology, ancillary and laboratory) are listed below for reference.   Imaging Studies: CT HEAD WO CONTRAST (5MM)  Result Date: 05/22/2022 CLINICAL DATA:  Recent diagnosis of B-cell lymphoma. Prior biopsy. Mental status changes. EXAM: CT HEAD WITHOUT CONTRAST TECHNIQUE: Contiguous axial images were obtained from the base of the skull through the vertex without intravenous contrast. RADIATION DOSE REDUCTION: This exam was performed according to the departmental dose-optimization program which  includes automated exposure control, adjustment of the mA and/or kV according to patient size and/or use of iterative reconstruction technique. COMPARISON:  05/17/2022 FINDINGS: Brain: Today's study suffers from considerable motion degradation. There is resolution of the previously seen edema in the right cerebellum. Mass lesion at the inferior basal ganglia on the right with intrinsic hyperdensity measures 2.6 cm in diameter. No evidence of hemorrhagic complication post biopsy. Regional edema persists relating to this lesion. Previously see lesion of the left temporal lobe is not  discernible. Right-to-left shift is approximately 7 mm. No evidence of obstructive hydrocephalus. Vascular: No vascular finding. Skull: Negative otherwise. Sinuses/Orbits: Clear/normal Other: None IMPRESSION: Marked motion degradation. No additional complication evident following biopsy of the inferior basal ganglia lesion on the right. Maximal measurement today is 2.6 cm. The study is markedly degraded by motion and assessment for subtle change is not possible. Regional edema on the right causes mass effect with right-to-left shift estimated at 7 mm, similar to the prior exam. No visible edema in the inferior cerebellum on the right or in the left temporal lobe. Electronically Signed   By: Nelson Chimes M.D.   On: 05/22/2022 12:59   CT HEAD WO CONTRAST  Result Date: 05/17/2022 CLINICAL DATA:  CNS neoplasm follow-up EXAM: CT HEAD WITHOUT CONTRAST TECHNIQUE: Contiguous axial images were obtained from the base of the skull through the vertex without intravenous contrast. RADIATION DOSE REDUCTION: This exam was performed according to the departmental dose-optimization program which includes automated exposure control, adjustment of the mA and/or kV according to patient size and/or use of iterative reconstruction technique. COMPARISON:  Head CT from 3 days ago FINDINGS: Brain: 2.3 cm high-density mass centered at the right basal ganglia with expected superimposed blood products and gas after biopsy. Continued profound regional edema with midline shift measuring 7 mm, increased. There may be developing entrapment of the left lateral ventricle, but no measurable change from prior CT. No cortical infarct or extra-axial collection noted. Underestimated parenchymal masses when compared to prior postcontrast imaging. Vascular: No hyperdense vessel or unexpected calcification. Skull: Normal. Negative for fracture or focal lesion. Sinuses/Orbits: No acute finding. IMPRESSION: Expected blood products and gas around the right  basal ganglia mass after biopsy. Continued pronounced vasogenic edema, midline shift has increased to 7 mm. Electronically Signed   By: Jorje Guild M.D.   On: 05/17/2022 05:34   CT CHEST ABDOMEN PELVIS W CONTRAST  Result Date: 05/15/2022 CLINICAL DATA:  Brain neoplasm. Question lymphoma. Staging. * Tracking Code: BO * EXAM: CT CHEST, ABDOMEN, AND PELVIS WITH CONTRAST TECHNIQUE: Multidetector CT imaging of the chest, abdomen and pelvis was performed following the standard protocol during bolus administration of intravenous contrast. RADIATION DOSE REDUCTION: This exam was performed according to the departmental dose-optimization program which includes automated exposure control, adjustment of the mA and/or kV according to patient size and/or use of iterative reconstruction technique. CONTRAST:  69m OMNIPAQUE IOHEXOL 300 MG/ML  SOLN COMPARISON:  None Available. FINDINGS: CT CHEST FINDINGS Cardiovascular: The heart size is normal. No substantial pericardial effusion. Mediastinum/Nodes: No mediastinal lymphadenopathy. There is no hilar lymphadenopathy. The esophagus has normal imaging features. There is no axillary lymphadenopathy. Lungs/Pleura: 2 mm right lower lobe pulmonary nodule identified on 59/4. No suspicious pulmonary nodule or mass. No focal airspace consolidation. No pleural effusion. Dependent atelectasis noted in both lower lobes. Musculoskeletal: No worrisome lytic or sclerotic osseous abnormality. CT ABDOMEN PELVIS FINDINGS Hepatobiliary: 8 mm hypodensity in the left liver is too small to characterize but likely benign.  There is no evidence for gallstones, gallbladder wall thickening, or pericholecystic fluid. No intrahepatic or extrahepatic biliary dilation. Pancreas: No focal mass lesion. No dilatation of the main duct. No intraparenchymal cyst. No peripancreatic edema. Spleen: No splenomegaly. No focal mass lesion. Adrenals/Urinary Tract: No adrenal nodule or mass. Kidneys unremarkable. No  evidence for hydroureter. The urinary bladder appears normal for the degree of distention. Dependent contrast in the bladder lumen compatible with infused head CT yesterday. Stomach/Bowel: Stomach is distended with fluid and gas. Duodenum is normally positioned as is the ligament of Treitz. No small bowel wall thickening. No small bowel dilatation. The terminal ileum is normal. The appendix is normal. No gross colonic mass. No colonic wall thickening. Vascular/Lymphatic: No abdominal aortic aneurysm. No abdominal aortic atherosclerotic calcification. There is no gastrohepatic or hepatoduodenal ligament lymphadenopathy. No retroperitoneal or mesenteric lymphadenopathy. No pelvic sidewall lymphadenopathy. Reproductive: Unremarkable. Other: No intraperitoneal free fluid. Musculoskeletal: No worrisome lytic or sclerotic osseous abnormality. IMPRESSION: 1. No lymphadenopathy or other findings to suggest lymphoma in the chest, abdomen, or pelvis. 2. 2 mm right lower lobe pulmonary nodule. No follow-up needed if patient is low-risk.This recommendation follows the consensus statement: Guidelines for Management of Incidental Pulmonary Nodules Detected on CT Images: From the Fleischner Society 2017; Radiology 2017; 284:228-243. 3. 8 mm hypodensity in the left liver is too small to characterize but likely benign. Electronically Signed   By: Misty Stanley M.D.   On: 05/15/2022 12:42   CT BRAINLAB HEAD WO/W CONTRAST (1MM)  Result Date: 05/14/2022 CLINICAL DATA:  Brain tumor.  Stealth protocol. EXAM: CT HEAD WITHOUT AND WITH CONTRAST TECHNIQUE: Contiguous axial images were obtained from the base of the skull through the vertex without and with intravenous contrast. RADIATION DOSE REDUCTION: This exam was performed according to the departmental dose-optimization program which includes automated exposure control, adjustment of the mA and/or kV according to patient size and/or use of iterative reconstruction technique. CONTRAST:   28m OMNIPAQUE IOHEXOL 300 MG/ML  SOLN COMPARISON:  CT and MRI yesterday. FINDINGS: Brain: Enhancing 11 mm lesion within the right cerebellum with surrounding edema. Enhancing 2.3 cm in diameter mass with the epicenter at the inferior right basal ganglia with extensive surrounding edema. Indistinct enhancing mass of the posterior mesial temporal lobe on the left, unable to accurately measure by CT. Mild associated edema. Subtle foci of enhancement of the dorsal mid brain and right corona radiata, better shown by MRI. Edema associated with the dominant right basal ganglia lesion is responsible for mass effect and right to left midline shift of 3 mm. No ventricular trapping. No extra-axial fluid collection. Vascular: No primary vascular lesion. Skull: Normal Sinuses/Orbits: Clear/normal Other: None IMPRESSION: Multiple brightly enhancing mass lesions throughout the infratentorial and supratentorial brain as outlined above, most consistent with CNS lymphoma. Lesions are associated with vasogenic edema. The dominant lesion in the right basal ganglia, maximal dimension 2.3 cm by CT, results in mass effect with right-to-left shift of 3 mm. Electronically Signed   By: MNelson ChimesM.D.   On: 05/14/2022 15:53   EEG adult  Result Date: 05/13/2022 YLora Havens MD     05/13/2022 10:25 PM Patient Name: BKennethia LynesMRN: [id number] Attending: PLora HavensReferring Physician/Provider: TClance Boll MD Date: 05/13/2022 Duration: 22.11 mins Patient history: 89yoF with multifocal brain tumors with associated vasogenic edema and left sided weakness. EEG to evaluate for seizure Level of alertness: Awake AEDs during EEG study: LEV Technical aspects: This EEG study was done with  scalp electrodes positioned according to the 10-20 International system of electrode placement. Electrical activity was acquired at a sampling rate of '500Hz'$  and reviewed with a high frequency filter of '70Hz'$  and a low frequency filter  of '1Hz'$ . EEG data were recorded continuously and digitally stored. Description: The posterior dominant rhythm consists of 8-9 Hz activity of moderate voltage (25-35 uV) seen predominantly in posterior head regions, symmetric and reactive to eye opening and eye closing. EEG showed continuous generalized and lateralized right hemisphere polymorphic sharply contoured 3 to 6 Hz theta-delta slowing admixed with 15 to 18 Hz, 2-3 uV beta activity in left hemisphere. Hyperventilation and photic stimulation were not performed.   ABNORMALITY - Continuous slow, generalized and lateralized right hemisphere IMPRESSION: This study is suggestive of cortical dysfunction arising from right hemisphere likely secondary to underlying structural abnormality. Additionally there is mild to moderate diffuse encephalopathy, nonspecific etiology. No seizures or epileptiform discharges were seen throughout the recording. Lora Havens   MR Brain W and Wo Contrast  Result Date: 05/13/2022 CLINICAL DATA:  66 year old female with altered mental status and evidence of 2.5 cm right hemisphere mass with vasogenic edema on noncontrast head CT. EXAM: MRI HEAD WITHOUT AND WITH CONTRAST TECHNIQUE: Multiplanar, multiecho pulse sequences of the brain and surrounding structures were obtained without and with intravenous contrast. CONTRAST:  76m GADAVIST GADOBUTROL 1 MMOL/ML IV SOLN COMPARISON:  Noncontrast head CT 0408 hours today. FINDINGS: Study is intermittently degraded by motion artifact despite repeated imaging attempts. Brain: In the right hemisphere centered at the basal ganglia an oval T2 dark mass with conspicuous decreased diffusion (series 6, image 21) is homogeneously enhancing and approximately 2.6 cm long axis. Confluent surrounding T2 and FLAIR hyperintensity in a vasogenic edema pattern. But there is also evidence of nearby abnormal petechial enhancement in the right corona radiata border in the corpus callosum (series 17, image 15).  Diffusion might be abnormal in the body of the corpus callosum nearby (series 5, image 79). And in the left temporal lobe there is 17 mm area of rounded masslike enhancement which also has abnormal diffusion and abuts the ventricle there (series 16, image 17). Furthermore, there is evidence of abnormal enhancement in the dorsal brainstem on series 16, image 12 with confluent asymmetric brainstem edema (series 10, images 12 and 14). And lastly, there is an 11 mm enhancing oval mass in the central right cerebellum with restricted diffusion and regional cerebellar edema. No areas of dural thickening identified. Intracranial mass effect with leftward midline shift of 7 mm at the septum pellucidum. Effaced right lateral ventricle. No ventriculomegaly, intraventricular debris, definite transependymal edema, or ependymal enhancement. No acute intracranial hemorrhage identified. No restricted diffusion suggestive of acute infarction. Pituitary poorly visualized. Cervicomedullary junction appears to remain normal. Vascular: Major intracranial vascular flow voids are grossly preserved. Skull and upper cervical spine: Not well evaluated due to motion. Sinuses/Orbits: Negative. Other: Visible scalp and face appear negative. IMPRESSION: 1. Multifocal, but also somewhat infiltrative and hypercellular enhancing brain masses involving both cerebral hemispheres, the brainstem, and the right cerebellum. Favor CNS Lymphoma. Multicentric high-grade Glioma and metastatic disease are possible but felt less likely. 2. Extensive associated brain and brainstem edema. Intracranial mass effect with leftward midline shift of 7 mm. 3. Recommend Neuro-Oncology and Neurosurgery consultation. Electronically Signed   By: HGenevie AnnM.D.   On: 05/13/2022 09:20   CT HEAD WO CONTRAST (5MM)  Addendum Date: 05/13/2022   ADDENDUM REPORT: 05/13/2022 05:39 ADDENDUM: Study discussed by telephone with Dr.  CHRISTOPHER POLLINA on 05/13/2022 at 0527 hours.  Electronically Signed   By: Genevie Ann M.D.   On: 05/13/2022 05:39   Result Date: 05/13/2022 CLINICAL DATA:  66 year old female turned mental status. EXAM: CT HEAD WITHOUT CONTRAST TECHNIQUE: Contiguous axial images were obtained from the base of the skull through the vertex without intravenous contrast. RADIATION DOSE REDUCTION: This exam was performed according to the departmental dose-optimization program which includes automated exposure control, adjustment of the mA and/or kV according to patient size and/or use of iterative reconstruction technique. COMPARISON:  Brain MRI 02/08/2010. FINDINGS: Brain: Round and fairly circumscribed slightly hyperdense lesion centered at the right deep gray nuclei is nearly 2.5 cm diameter on series 2, image 17. That area was unremarkable on the 2011 CT. And there is a large volume of surrounding white matter hypodensity in the right hemisphere which most resembles vasogenic edema. Regional mass effect including effaced right lateral ventricle. Leftward midline shift of 7 mm. Edema appears to track into the right midbrain, brainstem on series 5, image 42. Effaced suprasellar cistern. Other basilar cisterns are patent. Questionably also asymmetric edema in the deep right cerebellum series 2 image 10. But no 2nd mass lesion is evident. No convincing acute intracranial hemorrhage or cortically based infarct. The left occipital horn is mildly dilated, and brainstem edema does appear to affect the level of the cerebral aqueduct. Mild if any transependymal edema at this time. Vascular: No suspicious intracranial vascular hyperdensity. Skull: No acute or suspicious osseous lesion identified. Sinuses/Orbits: Visualized paranasal sinuses and mastoids are clear. Other: Visualized orbits and scalp soft tissues are within normal limits. IMPRESSION: 1. 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 2.5 cm hypercellular appearing  mass centered at the right deep gray nuclei. Questionable 2nd lesion in the right cerebellum. Recommend MRI Head without and with contrast to further characterize. 2. 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 Electronically Signed: By: Genevie Ann M.D. On: 05/13/2022 05:10   DG Knee 3 Views Left  Result Date: 05/08/2022 CLINICAL DATA:  Primary osteoarthritis of both knees EXAM: LEFT KNEE - 3 VIEW; RIGHT KNEE - 3 VIEW COMPARISON:  None Available. FINDINGS: Right knee: No acute fracture. Mild tricompartmental degenerative changes appear most advanced in the medial joint compartment. Patellar enthesophyte of the quadriceps insertion. No knee joint effusion. Soft tissues are unremarkable. Left knee: No acute fracture. Moderate tricompartmental degenerative changes more advanced in the medial and patellofemoral joint compartments. Bulky patellar enthesophytes. No knee joint effusion. Soft tissues are unremarkable. IMPRESSION: Bilateral knees: Left-greater-than-right tricompartmental degenerative changes of the knees as described. Electronically Signed   By: Albin Felling M.D.   On: 05/08/2022 15:11   DG Knee 3 Views Right  Result Date: 05/08/2022 CLINICAL DATA:  Primary osteoarthritis of both knees EXAM: LEFT KNEE - 3 VIEW; RIGHT KNEE - 3 VIEW COMPARISON:  None Available. FINDINGS: Right knee: No acute fracture. Mild tricompartmental degenerative changes appear most advanced in the medial joint compartment. Patellar enthesophyte of the quadriceps insertion. No knee joint effusion. Soft tissues are unremarkable. Left knee: No acute fracture. Moderate tricompartmental degenerative changes more advanced in the medial and patellofemoral joint compartments. Bulky patellar enthesophytes. No knee joint effusion. Soft tissues are unremarkable. IMPRESSION: Bilateral knees: Left-greater-than-right  tricompartmental degenerative changes of the knees as described. Electronically Signed   By: Scherrie Bateman.D.  On: 05/08/2022 15:11   DG Chest 2 View  Result Date: 05/08/2022 CLINICAL DATA:  66 year old female with chest pain EXAM: CHEST - 2 VIEW COMPARISON:  None Available. FINDINGS: Cardiomediastinal silhouette within normal limits in size and contour. No evidence of central vascular congestion. No interlobular septal thickening. No pneumothorax or pleural effusion. Coarsened interstitial markings, with no confluent airspace disease. No acute displaced fracture. Degenerative changes of the spine. IMPRESSION: No active cardiopulmonary disease. Electronically Signed   By: Corrie Mckusick D.O.   On: 05/08/2022 08:25    Microbiology: Results for orders placed or performed in visit on 02/11/20  SARS Coronavirus 2 (TAT 6-24 hrs)     Status: None   Collection Time: 02/11/20 12:00 AM  Result Value Ref Range Status   SARS Coronavirus 2 RESULT: NEGATIVE  Final    Comment: RESULT: NEGATIVESARS-CoV-2 INTERPRETATION:A NEGATIVE  test result means that SARS-CoV-2 RNA was not present in the specimen above the limit of detection of this test. This does not preclude a possible SARS-CoV-2 infection and should not be used as the  sole basis for patient management decisions. Negative results must be combined with clinical observations, patient history, and epidemiological information. Optimum specimen types and timing for peak viral levels during infections caused by SARS-CoV-2  have not been determined. Collection of multiple specimens or types of specimens may be necessary to detect virus. Improper specimen collection and handling, sequence variability under primers/probes, or organism present below the limit of detection may  lead to false negative results. Positive and negative predictive values of testing are highly dependent on prevalence. False negative test results are more likely when prevalence of disease is  high.The expected result is NEGATIVE.Fact S heet for  Healthcare Providers: [email or web address].no Sheet for Patients: [email or web address].es Reference Range - Negative     Labs: CBC: Recent Labs  Lab 05/28/22 0210 05/29/22 0008 05/30/22 0157 05/31/22 0420 06/01/22 0551  WBC 15.3* 15.1* 15.5* 14.4* 10.5  NEUTROABS 13.6* 13.2* 13.5* 12.9* 9.0*  HGB 14.7 14.0 13.8 12.7 12.8  HCT 43.7 42.5 41.7 38.2 38.3  MCV 78.9* 79.6* 80.0 79.4* 79.3*  PLT 265 269 235 241 779   Basic Metabolic Panel: Recent Labs  Lab 05/26/22 0231 05/27/22 0248 05/28/22 0210 05/29/22 0008 05/30/22 0157 05/31/22 0420 06/01/22 0551  NA 136 134* 133* 131* 132* 133* 134*  K 4.0 4.2 4.2 4.5 4.3 4.0 4.2  CL 99 97* 96* 94* 96* 98 98  CO2 '27 25 26 28 24 25 27  '$ GLUCOSE 138* 118* 127* 135* 133* 128* 112*  BUN '16 18 15 19 17 14 16  '$ CREATININE 0.76 0.71 0.66 0.70 0.57 0.69 0.62  CALCIUM 9.3 9.3 9.6 9.4 9.2 9.0 9.0  MG 2.1 2.2 2.3 2.4 2.1  --   --   PHOS 3.5 3.1 3.4 3.6 3.4  --   --    Liver Function Tests: Recent Labs  Lab 05/26/22 0231 05/27/22 0248 05/28/22 0210 05/29/22 0008 05/30/22 0157  AST '28 25 26 26 18  '$ ALT 57* 60* 64* 55* 51*  ALKPHOS 50 51 55 60 56  BILITOT 0.4 0.5 0.7 0.8 0.5  PROT 6.4* 6.4* 6.6 6.3* 6.0*  ALBUMIN 3.4* 3.5 3.5 3.5 3.3*   CBG: Recent Labs  Lab 05/28/22 0712 05/28/22 1211 05/29/22 0716 05/29/22 1145 05/30/22 0813  GLUCAP 130* 128* 110* 141* 125*    Discharge time spent: greater than 30 minutes.  Signed: Alma Friendly, MD [facility] 06/01/2022

## 2022-06-14 ENCOUNTER — Other Ambulatory Visit: Payer: Self-pay

## 2022-06-14 ENCOUNTER — Inpatient Hospital Stay (HOSPITAL_COMMUNITY)
Admission: EM | Admit: 2022-06-14 | Discharge: 2022-06-18 | DRG: 054 | Disposition: A | Discharge status: Hospice | Payer: Medicare Other | Source: Skilled Nursing Facility | Attending: Internal Medicine | Admitting: Internal Medicine

## 2022-06-14 ENCOUNTER — Encounter (HOSPITAL_COMMUNITY): Payer: Self-pay

## 2022-06-14 ENCOUNTER — Emergency Department (HOSPITAL_COMMUNITY): Payer: Medicare Other

## 2022-06-14 DIAGNOSIS — Z91018 Allergy to other foods: Secondary | ICD-10-CM | POA: Diagnosis not present

## 2022-06-14 DIAGNOSIS — Z79899 Other long term (current) drug therapy: Secondary | ICD-10-CM

## 2022-06-14 DIAGNOSIS — E872 Acidosis, unspecified: Secondary | ICD-10-CM | POA: Diagnosis present

## 2022-06-14 DIAGNOSIS — E871 Hypo-osmolality and hyponatremia: Secondary | ICD-10-CM | POA: Diagnosis present

## 2022-06-14 DIAGNOSIS — G936 Cerebral edema: Secondary | ICD-10-CM | POA: Diagnosis present

## 2022-06-14 DIAGNOSIS — C7931 Secondary malignant neoplasm of brain: Secondary | ICD-10-CM | POA: Diagnosis present

## 2022-06-14 DIAGNOSIS — G9341 Metabolic encephalopathy: Secondary | ICD-10-CM | POA: Diagnosis present

## 2022-06-14 DIAGNOSIS — M1990 Unspecified osteoarthritis, unspecified site: Secondary | ICD-10-CM | POA: Diagnosis present

## 2022-06-14 DIAGNOSIS — R7303 Prediabetes: Secondary | ICD-10-CM | POA: Diagnosis present

## 2022-06-14 DIAGNOSIS — R739 Hyperglycemia, unspecified: Secondary | ICD-10-CM | POA: Diagnosis not present

## 2022-06-14 DIAGNOSIS — Z888 Allergy status to other drugs, medicaments and biological substances status: Secondary | ICD-10-CM | POA: Diagnosis not present

## 2022-06-14 DIAGNOSIS — D72829 Elevated white blood cell count, unspecified: Secondary | ICD-10-CM | POA: Diagnosis present

## 2022-06-14 DIAGNOSIS — R569 Unspecified convulsions: Secondary | ICD-10-CM

## 2022-06-14 DIAGNOSIS — I10 Essential (primary) hypertension: Secondary | ICD-10-CM | POA: Diagnosis present

## 2022-06-14 DIAGNOSIS — Z90710 Acquired absence of both cervix and uterus: Secondary | ICD-10-CM | POA: Diagnosis not present

## 2022-06-14 DIAGNOSIS — Z66 Do not resuscitate: Secondary | ICD-10-CM | POA: Diagnosis present

## 2022-06-14 DIAGNOSIS — D496 Neoplasm of unspecified behavior of brain: Secondary | ICD-10-CM | POA: Diagnosis not present

## 2022-06-14 DIAGNOSIS — Z515 Encounter for palliative care: Secondary | ICD-10-CM | POA: Diagnosis not present

## 2022-06-14 DIAGNOSIS — C8339 Diffuse large B-cell lymphoma, extranodal and solid organ sites: Secondary | ICD-10-CM | POA: Diagnosis present

## 2022-06-14 DIAGNOSIS — E785 Hyperlipidemia, unspecified: Secondary | ICD-10-CM | POA: Diagnosis present

## 2022-06-14 DIAGNOSIS — Z8249 Family history of ischemic heart disease and other diseases of the circulatory system: Secondary | ICD-10-CM

## 2022-06-14 DIAGNOSIS — Z87891 Personal history of nicotine dependence: Secondary | ICD-10-CM

## 2022-06-14 DIAGNOSIS — Z91014 Allergy to mammalian meats: Secondary | ICD-10-CM

## 2022-06-14 DIAGNOSIS — C8510 Unspecified B-cell lymphoma, unspecified site: Secondary | ICD-10-CM

## 2022-06-14 LAB — URINALYSIS, ROUTINE W REFLEX MICROSCOPIC
Bilirubin Urine: NEGATIVE
Glucose, UA: 250 mg/dL — AB
Hgb urine dipstick: NEGATIVE
Ketones, ur: NEGATIVE mg/dL
Leukocytes,Ua: NEGATIVE
Nitrite: NEGATIVE
Protein, ur: NEGATIVE mg/dL
Specific Gravity, Urine: <1.005 — ABNORMAL LOW (ref 1.005–1.030)
pH: 6 (ref 5.0–8.0)

## 2022-06-14 LAB — CBC WITH DIFFERENTIAL/PLATELET
Abs Immature Granulocytes: 0.25 10*3/uL — ABNORMAL HIGH (ref 0.00–0.07)
Basophils Absolute: 0 10*3/uL (ref 0.0–0.1)
Basophils Relative: 0 %
Eosinophils Absolute: 0 10*3/uL (ref 0.0–0.5)
Eosinophils Relative: 0 %
HCT: 41.6 % (ref 36.0–46.0)
Hemoglobin: 14 g/dL (ref 12.0–15.0)
Immature Granulocytes: 2 %
Lymphocytes Relative: 8 %
Lymphs Abs: 1.1 10*3/uL (ref 0.7–4.0)
MCH: 26.8 pg (ref 26.0–34.0)
MCHC: 33.7 g/dL (ref 30.0–36.0)
MCV: 79.5 fL — ABNORMAL LOW (ref 80.0–100.0)
Monocytes Absolute: 0.6 10*3/uL (ref 0.1–1.0)
Monocytes Relative: 4 %
Neutro Abs: 11.6 10*3/uL — ABNORMAL HIGH (ref 1.7–7.7)
Neutrophils Relative %: 86 %
Platelets: 186 10*3/uL (ref 150–400)
RBC: 5.23 MIL/uL — ABNORMAL HIGH (ref 3.87–5.11)
RDW: 13.5 % (ref 11.5–15.5)
WBC: 13.6 10*3/uL — ABNORMAL HIGH (ref 4.0–10.5)
nRBC: 0 % (ref 0.0–0.2)

## 2022-06-14 LAB — BLOOD GAS, VENOUS
Acid-base deficit: 5.5 mmol/L — ABNORMAL HIGH (ref 0.0–2.0)
Bicarbonate: 16.4 mmol/L — ABNORMAL LOW (ref 20.0–28.0)
O2 Saturation: 72.3 %
Patient temperature: 37
pCO2, Ven: 23 mmHg — ABNORMAL LOW (ref 44–60)
pH, Ven: 7.46 — ABNORMAL HIGH (ref 7.25–7.43)
pO2, Ven: 41 mmHg (ref 32–45)

## 2022-06-14 LAB — LACTIC ACID, PLASMA
Lactic Acid, Venous: >9 mmol/L (ref 0.5–1.9)
Lactic Acid, Venous: 8.9 mmol/L (ref 0.5–1.9)

## 2022-06-14 LAB — CBG MONITORING, ED: Glucose-Capillary: 278 mg/dL — ABNORMAL HIGH (ref 70–99)

## 2022-06-14 LAB — COMPREHENSIVE METABOLIC PANEL
ALT: 37 U/L (ref 0–44)
AST: 39 U/L (ref 15–41)
Albumin: 3.4 g/dL — ABNORMAL LOW (ref 3.5–5.0)
Alkaline Phosphatase: 62 U/L (ref 38–126)
Anion gap: 27 — ABNORMAL HIGH (ref 5–15)
BUN: 15 mg/dL (ref 8–23)
CO2: 11 mmol/L — ABNORMAL LOW (ref 22–32)
Calcium: 9.9 mg/dL (ref 8.9–10.3)
Chloride: 84 mmol/L — ABNORMAL LOW (ref 98–111)
Creatinine, Ser: 0.87 mg/dL (ref 0.44–1.00)
GFR, Estimated: >60 mL/min (ref >60)
Glucose, Bld: 278 mg/dL — ABNORMAL HIGH (ref 70–99)
Potassium: 3.7 mmol/L (ref 3.5–5.1)
Sodium: 122 mmol/L — ABNORMAL LOW (ref 135–145)
Total Bilirubin: 1.5 mg/dL — ABNORMAL HIGH (ref 0.3–1.2)
Total Protein: 7 g/dL (ref 6.5–8.1)

## 2022-06-14 LAB — OSMOLALITY, URINE: Osmolality, Ur: 165 mOsm/kg — ABNORMAL LOW (ref 300–900)

## 2022-06-14 LAB — NA AND K (SODIUM & POTASSIUM), RAND UR
Potassium Urine: 16 mmol/L
Sodium, Ur: <10 mmol/L

## 2022-06-14 MED ORDER — ACETAMINOPHEN 325 MG PO TABS: 650.0000 mg | ORAL_TABLET | Freq: Four times a day (QID) | ORAL | Status: DC | PRN

## 2022-06-14 MED ORDER — GLYCOPYRROLATE 1 MG PO TABS: 1.0000 mg | ORAL_TABLET | ORAL | Status: DC | PRN

## 2022-06-14 MED ORDER — GLYCOPYRROLATE 0.2 MG/ML IJ SOLN: 0.2000 mg | INTRAMUSCULAR | Status: DC | PRN

## 2022-06-14 MED ORDER — LORAZEPAM 2 MG/ML IJ SOLN: 2.0000 mg | INTRAMUSCULAR | Status: DC | PRN

## 2022-06-14 MED ORDER — BIOTENE DRY MOUTH MT LIQD: 15.0000 mL | OROMUCOSAL | Status: DC | PRN

## 2022-06-14 MED ORDER — SODIUM CHLORIDE 0.9 % IV SOLN
INTRAVENOUS | Status: DC
Start: 2022-06-14 — End: 2022-06-15

## 2022-06-14 MED ORDER — LACTATED RINGERS IV SOLN: INTRAVENOUS | Status: DC

## 2022-06-14 MED ORDER — LORAZEPAM 2 MG/ML IJ SOLN
INTRAMUSCULAR | Status: AC
  Administered 2022-06-14: 1 mg via INTRAVENOUS
  Filled 2022-06-14: qty 1

## 2022-06-14 MED ORDER — ONDANSETRON HCL 4 MG/2ML IJ SOLN: 4.0000 mg | Freq: Four times a day (QID) | INTRAMUSCULAR | Status: DC | PRN

## 2022-06-14 MED ORDER — LORAZEPAM 2 MG/ML IJ SOLN: 1.0000 mg | Freq: Once | INTRAMUSCULAR | Status: AC

## 2022-06-14 MED ORDER — ONDANSETRON 4 MG PO TBDP: 4.0000 mg | ORAL_TABLET | Freq: Four times a day (QID) | ORAL | Status: DC | PRN

## 2022-06-14 MED ORDER — ACETAMINOPHEN 650 MG RE SUPP: 650.0000 mg | Freq: Four times a day (QID) | RECTAL | Status: DC | PRN

## 2022-06-14 MED ORDER — LACTATED RINGERS IV BOLUS
500.0000 mL | Freq: Once | INTRAVENOUS | Status: AC
  Administered 2022-06-14: 500 mL via INTRAVENOUS

## 2022-06-14 MED ORDER — POLYVINYL ALCOHOL 1.4 % OP SOLN
1.0000 [drp] | Freq: Four times a day (QID) | OPHTHALMIC | Status: DC | PRN
Start: 2022-06-14 — End: 2022-06-18

## 2022-06-14 MED ORDER — LORAZEPAM 2 MG/ML IJ SOLN
1.0000 mg | Freq: Once | INTRAMUSCULAR | Status: AC
  Administered 2022-06-14: 1 mg via INTRAVENOUS
  Filled 2022-06-14: qty 1

## 2022-06-14 MED ORDER — GLYCOPYRROLATE 0.2 MG/ML IJ SOLN
0.2000 mg | INTRAMUSCULAR | Status: DC | PRN
  Administered 2022-06-14: 0.2 mg via INTRAVENOUS
  Filled 2022-06-14: qty 1

## 2022-06-14 MED ORDER — DEXAMETHASONE SODIUM PHOSPHATE 10 MG/ML IJ SOLN
10.0000 mg | Freq: Once | INTRAMUSCULAR | Status: AC
  Administered 2022-06-14: 10 mg via INTRAVENOUS
  Filled 2022-06-14: qty 1

## 2022-06-14 MED ORDER — LEVETIRACETAM IN NACL 1000 MG/100ML IV SOLN
1000.0000 mg | Freq: Once | INTRAVENOUS | Status: AC
Start: 2022-06-14 — End: 2022-06-14
  Administered 2022-06-14: 1000 mg via INTRAVENOUS
  Filled 2022-06-14: qty 100

## 2022-06-14 NOTE — ED Triage Notes (Signed)
Patient BIBA from [facility]. Staff states patient had a witnessed seizure today lasting about 1 minute. Staff states patient does not have a history of seizures but was recently diagnosed with a brain tumor. Staff at [facility] gave '1mg'$  of Ativan IM.   Patient has IV placed by facility.

## 2022-06-14 NOTE — ED Provider Notes (Signed)
[facility] DEPT Provider Note   CSN: [id number] Arrival date & time: 06/14/22  1124     History  No chief complaint on file.   April Saunders is a 66 y.o. female.  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  Patient cannot provide history.  History is from EMS and family members.  Patient had decreasing level of consciousness over the past several days per the patient's daughter.  She reports they were concerned for her condition.  Today, patient is brought in for seizure-like activity and no verbal responsiveness.  Family member reports that she has had progressively decreasing oral intake and they are concerned for dehydration.  Extensive review of EMR indicates patient has severe advanced metastatic cancer with prior discussion and recommendations for palliative care.    Home Medications Prior to Admission medications   Medication Sig Start Date End Date Taking? Authorizing Provider  acetaminophen (TYLENOL) 500 MG tablet Take 500 mg by mouth every 6 (six) hours as needed for mild pain or headache.    [provider]  amLODipine (NORVASC) 5 MG tablet Take 0.5 tablets (2.5 mg total) by mouth daily. 06/01/22 07/31/22  Alma Friendly, MD  carbamazepine (TEGRETOL) 200 MG tablet Take 1 tablet (200 mg total) by mouth 2 (two) times daily. 07/05/21 05/13/22  Horald Pollen, MD  [facility] Liver Oil CAPS Take 1 capsule by mouth daily.    [provider]  dexamethasone (DECADRON) 6 MG tablet Take 1 tablet (6 mg total) by mouth 4 (four) times daily. 06/01/22   Alma Friendly, MD  ferrous sulfate 325 (65 FE) MG tablet Take 325 mg by mouth daily with breakfast.    [provider]  fish oil-omega-3 fatty acids 1000 MG capsule Take 1 g by mouth daily.    [provider]  levETIRAcetam (KEPPRA) 500 MG tablet Take 1 tablet (500 mg total) by mouth 2 (two) times daily. 06/01/22   Alma Friendly, MD  pantoprazole (PROTONIX) 40 MG tablet Take 1 tablet (40 mg total) by mouth daily. 06/02/22   Alma Friendly, MD  simvastatin (ZOCOR) 20 MG tablet Take 1 tablet (20 mg total) by mouth daily. Patient taking differently: Take 20 mg by mouth at bedtime. 07/05/21   Horald Pollen, MD  Vitamin D, Ergocalciferol, (DRISDOL) 1.25 MG (50000 UNIT) CAPS capsule Take 50,000 Units by mouth every Sunday. 03/16/22   [provider]      Allergies    Atorvastatin calcium, Chicken allergy, Lipitor [atorvastatin], Lisinopril, and Pork-derived products    Review of Systems   Review of Systems Level 5 caveat cannot obtain review of systems due to patient condition. Physical Exam Updated Vital Signs BP (!) 147/94   Pulse (!) 130   Temp 99.8 F (37.7 C)   Resp (!) 29   Ht '5\' 3"'$  (1.6 m)   Wt 58 kg   SpO2 100%   BMI 22.65 kg/m  Physical Exam Constitutional:      Comments: Patient is not verbally responsive.  She is spontaneously breathing and no pooling in the airway.  She has decerebrate appearing posture with both arms flexed with wrist flexed and feet extended.  HENT:     Head: Normocephalic and atraumatic.     Mouth/Throat:     Comments: Airway is clear without pooling secretions.  Mucous  membranes dry. Cardiovascular:     Rate and Rhythm: Normal rate and regular rhythm.  Pulmonary:     Comments: Respirations are not significantly labored.  Grossly clear with anterior auscultation.  No gross rhonchi. Abdominal:     General: There is no distension.     Palpations: Abdomen is soft.  Musculoskeletal:     Comments: No significant peripheral edema.  No deformities.  Skin:    General: Skin is warm and dry.     Coloration: Skin is pale.  Neurological:     Comments: Patient is obtunded.  Decerebrate posturing.  Tonic-clonic activity slow  amplitude.  Does not follow any commands.  No significant response to painful stimuli.     ED Results / Procedures / Treatments   Labs (all labs ordered are listed, but only abnormal results are displayed) Labs Reviewed  CBG MONITORING, ED - Abnormal; Notable for the following components:      Result Value   Glucose-Capillary 278 (*)    All other components within normal limits    EKG None  Radiology No results found.  Procedures Procedures   CRITICAL CARE Performed by: Charlesetta Shanks   Total critical care time: 60 minutes  Critical care time was exclusive of separately billable procedures and treating other patients.  Critical care was necessary to treat or prevent imminent or life-threatening deterioration.  Critical care was time spent personally by me on the following activities: development of treatment plan with patient and/or surrogate as well as nursing, discussions with consultants, evaluation of patient's response to treatment, examination of patient, obtaining history from patient or surrogate, ordering and performing treatments and interventions, ordering and review of laboratory studies, ordering and review of radiographic studies, pulse oximetry and re-evaluation of patient's condition.  Medications Ordered in ED Medications  LORazepam (ATIVAN) injection 1 mg (1 mg Intravenous Given 06/14/22 1218)  levETIRAcetam (KEPPRA) IVPB 1000 mg/100 mL premix (0 mg Intravenous Stopped 06/14/22 1302)  dexamethasone (DECADRON) injection 10 mg (10 mg Intravenous Given 06/14/22 1232)  lactated ringers bolus 500 mL (0 mLs Intravenous Stopped 06/14/22 1302)  LORazepam (ATIVAN) injection 1 mg (1 mg Intravenous Given 06/14/22 1352)    ED Course/ Medical Decision Making/ A&P                           Medical Decision Making Amount and/or Complexity of Data Reviewed Labs: ordered. Radiology: ordered.  Risk Prescription drug management. Decision regarding  hospitalization.   Patient arrives in extremis with decerebrate posturing and seizure-like activity.  Review of EMR indicates patient has advanced metastatic brain cancer with pre-existing vasogenic edema.  For active seizure activity, patient was given Ativan 1 mg IV and Keppra load 1000 mg.  Patient's airway was intact.  She did not have pooling secretions or obstructing airway.  My initial conversation with family indicated that they wish to pursue all supportive measures and they were not "ready to give up".  However after speaking with the patient's daughter she did advise that they were aware of the severity of the situation and the conversations that had been had regarding severely limited potential for improvement.  Multiple consultations made.  Patient did have some appearance of improvement in seizure-like activity after Ativan and Keppra.  She did continue to maintain decerebrate positioning of her extremities but did not exhibit any tonic-clonic activity.    CT head reviewed by radiology and also visually reviewed by myself shows mass creating midline shift and  increased volume of edema.  Labs reviewed with increasing hyponatremia.  Blood glucose 278, BUN and creatinine stable.  I did personally discuss the patient's case with the patient's husband and daughter.  Consult: 13: 15 reviewed with Dr. Glenford Peers, neurosurgery.  He advises that if family wants to pursue ongoing management of brain tumor with goal of possible radiation for palliation, patient should be transferred to [facility] for further treatment.  Suggest admission to intensivist service and consultation to neurosurgery. 13: 50 Dr. Glenford Peers called back to advise he has reviewed the chart and patient is actually cared for by Dr. Mickeal Skinner, neurooncology.  From perspective of neurosurgery there would not be further treatment available or indicated for this patient.  He recommends contacting Dr. Mickeal Skinner. 13: 56 discussed with Dr.  Mickeal Skinner.  He advises there are no further treatments that could reverse the course for the patient's condition.  Comfort care is the only measure available for the patient. 14: 33 PCM PA-C has discussed case with family members and at this time they have decided to proceed with palliative care and DNR status. 14: 50 reviewed with Dr. Marylyn Ishihara [facility] for [facility].  Ultimately after consultation with critical care, decision was made for comfort care only.        Final Clinical Impression(s) / ED Diagnoses Final diagnoses:  Metastatic cancer to brain [facility])  Seizure-like activity [facility])    Rx / [facility] Orders ED Discharge Orders     None         Charlesetta Shanks, MD 06/22/22 [phone]

## 2022-06-14 NOTE — H&P (Signed)
History and Physical    Patient: April Saunders CWC:[id number] DOB: 10/26/1956 DOA: 06/14/2022 DOS: the patient was seen and examined on 06/14/2022 PCP: Willene Hatchet, NP  Patient coming from: SNF  Chief Complaint: AMS  HPI: April Saunders is a 66 y.o. female with medical history significant of HTN, HLD, large B-cell lymphoma of brain. Presenting with altered mental status. History is from daughter at bedside. She reports that the patient had a recent diagnosis of a primary brain B-cell lymphoma and was in rehab. She was on palliative treatment at the time. It is noted, that her health has been declining over the last month. She was notified this morning by the rehab center that her mother had what appeared to be a seizure and was unresponsive. She was sent to the ED for evaluation.    Review of Systems: unable to review all systems due to the inability of the patient to answer questions. Past Medical History:  Diagnosis Date   Abnormal MRI    brain   DJD (degenerative joint disease)    multiple joints   Hyperlipidemia    Hypertension    Lipoma    rt ankle   Obesity    Trigeminal neuralgia 07/05/2021   Past Surgical History:  Procedure Laterality Date   ABDOMINAL HYSTERECTOMY     ? ovaries   APPLICATION OF CRANIAL NAVIGATION Right 05/16/2022   Procedure: APPLICATION OF CRANIAL NAVIGATION;  Surgeon: Vallarie Mare, MD;  Location: [facility];  Service: Neurosurgery;  Laterality: Right;   BRAIN BIOPSY Right 05/16/2022   Procedure: STEREOTACTIC BRAIN BIOPSY;  Surgeon: Vallarie Mare, MD;  Location: [facility];  Service: Neurosurgery;  Laterality: Right;   COLONOSCOPY     Social History:  reports that she has quit smoking. Her smoking use included cigarettes. She has never used smokeless tobacco. She reports that she does not drink alcohol and does not use drugs.  Allergies  Allergen Reactions   Atorvastatin Calcium Itching   Chicken Allergy Nausea And Vomiting   Lipitor [Atorvastatin]  Other (See Comments)    Weakness    Lisinopril Other (See Comments)    Shoulder pain   Pork-Derived Products Nausea And Vomiting    Family History  Problem Relation Age of Onset   Diabetes Mellitus I Mother    Hypertension Brother    Diabetes Mellitus I Brother    Esophageal cancer Brother    Colon cancer Neg Hx    Stomach cancer Neg Hx    Rectal cancer Neg Hx     Prior to Admission medications   Medication Sig Start Date End Date Taking? Authorizing Provider  acetaminophen (TYLENOL) 500 MG tablet Take 500 mg by mouth every 6 (six) hours as needed for mild pain or headache.   Yes [provider]  amLODipine (NORVASC) 5 MG tablet Take 0.5 tablets (2.5 mg total) by mouth daily. 06/01/22 07/31/22 Yes Alma Friendly, MD  carbamazepine (TEGRETOL) 200 MG tablet Take 1 tablet (200 mg total) by mouth 2 (two) times daily. 07/05/21 06/14/22 Yes Sagardia, Ines Bloomer, MD  dexamethasone (DECADRON) 6 MG tablet Take 1 tablet (6 mg total) by mouth 4 (four) times daily. 06/01/22  Yes Alma Friendly, MD  ferrous sulfate 325 (65 FE) MG tablet Take 325 mg by mouth daily with breakfast.   Yes [provider]  fish oil-omega-3 fatty acids 1000 MG capsule Take 1 g by mouth daily.   Yes [provider]  levETIRAcetam (KEPPRA) 500 MG tablet Take  1 tablet (500 mg total) by mouth 2 (two) times daily. 06/01/22  Yes Alma Friendly, MD  mirtazapine (REMERON) 7.5 MG tablet Take 7.5 mg by mouth at bedtime.   Yes [provider]  pantoprazole (PROTONIX) 40 MG tablet Take 1 tablet (40 mg total) by mouth daily. 06/02/22  Yes Alma Friendly, MD  simvastatin (ZOCOR) 20 MG tablet Take 1 tablet (20 mg total) by mouth daily. Patient taking differently: Take 20 mg by mouth at bedtime. 07/05/21  Yes Sagardia, Ines Bloomer, MD  Vitamin D, Ergocalciferol, (DRISDOL) 1.25 MG (50000 UNIT) CAPS capsule Take 50,000 Units by mouth every Sunday. 03/16/22  Yes [provider]     Physical Exam: Vitals:   06/14/22 1400 06/14/22 1415 06/14/22 1430 06/14/22 1445  BP: (!) 137/94 127/62 118/68 119/72  Pulse: (!) 105 89 (!) 102 90  Resp: (!) 24 20 (!) 25 (!) 22  Temp:      SpO2: 96% 95% 99% 98%  Weight:      Height:       General: 66 y.o. ill appearing female resting in bed in NAD Eyes: minimally reactive to light normal sclera ENMT: Nares patent w/o discharge, orophaynx clear, dentition normal, ears w/o discharge/lesions/ulcers Neck: Supple, trachea midline Cardiovascular: RRR, +S1, S2, no m/g/r, equal pulses throughout Respiratory: CTABL, no w/r/r, normal WOB GI: BS+, NDNT, no masses noted, no organomegaly noted MSK: No e/c/c Neuro: obtunded w/ decorticate posturing  Data Reviewed:  Lab Results  Component Value Date   NA 122 (L) 06/14/2022   K 3.7 06/14/2022   CO2 11 (L) 06/14/2022   GLUCOSE 278 (H) 06/14/2022   BUN 15 06/14/2022   CREATININE 0.87 06/14/2022   CALCIUM 9.9 06/14/2022   GFRNONAA >60 06/14/2022   Lab Results  Component Value Date   WBC 13.6 (H) 06/14/2022   HGB 14.0 06/14/2022   HCT 41.6 06/14/2022   MCV 79.5 (L) 06/14/2022   PLT 186 06/14/2022   CTH 1. Interval increase in the size of the hyperdense mass in the right basal ganglia, with increased surrounding edema, effacement of the right lateral ventricle and third ventricle, and unchanged right-to-left midline shift. 2. Additional left temporal and right cerebellar lesions appear similar to the prior exam. Other enhancing foci seen on the prior MRI are not well delineated on CT.  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 Inpatient status is judged to be reasonable and necessary in order to provide the required intensity of service to ensure the patient's safety. The patient's presenting symptoms, physical exam findings, and initial  radiographic and laboratory data in the context of their chronic comorbidities is felt to place them at high risk for further clinical deterioration. Furthermore, it is not anticipated that the patient will be medically stable for discharge from the hospital within 2 midnights of admission.   * I certify that at the point of admission it is my clinical judgment that the patient will require inpatient hospital care spanning beyond 2 midnights from the point of admission due to high intensity of service, high risk for further deterioration and high frequency of surveillance required.*  Author: Jonnie Finner, DO 06/14/2022 2:53 PM  For on call review [email or web address].

## 2022-06-14 NOTE — Plan of Care (Signed)
Problem: Education: family Goal: Knowledge of General Education information will improve Description: Including pain rating scale, medication(s)/side effects and non-pharmacologic comfort measures Outcome: Progressing   Problem: Coping: family Goal: Level of anxiety will decrease Outcome: Progressing   Problem: Clinical Measurements: patient Goal: Usual level of consciousness will be regained or maintained. Outcome: Not Progressing   Ivan Anchors, RN 06/14/22 5:27 PM

## 2022-06-14 NOTE — Consult Note (Signed)
NAME:  April Saunders, MRN:  [id number], DOB:  1956-03-17, LOS: 0 ADMISSION DATE:  06/14/2022, CONSULTATION DATE:  06/14/2022 REFERRING MD:  D. Pheiffer - EDP, CHIEF COMPLAINT: New onset seizures  History of Present Illness:  April Saunders is a 66 year old female with a past medical history significant for multifocal brain tumor associated with vasogenic edema in the setting of large B-cell lymphoma, hypertension, hyperlipidemia, DJD, and obesity the emergency department from local SNF due to reports of witnessed seizure-like activity.    Arrival to ED patient was seen with mild hypertension and tachypnea.  On my assessment patient is seen unresponsive with intermittent snoring respirations after receiving 2 mg IV Ativan and 1000 mg load of Keppra.  Emergency department physician communicated with both neurosurgery and neuro-oncology.  Both have assessed patient's chart and unfortunately there are no further interventions available for patient.  Dr. Mickeal Skinner with neuro oncology has recommended comfort care.    PCCM was consulted to help establish goals of care.  On arrival to ED patient is unresponsive and unable to relay wishes.  Therefore conference call was held with both patient daughters.  Case was discussed in detail with both and all questions were answered.  Family has decided to proceed with comfort care.  Emergency department physician will consult hospitalist for admission and palliative care for support.  Pertinent  Medical History  Multifocal brain tumor associated with vasogenic edema in the setting of large B-cell lymphoma, hypertension, hyperlipidemia, DJD, and obesity  Significant Hospital Events: Including procedures, antibiotic start and stop dates in addition to other pertinent events   8/3 Admitted from SNF with new onset seizures  Interim History / Subjective:  Unresponsive  Objective   Blood pressure 118/68, pulse (!) 102, temperature 99.8 F (37.7 C), resp. rate (!) 25,  height '5\' 3"'$  (1.6 m), weight 58 kg, SpO2 99 %.       No intake or output data in the 24 hours ending 06/14/22 1444 Filed Weights   06/14/22 1133  Weight: 58 kg    Examination: General: Acute on chronic deconditioned thin elderly female lying in bed in no acute distress HEENT: [place]/AT, MM pink/moist, PERRL,  Neuro: Unresponsive CV: s1s2 regular rate and rhythm, no murmur, rubs, or gallops,  PULM: Clear to auscultation bilaterally, intermittent snoring respirations, no increased work of breathing, no added breath sounds, on nasal cannula GI: soft, bowel sounds active in all 4 quadrants, non-tender, non-distended, Extremities: warm/dry, no edema  Skin: no rashes or lesions  Resolved Hospital Problem list     Assessment & Plan:  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  Additionally left temporal and right cerebral lesions appear similar to prior exam New onset seizures in the setting of above -Witnessed seizure-like activity at SNF patient was loaded with IV Keppra and received benzodiazepine on arrival to ED Hyponatremia in the setting of above Hyperglycemia Severely elevated lactic  Leukocytosis History of hyperlipidemia/hypertension P: EDP communicated with both neurosurgery and neurooncology.  Both have assessed patient's chart and unfortunately there are no further interventions available for patient.  Dr. Mickeal Skinner with neurooncology has recommended comfort care.    PCCM was consulted to help establish goals of care.  On arrival to ED patient is unresponsive and unable  to relay wishes.  Therefore conference call was held with both patient daughters.  Case was discussed in detail with  both and all questions were answered.  Family has decided to proceed with comfort care.  DO NOT RESUSCITATE order will be placed. emergency department physician will consult hospitalist for admission and palliative care for support.  Utilize as needed benzodiazepines for control of seizures with comfort measures included.  Palliative care has also been consulted and updated regarding plan of care.  They will assess patient tomorrow morning 8/4  Best Practice (right click and "Reselect all SmartList Selections" daily)   Diet/type: NPO DVT prophylaxis: not indicated GI prophylaxis: N/A Lines: N/A Foley:  N/A Code Status:  DNR Last date of multidisciplinary goals of care discussion: See goals of care discussion as above  Labs   CBC: Recent Labs  Lab 06/14/22 1142  WBC 13.6*  NEUTROABS 11.6*  HGB 14.0  HCT 41.6  MCV 79.5*  PLT 259    Basic Metabolic Panel: Recent Labs  Lab 06/14/22 1142  NA 122*  K 3.7  CL 84*  CO2 11*  GLUCOSE 278*  BUN 15  CREATININE 0.87  CALCIUM 9.9   GFR: Estimated Creatinine Clearance: 53.3 mL/min (by C-G formula based on SCr of 0.87 mg/dL). Recent Labs  Lab 06/14/22 1142 06/14/22 1233  WBC 13.6*  --   LATICACIDVEN  --  >9.0*    Liver Function Tests: Recent Labs  Lab 06/14/22 1142  AST 39  ALT 37  ALKPHOS 62  BILITOT 1.5*  PROT 7.0  ALBUMIN 3.4*   No results for input(s): "LIPASE", "AMYLASE" in the last 168 hours. No results for input(s): "AMMONIA" in the last 168 hours.  ABG    Component Value Date/Time   PHART 7.42 05/13/2022 1201   PCO2ART 39 05/13/2022 1201   PO2ART 84 05/13/2022 1201   HCO3 16.4 (L) 06/14/2022 1308   ACIDBASEDEF 5.5 (H) 06/14/2022 1308   O2SAT 72.3 06/14/2022 1308     Coagulation Profile: No results for input(s): "INR", "PROTIME" in the last 168 hours.  Cardiac Enzymes: No results for input(s): "CKTOTAL", "CKMB", "CKMBINDEX", "TROPONINI" in the last 168 hours.  HbA1C: Hgb A1c MFr Bld  Date/Time  Value Ref Range Status  01/05/2021 08:58 AM 6.0 (H) 4.8 - 5.6 % Final    Comment:             Prediabetes: 5.7 - 6.4          Diabetes: >6.4          Glycemic control for adults with diabetes: <7.0   01/06/2020 04:49 PM 6.4 (H) 4.8 - 5.6 % Final    Comment:             Prediabetes: 5.7 - 6.4          Diabetes: >6.4          Glycemic control for adults with diabetes: <7.0     CBG: Recent Labs  Lab 06/14/22 1139  GLUCAP 278*    Review of Systems:   Unable to assess   Past Medical History:  She,  has a past medical history of Abnormal MRI, DJD (degenerative joint disease), Hyperlipidemia, Hypertension, Lipoma, Obesity, and Trigeminal neuralgia (07/05/2021).   Surgical History:   Past Surgical History:  Procedure Laterality Date   ABDOMINAL HYSTERECTOMY     ? ovaries   APPLICATION OF CRANIAL NAVIGATION Right 05/16/2022   Procedure: APPLICATION OF CRANIAL NAVIGATION;  Surgeon: Vallarie Mare, MD;  Location:  [facility];  Service: Neurosurgery;  Laterality: Right;   BRAIN BIOPSY Right 05/16/2022   Procedure: STEREOTACTIC BRAIN BIOPSY;  Surgeon: Vallarie Mare, MD;  Location: [facility];  Service: Neurosurgery;  Laterality: Right;   COLONOSCOPY       Social History:   reports that she has quit smoking. Her smoking use included cigarettes. She has never used smokeless tobacco. She reports that she does not drink alcohol and does not use drugs.   Family History:  Her family history includes Diabetes Mellitus I in her brother and mother; Esophageal cancer in her brother; Hypertension in her brother. There is no history of Colon cancer, Stomach cancer, or Rectal cancer.   Allergies Allergies  Allergen Reactions   Atorvastatin Calcium Itching   Chicken Allergy Nausea And Vomiting   Lipitor [Atorvastatin] Other (See Comments)    Weakness    Lisinopril Other (See Comments)    Shoulder pain   Pork-Derived Products Nausea And Vomiting     Home Medications  Prior to Admission  medications   Medication Sig Start Date End Date Taking? Authorizing Provider  acetaminophen (TYLENOL) 500 MG tablet Take 500 mg by mouth every 6 (six) hours as needed for mild pain or headache.   Yes [provider]  amLODipine (NORVASC) 5 MG tablet Take 0.5 tablets (2.5 mg total) by mouth daily. 06/01/22 07/31/22 Yes Alma Friendly, MD  carbamazepine (TEGRETOL) 200 MG tablet Take 1 tablet (200 mg total) by mouth 2 (two) times daily. 07/05/21 06/14/22 Yes Sagardia, Ines Bloomer, MD  dexamethasone (DECADRON) 6 MG tablet Take 1 tablet (6 mg total) by mouth 4 (four) times daily. 06/01/22  Yes Alma Friendly, MD  ferrous sulfate 325 (65 FE) MG tablet Take 325 mg by mouth daily with breakfast.   Yes [provider]  fish oil-omega-3 fatty acids 1000 MG capsule Take 1 g by mouth daily.   Yes [provider]  levETIRAcetam (KEPPRA) 500 MG tablet Take 1 tablet (500 mg total) by mouth 2 (two) times daily. 06/01/22  Yes Alma Friendly, MD  mirtazapine (REMERON) 7.5 MG tablet Take 7.5 mg by mouth at bedtime.   Yes [provider]  pantoprazole (PROTONIX) 40 MG tablet Take 1 tablet (40 mg total) by mouth daily. 06/02/22  Yes Alma Friendly, MD  simvastatin (ZOCOR) 20 MG tablet Take 1 tablet (20 mg total) by mouth daily. Patient taking differently: Take 20 mg by mouth at bedtime. 07/05/21  Yes Sagardia, Ines Bloomer, MD  Vitamin D, Ergocalciferol, (DRISDOL) 1.25 MG (50000 UNIT) CAPS capsule Take 50,000 Units by mouth every Sunday. 03/16/22  Yes [provider]     Critical care time: NA  Jatniel Verastegui D. Kenton Kingfisher, NP-C [facility] Personal contact information can be found on Amion  06/14/2022, 2:59 PM

## 2022-06-15 DIAGNOSIS — D496 Neoplasm of unspecified behavior of brain: Secondary | ICD-10-CM

## 2022-06-15 DIAGNOSIS — Z515 Encounter for palliative care: Secondary | ICD-10-CM | POA: Diagnosis not present

## 2022-06-15 DIAGNOSIS — R569 Unspecified convulsions: Secondary | ICD-10-CM | POA: Diagnosis not present

## 2022-06-15 DIAGNOSIS — Z66 Do not resuscitate: Secondary | ICD-10-CM

## 2022-06-15 DIAGNOSIS — G9341 Metabolic encephalopathy: Secondary | ICD-10-CM

## 2022-06-15 MED ORDER — MORPHINE SULFATE (PF) 2 MG/ML IV SOLN
1.0000 mg | INTRAVENOUS | Status: DC | PRN
  Administered 2022-06-18: 2 mg via INTRAVENOUS
  Filled 2022-06-15: qty 1

## 2022-06-15 NOTE — Consult Note (Signed)
Palliative Care Consult Note                                  Date: 06/15/2022   Patient Name: April Saunders  DOB: 16-Mar-1956  MRN: [id number]  Age / Sex: 66 y.o., female  PCP: April Hatchet, NP Referring Physician: Damita Lack, MD  Reason for Consultation: End-of-life care, symptom management  HPI/Patient Profile: 66 y.o. female  with past medical history of large B-cell lymphoma of brain, hypertension, and hyperlipidemia who presented to [facility] ED on 06/14/2022 with altered mental status.  Daughter was notified this morning by the rehab facility that her mother had what appeared to be a seizure and was unresponsive.  CT head showed interval increase in the size of the hyperdense mass in the right basal ganglia, with increased surrounding edema and unchanged right to left midline shift.   Patient was admitted to [facility] service with acute metabolic encephalopathy and seizures secondary to brain tumor.  Past Medical History:  Diagnosis Date   Abnormal MRI    brain   DJD (degenerative joint disease)    multiple joints   Hyperlipidemia    Hypertension    Lipoma    rt ankle   Obesity    Trigeminal neuralgia 07/05/2021    Subjective:   I have reviewed medical records including EPIC notes, labs and imaging, and discussed with PCCM April Leitz, NP.  She felt that patient would likely be a hospital death.  Note that ED provider spoke with neurosurgery and neuro-oncology, who deemed that there was no curative option and recommended palliative care.  EDP also spoke with family; they agreed to comfort care and this was confirmed by admitting provider Dr. Jiles Saunders.   End-of-life order set was utilized.  Patient is known to PMT from her previous hospitalization in July.  At this time, goals of care were discussed with her daughter April Saunders.  Daughter understood that the brain tumor was inoperable and that patient cannot undergo chemotherapy due  to her decline in functional status.  However, daughter remain hopeful that her mother could improve her functional status and rehab and then be able to undergo some treatment in efforts to prolong her life.  I went to see patient at bedside. She appears comfortable. She is unresponsive to voice and light touch. No non-verbal signs of pain or discomfort noted. Respirations are even and unlabored. No excessive respiratory secretions noted. Family present at bedside. Education and counseling provided on natural trajectory at EOL. Emotional support provided.    Questions and concerns addressed. Family encouraged to call with questions or concerns.    Upon review of active orders, I noted that there was not a PRN medication for pain, so I have added IV morphine.   Review of Systems  Unable to perform ROS: Patient unresponsive    Objective:   Primary Diagnoses: Present on Admission:  Dyslipidemia  Hypertension  Brain tumor ([place])  Prediabetes   Physical Exam Vitals reviewed.  Constitutional:      General: She is not in acute distress.    Appearance: She is ill-appearing.  Pulmonary:     Effort: Pulmonary effort is normal.  Neurological:     Mental Status: She is unresponsive.     Vital Signs:  BP 101/72 (BP Location: Left Arm)   Pulse (!) 102   Temp 97.8 F (36.6 C) (Axillary)   Resp 16   Ht  $'5\' 3"'W$  (1.6 m)   Wt 58 kg   SpO2 98%   BMI 22.65 kg/m   Palliative Assessment/Data: 10%     Assessment & Plan:   SUMMARY OF RECOMMENDATIONS   Continue comfort care DNR/DNI as previously documented PMT will continue to support  Symptom Management:  Morphine prn for pain or dyspnea Lorazepam (ATIVAN) prn for anxiety or seizure Glycopyrrolate (ROBINUL) for excessive secretions Ondansetron (ZOFRAN) prn for nausea Polyvinyl alcohol (LIQUIFILM TEARS) prn for dry eyes Antiseptic oral rinse (BIOTENE) prn for dry mouth  Prognosis:  < 2 weeks  Discharge Planning:  Anticipated  Hospital Death    Thank you for allowing [place] to participate in the care of April Saunders - High   Signed by: Elie Confer, NP Palliative Medicine Team  Team Phone # [phone]  For individual providers, please see AMION

## 2022-06-15 NOTE — Progress Notes (Signed)
PROGRESS NOTE    April Saunders  IHK:[id number] DOB: 10-02-1956 DOA: 06/14/2022 PCP: Willene Hatchet, NP   Brief Narrative:  66 year old with history of HTN, HLD, large B-cell lymphoma of brain presented with altered mental status.  Patient has significant metastases with edema and right-to-left midline shift.  Patient has been made comfort care.   Assessment & Plan:  Principal Problem:   Seizure ([place]) Active Problems:   Hypertension   Dyslipidemia   Prediabetes   Brain tumor ([place])   Acute metabolic encephalopathy   B-cell lymphoma ([facility])   Hyponatremia   Lactic acidosis   Acute metabolic encephalopathy Primary diffuse large B-cell lymphoma of CNS Cerebral edema with midline shift right to left Essential hypertension Hyperlipidemia  -Patient has been made comfort care at this time.  Appropriate orders in place.    DVT prophylaxis: None Code Status: DNR Family Communication:  Called her daugther, [name].  Updated  Status is: Inpatient Remains inpatient appropriate because: Feels ok no complaints.     Subjective:  Patient remains unresponsive   Examination: Constitutional: Remains unresponsive.  Chronically ill Respiratory: Clear to auscultation bilaterally Cardiovascular: Normal sinus rhythm, no rubs Abdomen: Nontender nondistended good bowel sounds Musculoskeletal: No edema noted Skin: No rashes seen Neurologic: Unable to assess Psychiatric: Unable to assess  Objective: Vitals:   06/14/22 1518 06/14/22 1530 06/14/22 1648 06/14/22 2052  BP:  110/74 123/68 101/72  Pulse:  94 (!) 101 (!) 102  Resp:  (!) '21 16 16  '$ Temp: 99.5 F (37.5 C)  98.1 F (36.7 C) 97.8 F (36.6 C)  TempSrc:   Oral Axillary  SpO2:  98% 97% 98%  Weight:      Height:        Intake/Output Summary (Last 24 hours) at 06/15/2022 1432 Last data filed at 06/15/2022 0650 Gross per 24 hour  Intake 0 ml  Output 400 ml  Net -400 ml   Filed Weights   06/14/22 1133  Weight: 58 kg      Data Reviewed:   CBC: Recent Labs  Lab 06/14/22 1142  WBC 13.6*  NEUTROABS 11.6*  HGB 14.0  HCT 41.6  MCV 79.5*  PLT 756   Basic Metabolic Panel: Recent Labs  Lab 06/14/22 1142  NA 122*  K 3.7  CL 84*  CO2 11*  GLUCOSE 278*  BUN 15  CREATININE 0.87  CALCIUM 9.9   GFR: Estimated Creatinine Clearance: 53.3 mL/min (by C-G formula based on SCr of 0.87 mg/dL). Liver Function Tests: Recent Labs  Lab 06/14/22 1142  AST 39  ALT 37  ALKPHOS 62  BILITOT 1.5*  PROT 7.0  ALBUMIN 3.4*   No results for input(s): "LIPASE", "AMYLASE" in the last 168 hours. No results for input(s): "AMMONIA" in the last 168 hours. Coagulation Profile: No results for input(s): "INR", "PROTIME" in the last 168 hours. Cardiac Enzymes: No results for input(s): "CKTOTAL", "CKMB", "CKMBINDEX", "TROPONINI" in the last 168 hours. BNP (last 3 results) No results for input(s): "PROBNP" in the last 8760 hours. HbA1C: No results for input(s): "HGBA1C" in the last 72 hours. CBG: Recent Labs  Lab 06/14/22 1139  GLUCAP 278*   Lipid Profile: No results for input(s): "CHOL", "HDL", "LDLCALC", "TRIG", "CHOLHDL", "LDLDIRECT" in the last 72 hours. Thyroid Function Tests: No results for input(s): "TSH", "T4TOTAL", "FREET4", "T3FREE", "THYROIDAB" in the last 72 hours. Anemia Panel: No results for input(s): "VITAMINB12", "FOLATE", "FERRITIN", "TIBC", "IRON", "RETICCTPCT" in the last 72 hours. Sepsis Labs: Recent Labs  Lab 06/14/22 1233 06/14/22  1413  LATICACIDVEN >9.0* 8.9*    No results found for this or any previous visit (from the past 240 hour(s)).       Radiology Studies: CT Head Wo Contrast  Result Date: 06/14/2022 CLINICAL DATA:  Altered mental status with no neoplasm EXAM: CT HEAD WITHOUT CONTRAST TECHNIQUE: Contiguous axial images were obtained from the base of the skull through the vertex without intravenous contrast. RADIATION DOSE REDUCTION: This exam was performed according  to the departmental dose-optimization program which includes automated exposure control, adjustment of the mA and/or kV according to patient size and/or use of iterative reconstruction technique. COMPARISON:  05/22/2022, 05/17/2022, 05/14/2022 FINDINGS: Brain: Redemonstrated hyperdense mass in the right basal ganglia, which measures 3.2 x 2.7 x 2.9 cm (AP x TR x CC), previously 2.5 x 2.6 x 2.5 cm on 05/22/2022. New low-density within the mass, which may represent necrosis. Increased surrounding edema and mass effect on the right lateral ventricle and third ventricle without significant increase in the size of the left lateral ventricle approximately 7 mm of right-to-left midline shift, similar to prior. Additional 0.7 cm lesion in the right cerebellum (series 2, image 8) with minimal surrounding edema and no significant mass effect on the fourth ventricle, similar to prior. Left mesial temporal lobe mass measures approximately 2.0 cm (series 2, image 11), with surrounding edema that appears slightly increased compared to the prior exam, although this lesion is less well-defined on both the current and prior exam. Additional lesions seen on MRI are not well defined on noncontrast CT. No definite new lesion is seen. Hyperdensity adjacent to the mass and along the biopsy tract is favored to represent calcifications related to the biopsy. No extra-axial collection. Vascular: No hyperdense vessel. Skull: Right frontal burr hole. Negative for fracture or focal lesion. Sinuses/Orbits: No acute finding. Other: The mastoids are well aerated. IMPRESSION: 1. Interval increase in the size of the hyperdense mass in the right basal ganglia, with increased surrounding edema, effacement of the right lateral ventricle and third ventricle, and unchanged right-to-left midline shift. 2. Additional left temporal and right cerebellar lesions appear similar to the prior exam. Other enhancing foci seen on the prior MRI are not well  delineated on CT. Electronically Signed   By: Merilyn Baba M.D.   On: 06/14/2022 12:37        Scheduled Meds:  Continuous Infusions:    LOS: 1 day   Time spent= 35 mins    Claretta Kendra Arsenio Loader, MD [facility]  If 7PM-7AM, please contact night-coverage  06/15/2022, 2:32 PM

## 2022-06-16 NOTE — Progress Notes (Signed)
PROGRESS NOTE    April Saunders  FYB:[id number] DOB: March 04, 1956 DOA: 06/14/2022 PCP: Willene Hatchet, NP   Brief Narrative:  66 year old with history of HTN, HLD, large B-cell lymphoma of brain presented with altered mental status.  Patient has significant metastases with edema and right-to-left midline shift.  Patient has been made comfort care.   Assessment & Plan:  Principal Problem:   Seizure ([place]) Active Problems:   Hypertension   Dyslipidemia   Prediabetes   Brain tumor ([place])   Acute metabolic encephalopathy   B-cell lymphoma (HCC)   Hyponatremia   Lactic acidosis   Acute metabolic encephalopathy Primary diffuse large B-cell lymphoma of CNS Cerebral edema with midline shift right to left Essential hypertension Hyperlipidemia  -Patient has been made comfort care at this time.  Appropriate orders in place. Anticipate in hospital deaeth.    DVT prophylaxis: None Code Status: DNR Family Communication:  Discussed with family.   Status is: Inpatient Remains inpatient appropriate because:     Subjective:  Unresponsive, no complaints.   Examination: Constitutional: Remains unresponsive.  Chronically ill. Pupils are not reactive to light. No respose to stimuli.  Respiratory: Clear to auscultation bilaterally Cardiovascular: Normal sinus rhythm, no rubs Abdomen: Nontender nondistended good bowel sounds Musculoskeletal: No edema noted Skin: No rashes seen Neurologic: Unable to assess Psychiatric: Unable to assess  Objective: Vitals:   06/14/22 1648 06/14/22 2052 06/15/22 1531 06/16/22 0648  BP: 123/68 101/72 (!) 111/59 128/74  Pulse: (!) 101 (!) 102 (!) 111 (!) 109  Resp: '16 16 20 18  '$ Temp: 98.1 F (36.7 C) 97.8 F (36.6 C) 99.2 F (37.3 C) 98.8 F (37.1 C)  TempSrc: Oral Axillary Oral Oral  SpO2: 97% 98% 93% 99%  Weight:      Height:       No intake or output data in the 24 hours ending 06/16/22 0831  Filed Weights   06/14/22 1133  Weight: 58 kg      Data Reviewed:   CBC: Recent Labs  Lab 06/14/22 1142  WBC 13.6*  NEUTROABS 11.6*  HGB 14.0  HCT 41.6  MCV 79.5*  PLT 527   Basic Metabolic Panel: Recent Labs  Lab 06/14/22 1142  NA 122*  K 3.7  CL 84*  CO2 11*  GLUCOSE 278*  BUN 15  CREATININE 0.87  CALCIUM 9.9   GFR: Estimated Creatinine Clearance: 53.3 mL/min (by C-G formula based on SCr of 0.87 mg/dL). Liver Function Tests: Recent Labs  Lab 06/14/22 1142  AST 39  ALT 37  ALKPHOS 62  BILITOT 1.5*  PROT 7.0  ALBUMIN 3.4*   No results for input(s): "LIPASE", "AMYLASE" in the last 168 hours. No results for input(s): "AMMONIA" in the last 168 hours. Coagulation Profile: No results for input(s): "INR", "PROTIME" in the last 168 hours. Cardiac Enzymes: No results for input(s): "CKTOTAL", "CKMB", "CKMBINDEX", "TROPONINI" in the last 168 hours. BNP (last 3 results) No results for input(s): "PROBNP" in the last 8760 hours. HbA1C: No results for input(s): "HGBA1C" in the last 72 hours. CBG: Recent Labs  Lab 06/14/22 1139  GLUCAP 278*   Lipid Profile: No results for input(s): "CHOL", "HDL", "LDLCALC", "TRIG", "CHOLHDL", "LDLDIRECT" in the last 72 hours. Thyroid Function Tests: No results for input(s): "TSH", "T4TOTAL", "FREET4", "T3FREE", "THYROIDAB" in the last 72 hours. Anemia Panel: No results for input(s): "VITAMINB12", "FOLATE", "FERRITIN", "TIBC", "IRON", "RETICCTPCT" in the last 72 hours. Sepsis Labs: Recent Labs  Lab 06/14/22 1233 06/14/22 1413  LATICACIDVEN >9.0* 8.9*  No results found for this or any previous visit (from the past 240 hour(s)).       Radiology Studies: CT Head Wo Contrast  Result Date: 06/14/2022 CLINICAL DATA:  Altered mental status with no neoplasm EXAM: CT HEAD WITHOUT CONTRAST TECHNIQUE: Contiguous axial images were obtained from the base of the skull through the vertex without intravenous contrast. RADIATION DOSE REDUCTION: This exam was performed according  to the departmental dose-optimization program which includes automated exposure control, adjustment of the mA and/or kV according to patient size and/or use of iterative reconstruction technique. COMPARISON:  05/22/2022, 05/17/2022, 05/14/2022 FINDINGS: Brain: Redemonstrated hyperdense mass in the right basal ganglia, which measures 3.2 x 2.7 x 2.9 cm (AP x TR x CC), previously 2.5 x 2.6 x 2.5 cm on 05/22/2022. New low-density within the mass, which may represent necrosis. Increased surrounding edema and mass effect on the right lateral ventricle and third ventricle without significant increase in the size of the left lateral ventricle approximately 7 mm of right-to-left midline shift, similar to prior. Additional 0.7 cm lesion in the right cerebellum (series 2, image 8) with minimal surrounding edema and no significant mass effect on the fourth ventricle, similar to prior. Left mesial temporal lobe mass measures approximately 2.0 cm (series 2, image 11), with surrounding edema that appears slightly increased compared to the prior exam, although this lesion is less well-defined on both the current and prior exam. Additional lesions seen on MRI are not well defined on noncontrast CT. No definite new lesion is seen. Hyperdensity adjacent to the mass and along the biopsy tract is favored to represent calcifications related to the biopsy. No extra-axial collection. Vascular: No hyperdense vessel. Skull: Right frontal burr hole. Negative for fracture or focal lesion. Sinuses/Orbits: No acute finding. Other: The mastoids are well aerated. IMPRESSION: 1. Interval increase in the size of the hyperdense mass in the right basal ganglia, with increased surrounding edema, effacement of the right lateral ventricle and third ventricle, and unchanged right-to-left midline shift. 2. Additional left temporal and right cerebellar lesions appear similar to the prior exam. Other enhancing foci seen on the prior MRI are not well  delineated on CT. Electronically Signed   By: Merilyn Baba M.D.   On: 06/14/2022 12:37        Scheduled Meds:  Continuous Infusions:    LOS: 2 days   Time spent= 35 mins    Emylee Decelle Arsenio Loader, MD [facility]  If 7PM-7AM, please contact night-coverage  06/16/2022, 8:31 AM

## 2022-06-17 NOTE — Progress Notes (Signed)
PROGRESS NOTE    April Saunders  WER:[id number] DOB: 09-20-1956 DOA: 06/14/2022 PCP: Willene Hatchet, NP   Brief Narrative:  66 year old with history of HTN, HLD, large B-cell lymphoma of brain presented with altered mental status.  Patient has significant metastases with edema and right-to-left midline shift.  Patient has been made comfort care.   Assessment & Plan:  Principal Problem:   Seizure ([place]) Active Problems:   Hypertension   Dyslipidemia   Prediabetes   Brain tumor ([place])   Acute metabolic encephalopathy   B-cell lymphoma (HCC)   Hyponatremia   Lactic acidosis   Acute metabolic encephalopathy Primary diffuse large B-cell lymphoma of CNS Cerebral edema with midline shift right to left Essential hypertension Hyperlipidemia  -Patient has been made comfort care at this time.  Appropriate orders in place. Anticipate in hospital death. If does ok for next 24 hrs we will start considering hospice home.    DVT prophylaxis: None Code Status: DNR Family Communication:  Family at bedside.   Status is: Inpatient Remains inpatient appropriate because:     Subjective: Remains unresponsive. Family at bedside.  Examination: Constitutional: Remains unresponsive.  Chronically ill. Pupils are not reactive to light. No respose to stimuli.  Respiratory: Clear to auscultation bilaterally Cardiovascular: Normal sinus rhythm, no rubs Abdomen: Nontender nondistended good bowel sounds Musculoskeletal: No edema noted Skin: No rashes seen Neurologic: Unable to assess Psychiatric: Unable to assess  Objective: Vitals:   06/14/22 2052 06/15/22 1531 06/16/22 0648 06/17/22 0533  BP: 101/72 (!) 111/59 128/74 121/74  Pulse: (!) 102 (!) 111 (!) 109 89  Resp: '16 20 18 16  '$ Temp: 97.8 F (36.6 C) 99.2 F (37.3 C) 98.8 F (37.1 C) 98 F (36.7 C)  TempSrc: Axillary Oral Oral Oral  SpO2: 98% 93% 99% 98%  Weight:      Height:        Intake/Output Summary (Last 24 hours) at  06/17/2022 1345 Last data filed at 06/17/2022 1100 Gross per 24 hour  Intake 0 ml  Output 300 ml  Net -300 ml    Filed Weights   06/14/22 1133  Weight: 58 kg     Data Reviewed:   CBC: Recent Labs  Lab 06/14/22 1142  WBC 13.6*  NEUTROABS 11.6*  HGB 14.0  HCT 41.6  MCV 79.5*  PLT 195   Basic Metabolic Panel: Recent Labs  Lab 06/14/22 1142  NA 122*  K 3.7  CL 84*  CO2 11*  GLUCOSE 278*  BUN 15  CREATININE 0.87  CALCIUM 9.9   GFR: Estimated Creatinine Clearance: 53.3 mL/min (by C-G formula based on SCr of 0.87 mg/dL). Liver Function Tests: Recent Labs  Lab 06/14/22 1142  AST 39  ALT 37  ALKPHOS 62  BILITOT 1.5*  PROT 7.0  ALBUMIN 3.4*   No results for input(s): "LIPASE", "AMYLASE" in the last 168 hours. No results for input(s): "AMMONIA" in the last 168 hours. Coagulation Profile: No results for input(s): "INR", "PROTIME" in the last 168 hours. Cardiac Enzymes: No results for input(s): "CKTOTAL", "CKMB", "CKMBINDEX", "TROPONINI" in the last 168 hours. BNP (last 3 results) No results for input(s): "PROBNP" in the last 8760 hours. HbA1C: No results for input(s): "HGBA1C" in the last 72 hours. CBG: Recent Labs  Lab 06/14/22 1139  GLUCAP 278*   Lipid Profile: No results for input(s): "CHOL", "HDL", "LDLCALC", "TRIG", "CHOLHDL", "LDLDIRECT" in the last 72 hours. Thyroid Function Tests: No results for input(s): "TSH", "T4TOTAL", "FREET4", "T3FREE", "THYROIDAB" in the last 72  hours. Anemia Panel: No results for input(s): "VITAMINB12", "FOLATE", "FERRITIN", "TIBC", "IRON", "RETICCTPCT" in the last 72 hours. Sepsis Labs: Recent Labs  Lab 06/14/22 1233 06/14/22 1413  LATICACIDVEN >9.0* 8.9*    No results found for this or any previous visit (from the past 240 hour(s)).       Radiology Studies: No results found.      Scheduled Meds:  Continuous Infusions:    LOS: 3 days   Time spent= 35 mins    Nayef [employer] Arsenio Loader, MD [facility]  If 7PM-7AM, please contact night-coverage  06/17/2022, 1:45 PM

## 2022-06-18 NOTE — TOC Progression Note (Signed)
Transition of Care [facility]) - Progression Note    Patient Details  Name: April Saunders MRN: [id number] Date of Birth: Oct 23, 1956  Transition of Care [facility]) CM/SW Contact  Ross Ludwig, [place] Phone Number: 06/18/2022, 10:08 AM  Clinical Narrative:     CSW spoke to patient's daughter [name] [phone] to provide choice for residential  hospice.  Per patient's daughter they would like [facility].  CSW gave referral to [place] at [facility].  She will review and let CSW know if bed is available and if she qualifies.       Expected Discharge Plan and Services                                                 Social Determinants of Health (SDOH) Interventions    Readmission Risk Interventions    05/31/2022    9:28 AM  Readmission Risk Prevention Plan  Transportation Screening Complete  PCP or Specialist Appt within 5-7 Days Complete  Home Care Screening Complete  Medication Review (RN CM) Referral to Pharmacy

## 2022-06-18 NOTE — TOC Transition Note (Signed)
Transition of Care [facility]) - CM/SW Discharge Note   Patient Details  Name: April Saunders MRN: [id number] Date of Birth: [birth year]  Transition of Care [facility]) CM/SW Contact:  Ross Ludwig, LCSW Phone Number: 06/18/2022, 12:30 PM   Clinical Narrative:     CSW was informed by Lenna Sciara from [place] that patient has been approved for [facility] and they have a bed available today.  Patient to be d/c'ed today to [facility].  Patient and family agreeable to plans will transport via ems RN to call report [phone].    Final next level of care: [place] Barriers to Discharge: Barriers Resolved   Patient Goals and CMS Choice Patient states their goals for this hospitalization and ongoing recovery are:: To go to [facility] for end of life care. CMS Medicare.gov Compare Post Acute Care list provided to:: Patient Represenative (must comment) Choice offered to / list presented to : Adult Children  Discharge Placement              Patient chooses bed at: Other - please specify in the comment section below: ([place]) Patient to be transferred to facility by: [place] Name of family member notified: Felicia Patient and family notified of of transfer: 06/18/22  Discharge Plan and Services                                     Social Determinants of Health (SDOH) Interventions     Readmission Risk Interventions    05/31/2022    9:28 AM  Readmission Risk Prevention Plan  Transportation Screening Complete  PCP or Specialist Appt within 5-7 Days Complete  Home Care Screening Complete  Medication Review (RN CM) Referral to Pharmacy

## 2022-06-18 NOTE — Progress Notes (Signed)
Report was called and given to [place] at [facility].

## 2022-06-18 NOTE — Progress Notes (Signed)
Engineer, maintenance [facility]) Hospital Liaison note.   Received request from [place] for family interest in [facility] with request for transfer  today. Chart reviewed and eligibility confirmed.   Met with patient and family to confirm interest and explain services. Family agreeable to transfer today. CSW aware.   Registration paperwork will be completed by [facility]. Dr. Orpah Melter to assume care per family request.    RN please call report to [phone]. Please arrange transport for patient once consents have been completed.   Thank you,   Clementeen Hoof, DNP, RN  [place] (listed on AMION under Hospice and [facility] [place]   [phone]

## 2022-06-18 NOTE — Discharge Summary (Signed)
Physician Discharge Summary  April Saunders PXT:[id number] DOB: 1956/07/21 DOA: 06/14/2022  PCP: Willene Hatchet, NP  Admit date: 06/14/2022 Discharge date: 06/18/2022  Admitted From: Home Disposition:  Hospice  Recommendations for Outpatient Follow-up:  Hospice    Discharge Condition: Stable CODE STATUS: DNR Diet recommendation: Comfort Feeds  Brief/Interim Summary: 66 year old with history of HTN, HLD, large B-cell lymphoma of brain presented with altered mental status.  Patient has significant metastases with edema and right-to-left midline shift.  Patient has been made comfort care.  Transfer to hospice facility.       Acute metabolic encephalopathy Primary diffuse large B-cell lymphoma of CNS Cerebral edema with midline shift right to left Essential hypertension Hyperlipidemia   -Patient has been made comfort care at this time.  Appropriate orders in place. We will make arrangements for hospice home when available.   DVT prophylaxis: None   Assessment and Plan: No notes have been filed under this hospital service. Service: Hospitalist      Body mass index is 22.65 kg/m.       Discharge Diagnoses:  Principal Problem:   Seizure ([place]) Active Problems:   Hypertension   Dyslipidemia   Prediabetes   Brain tumor ([place])   Acute metabolic encephalopathy   B-cell lymphoma ([facility])   Hyponatremia   Lactic acidosis      Consultations: Palliative   Subjective: Remains unresponsive.   Discharge Exam: Vitals:   06/17/22 0533 06/18/22 0638  BP: 121/74 (!) 156/91  Pulse: 89 (!) 103  Resp: 16 18  Temp: 98 F (36.7 C) (!) 97.5 F (36.4 C)  SpO2: 98% 100%   Vitals:   06/15/22 1531 06/16/22 0648 06/17/22 0533 06/18/22 0638  BP: (!) 111/59 128/74 121/74 (!) 156/91  Pulse: (!) 111 (!) 109 89 (!) 103  Resp: '20 18 16 18  '$ Temp: 99.2 F (37.3 C) 98.8 F (37.1 C) 98 F (36.7 C) (!) 97.5 F (36.4 C)  TempSrc: Oral Oral Oral Oral  SpO2: 93% 99% 98% 100%   Weight:      Height:        General:not in acute distress Cardiovascular: RRR, S1/S2 +, no rubs, no gallops Respiratory: CTA bilaterally, no wheezing, no rhonchi Abdominal: Soft, NT, ND, bowel sounds + Extremities: no edema, no cyanosis  Discharge Instructions   Allergies as of 06/18/2022       Reactions   Atorvastatin Calcium Itching   Chicken Allergy Nausea And Vomiting   Lipitor [atorvastatin] Other (See Comments)   Weakness   Lisinopril Other (See Comments)   Shoulder pain   Pork-derived Products Nausea And Vomiting        Medication List     STOP taking these medications    acetaminophen 500 MG tablet Commonly known as: TYLENOL   amLODipine 5 MG tablet Commonly known as: NORVASC   carbamazepine 200 MG tablet Commonly known as: TEGretol   dexamethasone 6 MG tablet Commonly known as: DECADRON   ferrous sulfate 325 (65 FE) MG tablet   fish oil-omega-3 fatty acids 1000 MG capsule   levETIRAcetam 500 MG tablet Commonly known as: KEPPRA   mirtazapine 7.5 MG tablet Commonly known as: REMERON   pantoprazole 40 MG tablet Commonly known as: PROTONIX   simvastatin 20 MG tablet Commonly known as: ZOCOR   Vitamin D (Ergocalciferol) 1.25 MG (50000 UNIT) Caps capsule Commonly known as: DRISDOL        Allergies  Allergen Reactions   Atorvastatin Calcium Itching   Chicken Allergy Nausea And Vomiting  Lipitor [Atorvastatin] Other (See Comments)    Weakness    Lisinopril Other (See Comments)    Shoulder pain   Pork-Derived Products Nausea And Vomiting    You were cared for by a hospitalist during your hospital stay. If you have any questions about your discharge medications or the care you received while you were in the hospital after you are discharged, you can call the unit and asked to speak with the hospitalist on call if the hospitalist that took care of you is not available. Once you are discharged, your primary care physician will handle any  further medical issues. Please note that no refills for any discharge medications will be authorized once you are discharged, as it is imperative that you return to your primary care physician (or establish a relationship with a primary care physician if you do not have one) for your aftercare needs so that they can reassess your need for medications and monitor your lab values.   Procedures/Studies: CT Head Wo Contrast  Result Date: 06/14/2022 CLINICAL DATA:  Altered mental status with no neoplasm EXAM: CT HEAD WITHOUT CONTRAST TECHNIQUE: Contiguous axial images were obtained from the base of the skull through the vertex without intravenous contrast. RADIATION DOSE REDUCTION: This exam was performed according to the departmental dose-optimization program which includes automated exposure control, adjustment of the mA and/or kV according to patient size and/or use of iterative reconstruction technique. COMPARISON:  05/22/2022, 05/17/2022, 05/14/2022 FINDINGS: Brain: Redemonstrated hyperdense mass in the right basal ganglia, which measures 3.2 x 2.7 x 2.9 cm (AP x TR x CC), previously 2.5 x 2.6 x 2.5 cm on 05/22/2022. New low-density within the mass, which may represent necrosis. Increased surrounding edema and mass effect on the right lateral ventricle and third ventricle without significant increase in the size of the left lateral ventricle approximately 7 mm of right-to-left midline shift, similar to prior. Additional 0.7 cm lesion in the right cerebellum (series 2, image 8) with minimal surrounding edema and no significant mass effect on the fourth ventricle, similar to prior. Left mesial temporal lobe mass measures approximately 2.0 cm (series 2, image 11), with surrounding edema that appears slightly increased compared to the prior exam, although this lesion is less well-defined on both the current and prior exam. Additional lesions seen on MRI are not well defined on noncontrast CT. No definite new lesion  is seen. Hyperdensity adjacent to the mass and along the biopsy tract is favored to represent calcifications related to the biopsy. No extra-axial collection. Vascular: No hyperdense vessel. Skull: Right frontal burr hole. Negative for fracture or focal lesion. Sinuses/Orbits: No acute finding. Other: The mastoids are well aerated. IMPRESSION: 1. Interval increase in the size of the hyperdense mass in the right basal ganglia, with increased surrounding edema, effacement of the right lateral ventricle and third ventricle, and unchanged right-to-left midline shift. 2. Additional left temporal and right cerebellar lesions appear similar to the prior exam. Other enhancing foci seen on the prior MRI are not well delineated on CT. Electronically Signed   By: Merilyn Baba M.D.   On: 06/14/2022 12:37   CT HEAD WO CONTRAST (5MM)  Result Date: 05/22/2022 CLINICAL DATA:  Recent diagnosis of B-cell lymphoma. Prior biopsy. Mental status changes. EXAM: CT HEAD WITHOUT CONTRAST TECHNIQUE: Contiguous axial images were obtained from the base of the skull through the vertex without intravenous contrast. RADIATION DOSE REDUCTION: This exam was performed according to the departmental dose-optimization program which includes automated exposure control, adjustment of  the mA and/or kV according to patient size and/or use of iterative reconstruction technique. COMPARISON:  05/17/2022 FINDINGS: Brain: Today's study suffers from considerable motion degradation. There is resolution of the previously seen edema in the right cerebellum. Mass lesion at the inferior basal ganglia on the right with intrinsic hyperdensity measures 2.6 cm in diameter. No evidence of hemorrhagic complication post biopsy. Regional edema persists relating to this lesion. Previously see lesion of the left temporal lobe is not discernible. Right-to-left shift is approximately 7 mm. No evidence of obstructive hydrocephalus. Vascular: No vascular finding. Skull:  Negative otherwise. Sinuses/Orbits: Clear/normal Other: None IMPRESSION: Marked motion degradation. No additional complication evident following biopsy of the inferior basal ganglia lesion on the right. Maximal measurement today is 2.6 cm. The study is markedly degraded by motion and assessment for subtle change is not possible. Regional edema on the right causes mass effect with right-to-left shift estimated at 7 mm, similar to the prior exam. No visible edema in the inferior cerebellum on the right or in the left temporal lobe. Electronically Signed   By: Nelson Chimes M.D.   On: 05/22/2022 12:59     The results of significant diagnostics from this hospitalization (including imaging, microbiology, ancillary and laboratory) are listed below for reference.     Microbiology: No results found for this or any previous visit (from the past 240 hour(s)).   Labs: BNP (last 3 results) No results for input(s): "BNP" in the last 8760 hours. Basic Metabolic Panel: Recent Labs  Lab 06/14/22 1142  NA 122*  K 3.7  CL 84*  CO2 11*  GLUCOSE 278*  BUN 15  CREATININE 0.87  CALCIUM 9.9   Liver Function Tests: Recent Labs  Lab 06/14/22 1142  AST 39  ALT 37  ALKPHOS 62  BILITOT 1.5*  PROT 7.0  ALBUMIN 3.4*   No results for input(s): "LIPASE", "AMYLASE" in the last 168 hours. No results for input(s): "AMMONIA" in the last 168 hours. CBC: Recent Labs  Lab 06/14/22 1142  WBC 13.6*  NEUTROABS 11.6*  HGB 14.0  HCT 41.6  MCV 79.5*  PLT 186   Cardiac Enzymes: No results for input(s): "CKTOTAL", "CKMB", "CKMBINDEX", "TROPONINI" in the last 168 hours. BNP: Invalid input(s): "POCBNP" CBG: Recent Labs  Lab 06/14/22 1139  GLUCAP 278*   D-Dimer No results for input(s): "DDIMER" in the last 72 hours. Hgb A1c No results for input(s): "HGBA1C" in the last 72 hours. Lipid Profile No results for input(s): "CHOL", "HDL", "LDLCALC", "TRIG", "CHOLHDL", "LDLDIRECT" in the last 72 hours. Thyroid  function studies No results for input(s): "TSH", "T4TOTAL", "T3FREE", "THYROIDAB" in the last 72 hours.  Invalid input(s): "FREET3" Anemia work up No results for input(s): "VITAMINB12", "FOLATE", "FERRITIN", "TIBC", "IRON", "RETICCTPCT" in the last 72 hours. Urinalysis    Component Value Date/Time   COLORURINE YELLOW 06/14/2022 1731   APPEARANCEUR CLEAR 06/14/2022 1731   LABSPEC <1.005 (L) 06/14/2022 1731   PHURINE 6.0 06/14/2022 1731   GLUCOSEU 250 (A) 06/14/2022 1731   HGBUR NEGATIVE 06/14/2022 1731   BILIRUBINUR NEGATIVE 06/14/2022 1731   BILIRUBINUR neg 06/14/2013 1028   KETONESUR NEGATIVE 06/14/2022 1731   PROTEINUR NEGATIVE 06/14/2022 1731   UROBILINOGEN 0.2 06/14/2013 1028   NITRITE NEGATIVE 06/14/2022 1731   LEUKOCYTESUR NEGATIVE 06/14/2022 1731   Sepsis Labs Recent Labs  Lab 06/14/22 1142  WBC 13.6*   Microbiology No results found for this or any previous visit (from the past 240 hour(s)).   Time coordinating discharge:  I have  spent 35 minutes face to face with the patient and on the ward discussing the patients care, assessment, plan and disposition with other care givers. >50% of the time was devoted counseling the patient about the risks and benefits of treatment/Discharge disposition and coordinating care.   SIGNED:   Damita Lack, MD  [facility] 06/18/2022, 1:02 PM   If 7PM-7AM, please contact night-coverage

## 2022-06-18 NOTE — Care Management Important Message (Signed)
Important Message  Patient Details IM Letter placed in Patients room. Name: April Saunders MRN: [id number] Date of Birth: 1955-12-09   Medicare Important Message Given:  Yes     Kerin Salen 06/18/2022, 9:10 AM

## 2022-06-18 NOTE — Progress Notes (Signed)
PROGRESS NOTE    April Saunders  UKG:[id number] DOB: [birth year] DOA: 06/14/2022 PCP: Willene Hatchet, NP   Brief Narrative:  66 year old with history of HTN, HLD, large B-cell lymphoma of brain presented with altered mental status.  Patient has significant metastases with edema and right-to-left midline shift.  Patient has been made comfort care.  Currently awaiting hospice facility placement.   Assessment & Plan:  Principal Problem:   Seizure ([place]) Active Problems:   Hypertension   Dyslipidemia   Prediabetes   Brain tumor ([place])   Acute metabolic encephalopathy   B-cell lymphoma ([facility])   Hyponatremia   Lactic acidosis   Acute metabolic encephalopathy Primary diffuse large B-cell lymphoma of CNS Cerebral edema with midline shift right to left Essential hypertension Hyperlipidemia  -Patient has been made comfort care at this time.  Appropriate orders in place. We will make arrangements for hospice home when available.  DVT prophylaxis: None Code Status: DNR Family Communication:  Family at bedside.   Status is: Inpatient Remains inpatient appropriate because: TOC team working on hospice home placement    Subjective: Minimally responsive, mumbles to her name.  Otherwise no meaningful conversation.  Examination: Constitutional: Remains unresponsive for the most part.  No meaningful interaction..  Chronically ill. Pupils are not reactive to light.  Respiratory: Clear to auscultation bilaterally Cardiovascular: Normal sinus rhythm, no rubs Abdomen: Nontender nondistended good bowel sounds Musculoskeletal: No edema noted Skin: No rashes seen Neurologic: Unable to assess Psychiatric: Unable to assess  Objective: Vitals:   06/15/22 1531 06/16/22 0648 06/17/22 0533 06/18/22 0638  BP: (!) 111/59 128/74 121/74 (!) 156/91  Pulse: (!) 111 (!) 109 89 (!) 103  Resp: '20 18 16 18  '$ Temp: 99.2 F (37.3 C) 98.8 F (37.1 C) 98 F (36.7 C) (!) 97.5 F (36.4 C)  TempSrc: Oral  Oral Oral Oral  SpO2: 93% 99% 98% 100%  Weight:      Height:        Intake/Output Summary (Last 24 hours) at 06/18/2022 1102 Last data filed at 06/18/2022 0659 Gross per 24 hour  Intake --  Output 400 ml  Net -400 ml    Filed Weights   06/14/22 1133  Weight: 58 kg     Data Reviewed:   CBC: Recent Labs  Lab 06/14/22 1142  WBC 13.6*  NEUTROABS 11.6*  HGB 14.0  HCT 41.6  MCV 79.5*  PLT 762   Basic Metabolic Panel: Recent Labs  Lab 06/14/22 1142  NA 122*  K 3.7  CL 84*  CO2 11*  GLUCOSE 278*  BUN 15  CREATININE 0.87  CALCIUM 9.9   GFR: Estimated Creatinine Clearance: 53.3 mL/min (by C-G formula based on SCr of 0.87 mg/dL). Liver Function Tests: Recent Labs  Lab 06/14/22 1142  AST 39  ALT 37  ALKPHOS 62  BILITOT 1.5*  PROT 7.0  ALBUMIN 3.4*   No results for input(s): "LIPASE", "AMYLASE" in the last 168 hours. No results for input(s): "AMMONIA" in the last 168 hours. Coagulation Profile: No results for input(s): "INR", "PROTIME" in the last 168 hours. Cardiac Enzymes: No results for input(s): "CKTOTAL", "CKMB", "CKMBINDEX", "TROPONINI" in the last 168 hours. BNP (last 3 results) No results for input(s): "PROBNP" in the last 8760 hours. HbA1C: No results for input(s): "HGBA1C" in the last 72 hours. CBG: Recent Labs  Lab 06/14/22 1139  GLUCAP 278*   Lipid Profile: No results for input(s): "CHOL", "HDL", "LDLCALC", "TRIG", "CHOLHDL", "LDLDIRECT" in the last 72 hours. Thyroid Function Tests:  No results for input(s): "TSH", "T4TOTAL", "FREET4", "T3FREE", "THYROIDAB" in the last 72 hours. Anemia Panel: No results for input(s): "VITAMINB12", "FOLATE", "FERRITIN", "TIBC", "IRON", "RETICCTPCT" in the last 72 hours. Sepsis Labs: Recent Labs  Lab 06/14/22 1233 06/14/22 1413  LATICACIDVEN >9.0* 8.9*    No results found for this or any previous visit (from the past 240 hour(s)).       Radiology Studies: No results found.      Scheduled  Meds:  Continuous Infusions:    LOS: 4 days   Time spent= 35 mins    Lexie Morini Arsenio Loader, MD [facility]  If 7PM-7AM, please contact night-coverage  06/18/2022, 11:02 AM

## 2022-07-13 DEATH — deceased
